# Patient Record
Sex: Female | Born: 1945 | Race: Black or African American | Hispanic: No | Marital: Married | Smoking: Former smoker
Health system: Southern US, Community
[De-identification: ages and names within clinical notes are randomized; demographics above are authoritative.]

## PROBLEM LIST (undated history)

## (undated) DIAGNOSIS — Z1509 Genetic susceptibility to other malignant neoplasm: Secondary | ICD-10-CM

## (undated) DIAGNOSIS — Z1501 Genetic susceptibility to malignant neoplasm of breast: Secondary | ICD-10-CM

## (undated) DIAGNOSIS — Z853 Personal history of malignant neoplasm of breast: Secondary | ICD-10-CM

## (undated) DIAGNOSIS — H409 Unspecified glaucoma: Secondary | ICD-10-CM

## (undated) DIAGNOSIS — Z87440 Personal history of urinary (tract) infections: Secondary | ICD-10-CM

## (undated) DIAGNOSIS — E785 Hyperlipidemia, unspecified: Secondary | ICD-10-CM

## (undated) DIAGNOSIS — M8580 Other specified disorders of bone density and structure, unspecified site: Secondary | ICD-10-CM

## (undated) DIAGNOSIS — I10 Essential (primary) hypertension: Secondary | ICD-10-CM

## (undated) HISTORY — PX: OTHER SURGICAL HISTORY: SHX169

## (undated) HISTORY — PX: ECTOPIC PREGNANCY SURGERY: SHX613

## (undated) HISTORY — DX: Other specified disorders of bone density and structure, unspecified site: M85.80

## (undated) HISTORY — DX: Essential (primary) hypertension: I10

## (undated) HISTORY — PX: CATARACT EXTRACTION: SUR2

## (undated) HISTORY — DX: Genetic susceptibility to other malignant neoplasm: Z15.09

## (undated) HISTORY — PX: HAND SURGERY: SHX662

## (undated) HISTORY — DX: Genetic susceptibility to malignant neoplasm of breast: Z15.01

---

## 1966-11-24 HISTORY — PX: DILATION AND CURETTAGE OF UTERUS: SHX78

## 1998-04-04 ENCOUNTER — Encounter: Admission: RE | Admit: 1998-04-04 | Discharge: 1998-07-03 | Payer: Self-pay | Admitting: Gynecology

## 1999-02-08 ENCOUNTER — Other Ambulatory Visit: Admission: RE | Admit: 1999-02-08 | Discharge: 1999-02-08 | Payer: Self-pay | Admitting: Obstetrics and Gynecology

## 1999-11-25 HISTORY — PX: EYE SURGERY: SHX253

## 2000-02-10 ENCOUNTER — Other Ambulatory Visit: Admission: RE | Admit: 2000-02-10 | Discharge: 2000-02-10 | Payer: Self-pay | Admitting: Obstetrics and Gynecology

## 2000-07-06 ENCOUNTER — Encounter (INDEPENDENT_AMBULATORY_CARE_PROVIDER_SITE_OTHER): Payer: Self-pay | Admitting: Specialist

## 2000-07-06 ENCOUNTER — Other Ambulatory Visit: Admission: RE | Admit: 2000-07-06 | Discharge: 2000-07-06 | Payer: Self-pay | Admitting: Obstetrics and Gynecology

## 2000-10-28 ENCOUNTER — Encounter (INDEPENDENT_AMBULATORY_CARE_PROVIDER_SITE_OTHER): Payer: Self-pay

## 2000-10-28 ENCOUNTER — Ambulatory Visit (HOSPITAL_COMMUNITY): Admission: RE | Admit: 2000-10-28 | Discharge: 2000-10-28 | Payer: Self-pay | Admitting: Obstetrics and Gynecology

## 2001-02-10 ENCOUNTER — Other Ambulatory Visit: Admission: RE | Admit: 2001-02-10 | Discharge: 2001-02-10 | Payer: Self-pay | Admitting: Obstetrics and Gynecology

## 2002-05-12 ENCOUNTER — Other Ambulatory Visit: Admission: RE | Admit: 2002-05-12 | Discharge: 2002-05-12 | Payer: Self-pay | Admitting: Obstetrics and Gynecology

## 2002-08-16 ENCOUNTER — Ambulatory Visit (HOSPITAL_COMMUNITY): Admission: RE | Admit: 2002-08-16 | Discharge: 2002-08-16 | Payer: Self-pay | Admitting: Gastroenterology

## 2003-05-17 ENCOUNTER — Other Ambulatory Visit: Admission: RE | Admit: 2003-05-17 | Discharge: 2003-05-17 | Payer: Self-pay | Admitting: Obstetrics and Gynecology

## 2004-05-22 ENCOUNTER — Other Ambulatory Visit: Admission: RE | Admit: 2004-05-22 | Discharge: 2004-05-22 | Payer: Self-pay | Admitting: Obstetrics and Gynecology

## 2005-05-29 ENCOUNTER — Other Ambulatory Visit: Admission: RE | Admit: 2005-05-29 | Discharge: 2005-05-29 | Payer: Self-pay | Admitting: Addiction Medicine

## 2005-08-28 ENCOUNTER — Encounter: Admission: RE | Admit: 2005-08-28 | Discharge: 2005-08-28 | Payer: Self-pay | Admitting: Surgery

## 2005-08-29 ENCOUNTER — Encounter (INDEPENDENT_AMBULATORY_CARE_PROVIDER_SITE_OTHER): Payer: Self-pay | Admitting: *Deleted

## 2005-08-29 ENCOUNTER — Ambulatory Visit (HOSPITAL_COMMUNITY): Admission: RE | Admit: 2005-08-29 | Discharge: 2005-08-29 | Payer: Self-pay | Admitting: Surgery

## 2005-08-29 ENCOUNTER — Ambulatory Visit (HOSPITAL_BASED_OUTPATIENT_CLINIC_OR_DEPARTMENT_OTHER): Admission: RE | Admit: 2005-08-29 | Discharge: 2005-08-30 | Payer: Self-pay | Admitting: Surgery

## 2005-08-29 HISTORY — PX: BREAST SURGERY: SHX581

## 2005-09-04 DIAGNOSIS — C50312 Malignant neoplasm of lower-inner quadrant of left female breast: Secondary | ICD-10-CM

## 2005-09-15 ENCOUNTER — Ambulatory Visit: Payer: Self-pay | Admitting: Oncology

## 2005-10-06 ENCOUNTER — Ambulatory Visit (HOSPITAL_COMMUNITY): Admission: RE | Admit: 2005-10-06 | Discharge: 2005-10-06 | Payer: Self-pay | Admitting: Oncology

## 2005-10-07 ENCOUNTER — Ambulatory Visit (HOSPITAL_BASED_OUTPATIENT_CLINIC_OR_DEPARTMENT_OTHER): Admission: RE | Admit: 2005-10-07 | Discharge: 2005-10-07 | Payer: Self-pay | Admitting: Surgery

## 2005-10-07 ENCOUNTER — Ambulatory Visit (HOSPITAL_COMMUNITY): Admission: RE | Admit: 2005-10-07 | Discharge: 2005-10-07 | Payer: Self-pay | Admitting: Surgery

## 2005-10-13 ENCOUNTER — Ambulatory Visit (HOSPITAL_COMMUNITY): Admission: RE | Admit: 2005-10-13 | Discharge: 2005-10-13 | Payer: Self-pay | Admitting: Oncology

## 2005-10-14 ENCOUNTER — Encounter: Payer: Self-pay | Admitting: Cardiology

## 2005-10-14 ENCOUNTER — Ambulatory Visit: Payer: Self-pay

## 2005-10-14 HISTORY — PX: TRANSTHORACIC ECHOCARDIOGRAM: SHX275

## 2005-11-07 ENCOUNTER — Ambulatory Visit: Payer: Self-pay | Admitting: Oncology

## 2005-11-24 HISTORY — PX: CARPAL TUNNEL RELEASE: SHX101

## 2005-12-23 ENCOUNTER — Ambulatory Visit: Payer: Self-pay | Admitting: Oncology

## 2006-02-10 ENCOUNTER — Ambulatory Visit: Payer: Self-pay | Admitting: Oncology

## 2006-03-31 ENCOUNTER — Ambulatory Visit: Payer: Self-pay | Admitting: Oncology

## 2006-04-02 LAB — CBC WITH DIFFERENTIAL/PLATELET
Basophils Absolute: 0 10*3/uL (ref 0.0–0.1)
Eosinophils Absolute: 0.1 10*3/uL (ref 0.0–0.5)
HGB: 11.9 g/dL (ref 11.6–15.9)
MCV: 90.7 fL (ref 81.0–101.0)
MONO#: 0.5 10*3/uL (ref 0.1–0.9)
NEUT#: 2.8 10*3/uL (ref 1.5–6.5)
RDW: 14.8 % — ABNORMAL HIGH (ref 11.3–14.5)
lymph#: 1 10*3/uL (ref 0.9–3.3)

## 2006-04-02 LAB — COMPREHENSIVE METABOLIC PANEL
Albumin: 4.2 g/dL (ref 3.5–5.2)
BUN: 14 mg/dL (ref 6–23)
CO2: 26 mEq/L (ref 19–32)
Calcium: 9.2 mg/dL (ref 8.4–10.5)
Chloride: 109 mEq/L (ref 96–112)
Glucose, Bld: 75 mg/dL (ref 70–99)
Potassium: 3.7 mEq/L (ref 3.5–5.3)

## 2006-04-24 ENCOUNTER — Ambulatory Visit (HOSPITAL_BASED_OUTPATIENT_CLINIC_OR_DEPARTMENT_OTHER): Admission: RE | Admit: 2006-04-24 | Discharge: 2006-04-24 | Payer: Self-pay | Admitting: Surgery

## 2006-06-03 ENCOUNTER — Other Ambulatory Visit: Admission: RE | Admit: 2006-06-03 | Discharge: 2006-06-03 | Payer: Self-pay | Admitting: Obstetrics and Gynecology

## 2006-06-30 ENCOUNTER — Ambulatory Visit: Payer: Self-pay | Admitting: Oncology

## 2006-07-02 LAB — COMPREHENSIVE METABOLIC PANEL
Albumin: 4.6 g/dL (ref 3.5–5.2)
Alkaline Phosphatase: 50 U/L (ref 39–117)
BUN: 15 mg/dL (ref 6–23)
Calcium: 9.5 mg/dL (ref 8.4–10.5)
Glucose, Bld: 88 mg/dL (ref 70–99)
Potassium: 3.9 mEq/L (ref 3.5–5.3)

## 2006-07-02 LAB — CBC WITH DIFFERENTIAL/PLATELET
EOS%: 2.2 % (ref 0.0–7.0)
MCH: 30.1 pg (ref 26.0–34.0)
MCV: 88.7 fL (ref 81.0–101.0)
MONO%: 10.7 % (ref 0.0–13.0)
RBC: 4.08 10*6/uL (ref 3.70–5.32)
RDW: 16.1 % — ABNORMAL HIGH (ref 11.3–14.5)

## 2006-07-02 LAB — CANCER ANTIGEN 27.29: CA 27.29: 23 U/mL (ref 0–39)

## 2006-07-09 ENCOUNTER — Encounter (HOSPITAL_COMMUNITY): Admission: RE | Admit: 2006-07-09 | Discharge: 2006-08-18 | Payer: Self-pay | Admitting: Oncology

## 2006-10-21 ENCOUNTER — Ambulatory Visit: Payer: Self-pay | Admitting: Oncology

## 2006-10-26 LAB — COMPREHENSIVE METABOLIC PANEL
ALT: 22 U/L (ref 0–35)
AST: 19 U/L (ref 0–37)
Albumin: 4.6 g/dL (ref 3.5–5.2)
BUN: 15 mg/dL (ref 6–23)
Calcium: 9.2 mg/dL (ref 8.4–10.5)
Chloride: 106 mEq/L (ref 96–112)
Potassium: 4.2 mEq/L (ref 3.5–5.3)
Total Protein: 7.3 g/dL (ref 6.0–8.3)

## 2006-10-26 LAB — CBC WITH DIFFERENTIAL/PLATELET
BASO%: 0.8 % (ref 0.0–2.0)
HCT: 38.2 % (ref 34.8–46.6)
MCHC: 34.6 g/dL (ref 32.0–36.0)
MONO#: 0.5 10*3/uL (ref 0.1–0.9)
NEUT%: 56.7 % (ref 39.6–76.8)
WBC: 7.1 10*3/uL (ref 3.9–10.0)
lymph#: 2.5 10*3/uL (ref 0.9–3.3)

## 2006-10-26 LAB — CANCER ANTIGEN 27.29: CA 27.29: 28 U/mL (ref 0–39)

## 2006-11-25 ENCOUNTER — Encounter (INDEPENDENT_AMBULATORY_CARE_PROVIDER_SITE_OTHER): Payer: Self-pay | Admitting: Specialist

## 2006-11-25 ENCOUNTER — Encounter: Admission: RE | Admit: 2006-11-25 | Discharge: 2006-11-25 | Payer: Self-pay | Admitting: Oncology

## 2006-11-25 ENCOUNTER — Other Ambulatory Visit: Admission: RE | Admit: 2006-11-25 | Discharge: 2006-11-25 | Payer: Self-pay | Admitting: Interventional Radiology

## 2007-03-16 ENCOUNTER — Ambulatory Visit: Payer: Self-pay | Admitting: Oncology

## 2007-03-19 LAB — COMPREHENSIVE METABOLIC PANEL
AST: 16 U/L (ref 0–37)
Albumin: 4.6 g/dL (ref 3.5–5.2)
BUN: 17 mg/dL (ref 6–23)
Calcium: 9.2 mg/dL (ref 8.4–10.5)
Chloride: 108 mEq/L (ref 96–112)
Potassium: 3.6 mEq/L (ref 3.5–5.3)
Sodium: 142 mEq/L (ref 135–145)
Total Protein: 7.2 g/dL (ref 6.0–8.3)

## 2007-03-19 LAB — CBC WITH DIFFERENTIAL/PLATELET
Basophils Absolute: 0 10*3/uL (ref 0.0–0.1)
EOS%: 1.4 % (ref 0.0–7.0)
Eosinophils Absolute: 0.1 10*3/uL (ref 0.0–0.5)
HGB: 12.5 g/dL (ref 11.6–15.9)
MCH: 30.9 pg (ref 26.0–34.0)
NEUT#: 4.6 10*3/uL (ref 1.5–6.5)
RBC: 4.03 10*6/uL (ref 3.70–5.32)
RDW: 14.4 % (ref 11.3–14.5)
lymph#: 1.8 10*3/uL (ref 0.9–3.3)

## 2007-06-07 ENCOUNTER — Other Ambulatory Visit: Admission: RE | Admit: 2007-06-07 | Discharge: 2007-06-07 | Payer: Self-pay | Admitting: Obstetrics and Gynecology

## 2007-08-05 ENCOUNTER — Encounter: Admission: RE | Admit: 2007-08-05 | Discharge: 2007-08-05 | Payer: Self-pay | Admitting: Oncology

## 2007-09-21 ENCOUNTER — Ambulatory Visit: Payer: Self-pay | Admitting: Oncology

## 2007-09-23 LAB — CBC WITH DIFFERENTIAL/PLATELET
EOS%: 2 % (ref 0.0–7.0)
Eosinophils Absolute: 0.1 10*3/uL (ref 0.0–0.5)
LYMPH%: 29.1 % (ref 14.0–48.0)
MCH: 30.7 pg (ref 26.0–34.0)
MCHC: 34.5 g/dL (ref 32.0–36.0)
MCV: 88.9 fL (ref 81.0–101.0)
MONO%: 9.2 % (ref 0.0–13.0)
NEUT#: 3.8 10*3/uL (ref 1.5–6.5)
Platelets: 187 10*3/uL (ref 145–400)
RBC: 4.21 10*6/uL (ref 3.70–5.32)

## 2007-09-23 LAB — COMPREHENSIVE METABOLIC PANEL
Alkaline Phosphatase: 57 U/L (ref 39–117)
BUN: 12 mg/dL (ref 6–23)
Glucose, Bld: 97 mg/dL (ref 70–99)
Sodium: 143 mEq/L (ref 135–145)
Total Bilirubin: 0.6 mg/dL (ref 0.3–1.2)
Total Protein: 7 g/dL (ref 6.0–8.3)

## 2007-09-23 LAB — CANCER ANTIGEN 27.29: CA 27.29: 24 U/mL (ref 0–39)

## 2008-03-20 ENCOUNTER — Ambulatory Visit: Payer: Self-pay | Admitting: Oncology

## 2008-03-22 LAB — CBC WITH DIFFERENTIAL/PLATELET
Eosinophils Absolute: 0.1 10*3/uL (ref 0.0–0.5)
HCT: 37 % (ref 34.8–46.6)
HGB: 12.7 g/dL (ref 11.6–15.9)
LYMPH%: 31.2 % (ref 14.0–48.0)
MONO#: 0.6 10*3/uL (ref 0.1–0.9)
NEUT#: 4.1 10*3/uL (ref 1.5–6.5)
Platelets: 203 10*3/uL (ref 145–400)
RBC: 4.19 10*6/uL (ref 3.70–5.32)
WBC: 7 10*3/uL (ref 3.9–10.0)

## 2008-03-23 LAB — COMPREHENSIVE METABOLIC PANEL
Albumin: 4.5 g/dL (ref 3.5–5.2)
CO2: 24 mEq/L (ref 19–32)
Glucose, Bld: 85 mg/dL (ref 70–99)
Sodium: 141 mEq/L (ref 135–145)
Total Bilirubin: 0.6 mg/dL (ref 0.3–1.2)
Total Protein: 7.2 g/dL (ref 6.0–8.3)

## 2008-03-23 LAB — CANCER ANTIGEN 27.29: CA 27.29: 34 U/mL (ref 0–39)

## 2008-03-23 LAB — VITAMIN D 25 HYDROXY (VIT D DEFICIENCY, FRACTURES): Vit D, 25-Hydroxy: 31 ng/mL (ref 30–89)

## 2008-06-07 ENCOUNTER — Other Ambulatory Visit: Admission: RE | Admit: 2008-06-07 | Discharge: 2008-06-07 | Payer: Self-pay | Admitting: Obstetrics and Gynecology

## 2008-07-17 ENCOUNTER — Other Ambulatory Visit: Admission: RE | Admit: 2008-07-17 | Discharge: 2008-07-17 | Payer: Self-pay | Admitting: Obstetrics and Gynecology

## 2008-10-16 ENCOUNTER — Ambulatory Visit: Payer: Self-pay | Admitting: Oncology

## 2008-10-16 LAB — CBC WITH DIFFERENTIAL/PLATELET
Basophils Absolute: 0 10*3/uL (ref 0.0–0.1)
Eosinophils Absolute: 0.1 10*3/uL (ref 0.0–0.5)
HCT: 38.7 % (ref 34.8–46.6)
HGB: 13.3 g/dL (ref 11.6–15.9)
MCV: 89.9 fL (ref 81.0–101.0)
NEUT#: 5 10*3/uL (ref 1.5–6.5)
NEUT%: 65.2 % (ref 39.6–76.8)
RDW: 14.3 % (ref 11.3–14.5)
lymph#: 1.9 10*3/uL (ref 0.9–3.3)

## 2008-10-17 LAB — COMPREHENSIVE METABOLIC PANEL
ALT: 17 U/L (ref 0–35)
AST: 18 U/L (ref 0–37)
Albumin: 4.9 g/dL (ref 3.5–5.2)
Alkaline Phosphatase: 50 U/L (ref 39–117)
BUN: 17 mg/dL (ref 6–23)
CO2: 25 mEq/L (ref 19–32)
Calcium: 9.6 mg/dL (ref 8.4–10.5)
Chloride: 107 mEq/L (ref 96–112)
Creatinine, Ser: 0.85 mg/dL (ref 0.40–1.20)
Glucose, Bld: 93 mg/dL (ref 70–99)
Potassium: 3.6 mEq/L (ref 3.5–5.3)
Sodium: 144 mEq/L (ref 135–145)
Total Bilirubin: 0.6 mg/dL (ref 0.3–1.2)
Total Protein: 7.9 g/dL (ref 6.0–8.3)

## 2008-10-17 LAB — CANCER ANTIGEN 27.29: CA 27.29: 36 U/mL (ref 0–39)

## 2008-10-17 LAB — VITAMIN D 25 HYDROXY (VIT D DEFICIENCY, FRACTURES): Vit D, 25-Hydroxy: 48 ng/mL (ref 30–89)

## 2008-10-17 LAB — LACTATE DEHYDROGENASE: LDH: 207 U/L (ref 94–250)

## 2009-04-25 ENCOUNTER — Ambulatory Visit: Payer: Self-pay | Admitting: Oncology

## 2009-04-27 LAB — CBC WITH DIFFERENTIAL/PLATELET
Basophils Absolute: 0 10*3/uL (ref 0.0–0.1)
EOS%: 1.2 % (ref 0.0–7.0)
HCT: 35.4 % (ref 34.8–46.6)
HGB: 12.1 g/dL (ref 11.6–15.9)
MCH: 29.6 pg (ref 25.1–34.0)
MCV: 86.6 fL (ref 79.5–101.0)
MONO%: 8.4 % (ref 0.0–14.0)
NEUT%: 54.1 % (ref 38.4–76.8)

## 2009-04-30 LAB — CANCER ANTIGEN 27.29: CA 27.29: 37 U/mL (ref 0–39)

## 2009-04-30 LAB — COMPREHENSIVE METABOLIC PANEL
Albumin: 4.5 g/dL (ref 3.5–5.2)
Alkaline Phosphatase: 44 U/L (ref 39–117)
BUN: 15 mg/dL (ref 6–23)
CO2: 26 mEq/L (ref 19–32)
Glucose, Bld: 77 mg/dL (ref 70–99)
Potassium: 3.5 mEq/L (ref 3.5–5.3)

## 2009-04-30 LAB — LACTATE DEHYDROGENASE: LDH: 214 U/L (ref 94–250)

## 2009-04-30 LAB — VITAMIN D 25 HYDROXY (VIT D DEFICIENCY, FRACTURES): Vit D, 25-Hydroxy: 46 ng/mL (ref 30–89)

## 2009-06-11 ENCOUNTER — Other Ambulatory Visit: Admission: RE | Admit: 2009-06-11 | Discharge: 2009-06-11 | Payer: Self-pay | Admitting: Obstetrics and Gynecology

## 2009-06-11 ENCOUNTER — Encounter: Payer: Self-pay | Admitting: Obstetrics and Gynecology

## 2009-06-11 ENCOUNTER — Ambulatory Visit: Payer: Self-pay | Admitting: Obstetrics and Gynecology

## 2009-08-28 ENCOUNTER — Ambulatory Visit: Payer: Self-pay | Admitting: Obstetrics and Gynecology

## 2009-10-11 ENCOUNTER — Ambulatory Visit: Payer: Self-pay | Admitting: Obstetrics and Gynecology

## 2009-10-16 ENCOUNTER — Ambulatory Visit: Payer: Self-pay | Admitting: Gynecology

## 2009-11-15 ENCOUNTER — Ambulatory Visit: Payer: Self-pay | Admitting: Oncology

## 2009-11-20 LAB — CBC WITH DIFFERENTIAL/PLATELET
BASO%: 0.3 % (ref 0.0–2.0)
EOS%: 1.5 % (ref 0.0–7.0)
MCH: 29.6 pg (ref 25.1–34.0)
MCHC: 33.3 g/dL (ref 31.5–36.0)
NEUT%: 49.6 % (ref 38.4–76.8)
RBC: 4.29 10*6/uL (ref 3.70–5.45)
RDW: 14.9 % — ABNORMAL HIGH (ref 11.2–14.5)
lymph#: 2.8 10*3/uL (ref 0.9–3.3)
nRBC: 0 % (ref 0–0)

## 2009-11-21 LAB — VITAMIN D 25 HYDROXY (VIT D DEFICIENCY, FRACTURES): Vit D, 25-Hydroxy: 53 ng/mL (ref 30–89)

## 2009-11-21 LAB — COMPREHENSIVE METABOLIC PANEL
ALT: 22 U/L (ref 0–35)
AST: 19 U/L (ref 0–37)
Albumin: 4.4 g/dL (ref 3.5–5.2)
Alkaline Phosphatase: 48 U/L (ref 39–117)
BUN: 14 mg/dL (ref 6–23)
Potassium: 3.7 mEq/L (ref 3.5–5.3)
Sodium: 142 mEq/L (ref 135–145)

## 2009-12-07 ENCOUNTER — Ambulatory Visit: Payer: Self-pay | Admitting: Obstetrics and Gynecology

## 2010-05-24 ENCOUNTER — Ambulatory Visit: Payer: Self-pay | Admitting: Oncology

## 2010-05-29 LAB — COMPREHENSIVE METABOLIC PANEL
ALT: 18 U/L (ref 0–35)
AST: 18 U/L (ref 0–37)
Albumin: 4.5 g/dL (ref 3.5–5.2)
Alkaline Phosphatase: 49 U/L (ref 39–117)
BUN: 16 mg/dL (ref 6–23)
CO2: 24 mEq/L (ref 19–32)
Calcium: 9.7 mg/dL (ref 8.4–10.5)
Chloride: 105 mEq/L (ref 96–112)
Creatinine, Ser: 0.74 mg/dL (ref 0.40–1.20)
Glucose, Bld: 105 mg/dL — ABNORMAL HIGH (ref 70–99)
Potassium: 3.8 mEq/L (ref 3.5–5.3)
Sodium: 138 mEq/L (ref 135–145)
Total Bilirubin: 0.5 mg/dL (ref 0.3–1.2)
Total Protein: 7.3 g/dL (ref 6.0–8.3)

## 2010-05-29 LAB — CBC WITH DIFFERENTIAL/PLATELET
BASO%: 0.3 % (ref 0.0–2.0)
Basophils Absolute: 0 10*3/uL (ref 0.0–0.1)
EOS%: 1.7 % (ref 0.0–7.0)
Eosinophils Absolute: 0.1 10*3/uL (ref 0.0–0.5)
HCT: 37.4 % (ref 34.8–46.6)
HGB: 12.8 g/dL (ref 11.6–15.9)
LYMPH%: 38.9 % (ref 14.0–49.7)
MCH: 30.1 pg (ref 25.1–34.0)
MCHC: 34.2 g/dL (ref 31.5–36.0)
MCV: 88 fL (ref 79.5–101.0)
MONO#: 0.7 10*3/uL (ref 0.1–0.9)
MONO%: 9.3 % (ref 0.0–14.0)
NEUT#: 3.6 10*3/uL (ref 1.5–6.5)
NEUT%: 49.8 % (ref 38.4–76.8)
Platelets: 205 10*3/uL (ref 145–400)
RBC: 4.25 10*6/uL (ref 3.70–5.45)
RDW: 14.8 % — ABNORMAL HIGH (ref 11.2–14.5)
WBC: 7.2 10*3/uL (ref 3.9–10.3)
lymph#: 2.8 10*3/uL (ref 0.9–3.3)

## 2010-05-29 LAB — VITAMIN D 25 HYDROXY (VIT D DEFICIENCY, FRACTURES): Vit D, 25-Hydroxy: 50 ng/mL (ref 30–89)

## 2010-05-29 LAB — CANCER ANTIGEN 27.29: CA 27.29: 30 U/mL (ref 0–39)

## 2010-05-29 LAB — LACTATE DEHYDROGENASE: LDH: 177 U/L (ref 94–250)

## 2010-06-12 ENCOUNTER — Ambulatory Visit: Payer: Self-pay | Admitting: Obstetrics and Gynecology

## 2010-06-12 ENCOUNTER — Other Ambulatory Visit: Admission: RE | Admit: 2010-06-12 | Discharge: 2010-06-12 | Payer: Self-pay | Admitting: Obstetrics and Gynecology

## 2010-11-21 ENCOUNTER — Ambulatory Visit: Payer: Self-pay | Admitting: Oncology

## 2010-11-27 LAB — CBC WITH DIFFERENTIAL/PLATELET
BASO%: 0.5 % (ref 0.0–2.0)
Basophils Absolute: 0 10*3/uL (ref 0.0–0.1)
EOS%: 2.3 % (ref 0.0–7.0)
Eosinophils Absolute: 0.2 10*3/uL (ref 0.0–0.5)
HCT: 36.2 % (ref 34.8–46.6)
HGB: 12.4 g/dL (ref 11.6–15.9)
LYMPH%: 31.1 % (ref 14.0–49.7)
MCH: 31 pg (ref 25.1–34.0)
MCHC: 34.3 g/dL (ref 31.5–36.0)
MCV: 90.1 fL (ref 79.5–101.0)
MONO#: 0.6 10*3/uL (ref 0.1–0.9)
MONO%: 9.4 % (ref 0.0–14.0)
NEUT#: 3.7 10*3/uL (ref 1.5–6.5)
NEUT%: 56.7 % (ref 38.4–76.8)
Platelets: 191 10*3/uL (ref 145–400)
RBC: 4.02 10*6/uL (ref 3.70–5.45)
RDW: 15.5 % — ABNORMAL HIGH (ref 11.2–14.5)
WBC: 6.6 10*3/uL (ref 3.9–10.3)
lymph#: 2 10*3/uL (ref 0.9–3.3)

## 2010-11-28 LAB — COMPREHENSIVE METABOLIC PANEL
ALT: 28 U/L (ref 0–35)
AST: 23 U/L (ref 0–37)
Albumin: 4.5 g/dL (ref 3.5–5.2)
Alkaline Phosphatase: 53 U/L (ref 39–117)
BUN: 12 mg/dL (ref 6–23)
CO2: 26 mEq/L (ref 19–32)
Calcium: 9.2 mg/dL (ref 8.4–10.5)
Chloride: 105 mEq/L (ref 96–112)
Creatinine, Ser: 0.77 mg/dL (ref 0.40–1.20)
Glucose, Bld: 83 mg/dL (ref 70–99)
Potassium: 4.1 mEq/L (ref 3.5–5.3)
Sodium: 140 mEq/L (ref 135–145)
Total Bilirubin: 0.5 mg/dL (ref 0.3–1.2)
Total Protein: 7.5 g/dL (ref 6.0–8.3)

## 2010-11-28 LAB — VITAMIN D 25 HYDROXY (VIT D DEFICIENCY, FRACTURES): Vit D, 25-Hydroxy: 49 ng/mL (ref 30–89)

## 2010-11-28 LAB — CANCER ANTIGEN 27.29: CA 27.29: 35 U/mL (ref 0–39)

## 2010-11-28 LAB — LACTATE DEHYDROGENASE: LDH: 183 U/L (ref 94–250)

## 2010-12-05 ENCOUNTER — Ambulatory Visit
Admission: RE | Admit: 2010-12-05 | Discharge: 2010-12-05 | Payer: Self-pay | Source: Home / Self Care | Attending: Pulmonary Disease | Admitting: Pulmonary Disease

## 2010-12-05 DIAGNOSIS — R0609 Other forms of dyspnea: Secondary | ICD-10-CM | POA: Insufficient documentation

## 2010-12-05 DIAGNOSIS — J309 Allergic rhinitis, unspecified: Secondary | ICD-10-CM | POA: Insufficient documentation

## 2010-12-05 DIAGNOSIS — I10 Essential (primary) hypertension: Secondary | ICD-10-CM | POA: Insufficient documentation

## 2010-12-05 DIAGNOSIS — R0989 Other specified symptoms and signs involving the circulatory and respiratory systems: Secondary | ICD-10-CM

## 2010-12-05 DIAGNOSIS — N3090 Cystitis, unspecified without hematuria: Secondary | ICD-10-CM | POA: Insufficient documentation

## 2010-12-05 DIAGNOSIS — E785 Hyperlipidemia, unspecified: Secondary | ICD-10-CM | POA: Insufficient documentation

## 2010-12-25 ENCOUNTER — Ambulatory Visit (HOSPITAL_BASED_OUTPATIENT_CLINIC_OR_DEPARTMENT_OTHER): Payer: Medicare Other | Attending: Pulmonary Disease

## 2010-12-25 ENCOUNTER — Encounter: Payer: Self-pay | Admitting: Pulmonary Disease

## 2010-12-25 DIAGNOSIS — G4710 Hypersomnia, unspecified: Secondary | ICD-10-CM | POA: Insufficient documentation

## 2010-12-25 DIAGNOSIS — R259 Unspecified abnormal involuntary movements: Secondary | ICD-10-CM | POA: Insufficient documentation

## 2010-12-26 NOTE — Assessment & Plan Note (Signed)
Summary: consult for possible osa   Copy to:  Pierce Crane Primary Provider/Referring Provider:  Darral Dash  CC:  Sleep Consult.  History of Present Illness: The pt is a 65y/o female who I have been asked to see for possible osa.  She has been noted to have loud snoring, but unsure if she has any type of breathing issues during sleep.  She denies any choking arousals during the night.  She goes to bed around 11pm, and arises at 6am to start her day.  She feels rested in the am's upon arising.  She does note fragmented sleep though with frequent awakenings.  She has noted abnormal sleepiness during the day with periods of inactivity.  She can easily doze with reading or watching tv.  She denies any sleepiness driving.  She states that her weight is neutral over the last few years, and her epworth score today is 8.  Preventive Screening-Counseling & Management  Alcohol-Tobacco     Smoking Status: quit  Current Medications (verified): 1)  Hyzaar 50-12.5 Mg Tabs (Losartan Potassium-Hctz) .... Take 1 Tablet By Mouth Once A Day 2)  Lipitor 10 Mg Tabs (Atorvastatin Calcium) .... Take 1 Tablet By Mouth Once A Day 3)  Vitamin D 1000 Unit Tabs (Cholecalciferol) .... Take 1 Tablet By Mouth Once A Day 4)  Caltrate 600+d 600-400 Mg-Unit Tabs (Calcium Carbonate-Vitamin D) .... Take 1 Tablet By Mouth Once A Day 5)  Xalatan 0.005 % Soln (Latanoprost) .Marland Kitchen.. 1 Drop in Each Eye At Bedtime 6)  Alphagan P 0.1 % Soln (Brimonidine Tartrate) .Marland Kitchen.. 1 Drop in Each Eye Three Times A Day 7)  Cosopt 22.3-6.8 Mg/ml Soln (Dorzolamide Hcl-Timolol Mal) .Marland Kitchen.. 1 Drop in Each Eye Two Times A Day 8)  Femara 2.5 Mg Tabs (Letrozole) .... 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 allergies: sisters asthma: pgm heart disease: mother (m.i.) cancer: 3 sisters (breast) father (colon)  mother: brain aneurysm  Social History: Reviewed history and no changes required. Patient states former smoker. started as a teenager.  less than 1 ppd.  quit in her late 78s. pt is married and lives with husband. pt has 3 adopted children. pt is a housewife.  Smoking Status:  quit  Review of Systems       The patient complains of headaches.  The patient denies shortness of breath with activity, shortness of breath at rest, productive cough, non-productive cough, coughing up blood, chest pain, irregular heartbeats, acid heartburn, indigestion, loss of appetite, weight change, abdominal pain, difficulty swallowing, sore throat, tooth/dental problems, nasal congestion/difficulty breathing through nose, sneezing, itching, ear ache, anxiety, depression, hand/feet swelling, joint stiffness or pain, rash, change in color of mucus, and fever.    Vital Signs:  Patient profile:   65 year old female Height:      65 inches Weight:      147.38 pounds BMI:     24.61 O2 Sat:      95 % on Room air Temp:     98.4 degrees F oral Pulse rate:   69 / minute BP sitting:  122 / 74  (left arm) Cuff size:   large  Vitals Entered By: Arman Filter LPN (December 05, 2010 11:20 AM)  O2 Flow:  Room air CC: Sleep Consult Comments Medications reviewed with patient Arman Filter LPN  December 05, 2010 11:36 AM    Physical Exam  General:  wd female in nad Eyes:  PERRLA and EOMI.   Nose:  deviated septum to left with mild narrowing Mouth:  elongation of soft palate and normal uvula Neck:  no jvd, tmg, LN Lungs:  totally clear to auscultation Heart:  rrr, no mrg Abdomen:  soft and nontender,  bs+ Extremities:  no significant edema, pulses intact distally no cyanosis  Neurologic:  alert and oriented, moves all 4.   Impression & Recommendations:  Problem # 1:  SNORING (ICD-786.09) the pt has loud snoring that is disruptive to others, but is also having fragmented sleep and definite inappropriate daytime sleepiness with periods of inactivity.  It is unclear if this is symptomatic snoring, UARS, or frank osa.  I have had a long discussion with the pt about sleep apnea, including its impact on QOL and CV health.  In order to put this issue to rest, she will need a sleep study for diagnosis.  The pt agrees to proceed.  Medications Added to Medication List This Visit: 1)  Hyzaar 50-12.5 Mg Tabs (Losartan potassium-hctz) .... Take 1 tablet by mouth once a day 2)  Lipitor 10 Mg Tabs (Atorvastatin calcium) .... Take 1 tablet by mouth once a day 3)  Vitamin D 1000 Unit Tabs (Cholecalciferol) .... Take 1 tablet by mouth once a day 4)  Caltrate 600+d 600-400 Mg-unit Tabs (Calcium carbonate-vitamin d) .... Take 1 tablet by mouth once a day 5)  Xalatan 0.005 % Soln (Latanoprost) .Marland Kitchen.. 1 drop in each eye at bedtime 6)  Alphagan P 0.1 % Soln (Brimonidine tartrate) .Marland Kitchen.. 1 drop in each eye three times a day 7)  Cosopt 22.3-6.8 Mg/ml Soln (Dorzolamide hcl-timolol mal) .Marland Kitchen.. 1 drop in each eye two times a day 8)  Femara 2.5 Mg Tabs (Letrozole) .... Take 1 tablet by mouth once a day  Other Orders: Consultation Level IV (16109) Sleep Disorder Referral (Sleep Disorder)  Patient Instructions: 1)  will set you up for a sleep study, and will arrange followup once results are available

## 2011-01-02 DIAGNOSIS — G4710 Hypersomnia, unspecified: Secondary | ICD-10-CM

## 2011-01-02 DIAGNOSIS — R259 Unspecified abnormal involuntary movements: Secondary | ICD-10-CM

## 2011-01-02 DIAGNOSIS — G4730 Sleep apnea, unspecified: Secondary | ICD-10-CM

## 2011-01-09 ENCOUNTER — Encounter: Payer: Self-pay | Admitting: Pulmonary Disease

## 2011-01-09 ENCOUNTER — Ambulatory Visit (INDEPENDENT_AMBULATORY_CARE_PROVIDER_SITE_OTHER): Payer: Medicare Other | Admitting: Pulmonary Disease

## 2011-01-09 DIAGNOSIS — R0989 Other specified symptoms and signs involving the circulatory and respiratory systems: Secondary | ICD-10-CM

## 2011-01-15 NOTE — Assessment & Plan Note (Signed)
Summary: ov to discuss sleep study results/mg   Copy to:  Pierce Crane Primary Provider/Referring Provider:  Darral Dash  CC:  Ov to discuss sleep study results. .  History of Present Illness: the pt comes in today for f/u of her recent sleep study.  She was found to have small numbers of apneas and hypopneas that did not meet the criteria for OSA syndrome.  She did have moderate numbers of leg jerks, but not a lot of sleep disruption.  She denies any symptoms of RLS or PLMD.  I have reviewed her study with her in detail, and answered all of her questions.  Medications Prior to Update: 1)  Hyzaar 50-12.5 Mg Tabs (Losartan Potassium-Hctz) .... Take 1 Tablet By Mouth Once A Day 2)  Lipitor 10 Mg Tabs (Atorvastatin Calcium) .... Take 1 Tablet By Mouth Once A Day 3)  Vitamin D 1000 Unit Tabs (Cholecalciferol) .... Take 1 Tablet By Mouth Once A Day 4)  Caltrate 600+d 600-400 Mg-Unit Tabs (Calcium Carbonate-Vitamin D) .... Take 1 Tablet By Mouth Once A Day 5)  Xalatan 0.005 % Soln (Latanoprost) .Marland Kitchen.. 1 Drop in Each Eye At Bedtime 6)  Alphagan P 0.1 % Soln (Brimonidine Tartrate) .Marland Kitchen.. 1 Drop in Each Eye Three Times A Day 7)  Cosopt 22.3-6.8 Mg/ml Soln (Dorzolamide Hcl-Timolol Mal) .Marland Kitchen.. 1 Drop in Each Eye Two Times A Day 8)  Femara 2.5 Mg Tabs (Letrozole) .... Take 1 Tablet By Mouth Once A Day  Allergies (verified): No Known Drug Allergies  Review of Systems  The patient denies shortness of breath with activity, shortness of breath at rest, productive cough, non-productive cough, coughing up blood, chest pain, irregular heartbeats, acid heartburn, indigestion, loss of appetite, weight change, abdominal pain, difficulty swallowing, sore throat, tooth/dental problems, headaches, nasal congestion/difficulty breathing through nose, sneezing, itching, ear ache, anxiety, depression, hand/feet swelling, joint stiffness or pain, rash, change in color of mucus, and fever.    Vital Signs:  Patient profile:   65  year old female Height:      65 inches Weight:      147.38 pounds BMI:     24.61 O2 Sat:      98 % on Room air Temp:     98.1 degrees F oral Pulse rate:   65 / minute BP sitting:   118 / 70  (left arm) Cuff size:   regular  Vitals Entered By: Arman Filter LPN (January 09, 2011 9:01 AM)  O2 Flow:  Room air CC: Ov to discuss sleep study results.  Comments Medications reviewed with patient Arman Filter LPN  January 09, 2011 9:01 AM    Physical Exam  General:  wd female in nad Extremities:  no edema or cyanosis Neurologic:  alert and oriented, moves all 4.  Appears mildly sleepy   Impression & Recommendations:  Problem # 1:  SNORING (ICD-786.09)  the pt did not have sleep apnea on her recent sleep study, but did have significant snoring.  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  If she continues to have issues and wishes to approach this more aggressively, I would consider a dental appliance.  The pt also had moderate numbers of leg jerks during the night, but she has no history suggestive of RLS or PLMD.    Other Orders: Est. Patient Level III (14782)  Patient Instructions: 1)  work on 20 pound weight loss, stay off your back while sleeping. 2)  if you feel your symptoms are worsening, can consider a dental appliance.  Let me know 3)  followup as needed.

## 2011-03-06 ENCOUNTER — Encounter: Payer: Self-pay | Admitting: Pulmonary Disease

## 2011-04-11 NOTE — Op Note (Signed)
   NAME:  [name], [name]                      ACCOUNT NO.:  [id number]   MEDICAL RECORD NO.:  [id number]                   PATIENT TYPE:  AMB   LOCATION:  ENDO                                 FACILITY:  [facility]   PHYSICIAN:  Florencia Reasons, M.D.             DATE OF BIRTH:  [birth year]   DATE OF PROCEDURE:  08/16/2002  DATE OF DISCHARGE:                                 OPERATIVE REPORT   PROCEDURE:  Colonoscopy.   INDICATIONS:  Screening for colon cancer in a 65 year old female with a  family history of colon cancer in her father.   FINDINGS:  Sigmoid fixation and angulation, otherwise normal exam to the  cecum.   DESCRIPTION OF PROCEDURE:  The nature, purpose, and risks of the procedure  were familiar to the patient from prior examination, and she provided  written consent.  The Olympus adjustable-tension pediatric video colonoscope  was advanced with considerable difficulty to a very fixated and somewhat  angulated sigmoid region, perhaps due to adhesions from the patient's  previous ectopic pregnancies.  Ultimately we were able to reach the cecum as  identified by very clear visualization of the ileocecal valve, allowing me  to nub the tip of the scope into the orifice of the terminal ileum.  Pullback was then performed.   The quality of the prep is excellent, and it is felt that all areas were  adequately seen.   This was a normal examination except for the presence of the sigmoid  fixation and angulation.  Specifically, no polyps, cancer, colitis, vascular  malformations, or diverticulosis were noted.  Retroflexion in the rectum as  well as reinspection of the rectum and lower sigmoid was unremarkable.   No biopsies were obtained.  The patient tolerated the procedure well, and  there were no apparent complications.   IMPRESSION:  Family history of colon cancer, without worrisome findings on  today's exam.   PLAN:  Follow-up colonoscopy in five years in view of the  family history.                                               Florencia Reasons, M.D.    RVB/MEDQ  D:  08/16/2002  T:  08/17/2002  Job:  [id number]   cc:   Gaspar Garbe, M.D.

## 2011-04-11 NOTE — Op Note (Signed)
[name], [name]            ACCOUNT NO.:  [id number]   MEDICAL RECORD NO.:  [id number]          PATIENT TYPE:  AMB   LOCATION:  DSC                          FACILITY:  [facility]   PHYSICIAN:  Currie Paris, M.D.DATE OF BIRTH:  09-04-1946   DATE OF PROCEDURE:  10/07/2005  DATE OF DISCHARGE:                                 OPERATIVE REPORT   PREOPERATIVE DIAGNOSES:  1.  Breast cancer.  2.  Inadequate intravenous access.   POSTOPERATIVE DIAGNOSES:  1.  Breast cancer.  2.  Inadequate intravenous access.   OPERATION/PROCEDURE:  Placement of Port-A-Cath.   SURGEON:  Currie Paris, M.D.   ANESTHESIA:  MAC.   CLINICAL HISTORY:  This is a 65 year old lady getting ready to have  chemotherapy for her breast cancer. She needed better IV access.   DESCRIPTION OF PROCEDURE:  The patient was seen in the holding area and we  reviewed the plans for Port-A-Cath placement including risks and  complications.  She and her husband had no further questions.  The patient  was taken to the operating room and after satisfactory IV sedation, was  placed in Trendelenburg and the upper chest and lower neck prepped and  draped as a sterile field.  The time out occurred.   I infiltrated 1% Xylocaine under the right infraclavicular fossa and on the  first attempt to enter the subclavian vein, with good flow back, the  guidewire was threaded easily and advanced into the right atrium with  fluoroscopic control.   The patient had  local infiltrated over the anterior chest wall.  A  transverse incision was made and the subcutaneous pocket fashioned with a  cautery for the reservoir.  The area from that point to the guidewire entry  site was anesthetized.  The Port-A-Cath tubing was brought through a  subcutaneous tunnel, entering in the reservoir site and exiting out the  guidewire site.   The guidewire tract was then dilated once with the dilator peel-away sheath.  This went easily.  The  dilator and guidewire were removed and the Port-A-  Cath tubing threaded to approximately 22 cm.  The peel-away sheath was then  removed.  The catheter aspirated and irrigated easily.  Using fluoroscope,  we could see that we were in the right atrium so this was backed up to 14 cm  where I appeared to be near the junction of the right atrium and superior  vena cava.  This aspirated and irrigated easily.   The reservoir was flushed, attached and locking mechanism engaged.  It  aspirated and irrigated easily.  It was secured to the fascia with some 3-0  Vicryls.   Final check with the fluoroscopy was made and we seemed to have no kinking,  good positioning.  The cavity was aspirated, flushed with dilute heparin,  followed by 5 mL of concentrated aqueous heparin.   The incision was closed with 3-0 Vicryl, 4-0 Monocryl subcuticular, and  Dermabond.   The patient tolerated the procedure well.  There were no operative  complications.  All counts were correct.      Currie Paris,  M.D.  Electronically Signed     CJS/MEDQ  D:  10/07/2005  T:  10/07/2005  Job:  [id number]

## 2011-04-11 NOTE — Op Note (Signed)
[facility]  PatientLONETTE, [name]                      MRN: [id number] Proc. Date: 10/28/00 Attending:  Rande Brunt. Eda Paschal, M.D.                           Operative Report  PREOPERATIVE DIAGNOSIS:       Persistent postmenopausal bleeding with                               intrauterine cavity defect.  POSTOPERATIVE DIAGNOSES:      1. Persistent postmenopausal bleeding.                               2. Endometrial polyp.  OPERATION:                    Hysteroscopy D&C.  SURGEON:                      Daniel L. Eda Paschal, M.D.  ASSISTANT:  ANESTHESIA:                   General anesthesia.  ESTIMATED BLOOD LOSS:         Less than 50 cc.  INDICATIONS:                  The patient is a 65 year old female who is a gravida 2, para 0, AB 2 who had presented to the office with persistent postmenopausal spotting.  Ultrasound revealed enlargement of the endometrial canal consistent with either a small submucous myoma or an endometrial polyp. She enters the hospital now for a hysteroscopy and excision of the above.  FINDINGS:                     External and vaginal is within normal limits. Cervix is clean.  Uterus is anteverted, normal size and shape.  Adnexa are not palpably enlarged.  At the time of hysteroscopy the patient had a 1 to 2 cm endometrial polyp coming off the posterior wall of the fundus.  After this had been removed, the top of the fundus, tubal ostia, anterior and posterior walls of the fundus, lower uterine segment, and endocervical canal were all free of disease.  DESCRIPTION OF PROCEDURE:     After adequate general anesthesia, the patient was placed in the dorsal lithotomy position, prepped and draped in usual sterile manner.  Single-tooth tenaculum was placed in the anterior lip of the cervix and the cervix was progressively dilated with Shawnie Pons dilators.  By the time we got to the #29 [facility] dilators.  By the time we got to  the #29 [facility] dilator, we were having trouble dilating the cervix because of cervical stenosis, her postmenopausal status and the fact that she had had no vaginal deliveries.  She was able to be dilated to a #31 Pratt dilator but was not able to be dilated further than that.  An attempt was made to hysteroscope her with the hysteroscopic resectoscope so that the polyp could be resected but this could not be comfortably inserted and it could not get past the cervix so this was stopped.  Instead she was hysteroscoped with an observatory hysteroscope and a polyp was definitely identified.  An attempt was made to remove it with polyp forceps.  A small piece of it could be removed but when she was rehysteroscoped, there was still a significant amount of the polyp left.  It was felt that another attempt had to be made to use the resectoscope, putting on two single-tooth tenaculums at 9 and 3 oclock to try to distribute the tension.  She then was able to be dilated to a #35 Pratt dilator without difficulty.  The hysteroscopic resectoscope was then introduced.  There was no perforation.  The polyp could be well visualized. We used 3% sorbitol to expand the intrauterine cavity as had been used with the observatory scope and a camera was used for magnification. Using a 90 degree wire loop, the rest of the polyp could be resected with ease.  Bovie settings were 70 coagulation, 110 cutting, blend 1 and it was removed with a blend 1 with the surgeon having his foot on the cutting current. The rest of the polyp was removed. There was no bleeding noted.  The cavity was now completely free. The fluid deficit was measured at 110 cc by the nurse and the procedure was terminated. The polyp was sent to pathology for tissue diagnosis. The patient tolerated the procedure well and left the operating room in satisfactory condition.  Estimated blood loss was less than 50 cc. DD:  10/28/00 TD:  10/28/00 Job:  [id number]

## 2011-04-11 NOTE — Op Note (Signed)
[name], [name]            ACCOUNT NO.:  [id number]   MEDICAL RECORD NO.:  [id number]          PATIENT TYPE:  AMB   LOCATION:  DSC                          FACILITY:  [facility]   PHYSICIAN:  Currie Paris, M.D.DATE OF BIRTH:  [birth year]   DATE OF PROCEDURE:  08/29/2005  DATE OF DISCHARGE:                                 OPERATIVE REPORT   OFFICE MEDICAL RECORD NUMBER [id number]   PREOPERATIVE DIAGNOSIS:  Multifocal left breast cancer.   POSTOPERATIVE DIAGNOSIS:  Multifocal left breast cancer.   OPERATION:  Left total mastectomy with blue dye injection and axillary  sentinel lymph node biopsy (4 nodes removed).   SURGEON:  Currie Paris, M.D.   ASSISTANT:  Lebron Conners, M.D.   ANESTHESIA:  General.   CLINICAL HISTORY:  This is a 65 year old lady recently found to have 2  breast cancers in separate quadrants.  After discussion of the options with  the patient, she elected to proceed to total mastectomy.  She did not wish  immediate reconstruction.  We planned for sentinel node evaluation at this  time.   DESCRIPTION OF PROCEDURE:  The patient was seen in the holding area and she  had no further questions.  She already had her left breast injected with  radioactive isotope when I saw her.  We confirmed left mastectomy with  sentinel node evaluation was the planned procedure and I initialed the left  breast as the operative site.   The patient was then taken to the operating room and after satisfactory  general anesthesia had been obtained, I prepped the nipple-areolar complex  and we did the time-out.  I injected 5 mL of methylene blue subareolarly.  This was massaged in.  The breast was then prepped with Betadine and draped  as a sterile field.   I outlined an elliptical incision.  I used the Neoprobe and marked a hot  area in the axilla.   I made the skin incision and then raised a superior skin flap with the  cautery, going medially to the sternum and  superiorly to the clavicle, and  then laterally into the axilla.  When I had elevated the axillary skin off  the area that I had marked as the site of the hot node, I did some trial  dissections and a blue lymphatic which traced almost immediately into a  small cluster of 2 blue lymph nodes, both of which had counts of about 700.  These were separated out and sent to Pathology.   Using the Neoprobe, I found another hot area and further dissection showed  another node which did not have any blue in it, but was clearly hot with  counts of around 600 and this was removed.  A fourth node was likewise  found.  Again, it was not blue, just hot, and was removed.   With these 4 out, I felt no other enlarged nodes, saw no other blue  lymphatics or blue lymph nodes, and all counts were down to a background of  about 0-10.  I placed a pack and we completed the mastectomy.   The  inferior incision was skin-flap-raised to beyond the inframammary fold  and rectus sheath and laterally out to the latissimus.  The breast was  removed from the underlying muscle, taking the fascia, starting medially  working laterally, with the final attachments to the serratus, the anterior  edge of the latissimus and pectoralis all divided with the cautery.   We made sure everything was dry, irrigating.  There were some thickened  flaps with some extra fat that I trimmed off and we sent with the specimen.   Once everything appeared to be dry place, I placed two 19 Blake drains and  secured them with 2-0 nylons.  We did a final check for hemostasis and  everything had remained dry while we had been placing drains and irrigating.   The incision was closed with staples.  At this point, Pathology reported  that the 4 sentinel lymph nodes were negative.   I placed some Marcaine under the flaps, using about 30 mL, and left it in  place for about 10 minutes, then hooked the drains to suction.  Sterile  dressings were  applied.   The patient tolerated the procedure well.  There were no operative  complications.  All counts were correct.      Currie Paris, M.D.  Electronically Signed     CJS/MEDQ  D:  08/29/2005  T:  08/29/2005  Job:  [id number]   cc:   Gaspar Garbe, M.D.  Fax: [phone]

## 2011-04-11 NOTE — Op Note (Signed)
NAMECHENE, KASINGER            ACCOUNT NO.:  [id number]   MEDICAL RECORD NO.:  [id number]          PATIENT TYPE:  AMB   LOCATION:  DSC                          FACILITY:  [facility]   PHYSICIAN:  Currie Paris, M.D.DATE OF BIRTH:  12-10-1945   DATE OF PROCEDURE:  04/24/2006  DATE OF DISCHARGE:                                 OPERATIVE REPORT   PREOPERATIVE DIAGNOSIS:  Unneeded Port-A-Cath status post chemotherapy for  breast cancer.   POSTOPERATIVE DIAGNOSIS:  Unneeded Port-A-Cath status post chemotherapy for  breast cancer.   OPERATION:  Removal Port-A-Cath.   SURGEON:  Dr. Jamey Ripa.   ANESTHESIA:  Local.   CLINICAL HISTORY:  Ms. Leckey has completed her chemotherapy and wished to  have her Port-A-Cath removed.   DESCRIPTION OF PROCEDURE:  The patient brought into the minor procedure  room, and we confirmed the location the port and the procedure.   The area of the port was then injected with 1% Xylocaine with epinephrine.  I allowed that for 10 minutes for good effect.   The area was then prepped with Betadine and draped as a sterile field.  The  old scar was opened.  Port-A-Cath was identified in its capsule, and the  capsule was opened.  Tubing was backed out, and there was no back bleeding.  The catheter and tubing came out intact.   Everything appeared to be dry.  Incision was closed in layers with 3-0  Vicryl followed by 4-0 Monocryl subcuticular and Dermabond.   The patient tolerated the procedure well.  There were no operative  complications.  All counts were correct.      Currie Paris, M.D.  Electronically Signed     CJS/MEDQ  D:  04/24/2006  T:  04/24/2006  Job:  [id number]

## 2011-04-30 ENCOUNTER — Ambulatory Visit (INDEPENDENT_AMBULATORY_CARE_PROVIDER_SITE_OTHER): Payer: Medicare Other | Admitting: Women's Health

## 2011-04-30 DIAGNOSIS — B373 Candidiasis of vulva and vagina: Secondary | ICD-10-CM

## 2011-04-30 DIAGNOSIS — N952 Postmenopausal atrophic vaginitis: Secondary | ICD-10-CM

## 2011-04-30 DIAGNOSIS — N898 Other specified noninflammatory disorders of vagina: Secondary | ICD-10-CM

## 2011-04-30 DIAGNOSIS — R823 Hemoglobinuria: Secondary | ICD-10-CM

## 2011-05-30 ENCOUNTER — Encounter: Payer: Self-pay | Admitting: *Deleted

## 2011-06-13 ENCOUNTER — Encounter (HOSPITAL_BASED_OUTPATIENT_CLINIC_OR_DEPARTMENT_OTHER): Payer: Medicare Other | Admitting: Oncology

## 2011-06-13 ENCOUNTER — Other Ambulatory Visit: Payer: Self-pay | Admitting: Oncology

## 2011-06-13 DIAGNOSIS — C50419 Malignant neoplasm of upper-outer quadrant of unspecified female breast: Secondary | ICD-10-CM

## 2011-06-13 LAB — CBC WITH DIFFERENTIAL/PLATELET
BASO%: 0.4 % (ref 0.0–2.0)
Basophils Absolute: 0 10*3/uL (ref 0.0–0.1)
EOS%: 1.6 % (ref 0.0–7.0)
Eosinophils Absolute: 0.1 10*3/uL (ref 0.0–0.5)
HCT: 36.9 % (ref 34.8–46.6)
HGB: 12.2 g/dL (ref 11.6–15.9)
LYMPH%: 36.4 % (ref 14.0–49.7)
MCH: 28.8 pg (ref 25.1–34.0)
MCHC: 33.1 g/dL (ref 31.5–36.0)
MCV: 87 fL (ref 79.5–101.0)
MONO#: 0.5 10*3/uL (ref 0.1–0.9)
MONO%: 8.2 % (ref 0.0–14.0)
NEUT#: 3 10*3/uL (ref 1.5–6.5)
NEUT%: 53.4 % (ref 38.4–76.8)
Platelets: 197 10*3/uL (ref 145–400)
RBC: 4.24 10*6/uL (ref 3.70–5.45)
RDW: 14.7 % — ABNORMAL HIGH (ref 11.2–14.5)
WBC: 5.5 10*3/uL (ref 3.9–10.3)
lymph#: 2 10*3/uL (ref 0.9–3.3)

## 2011-06-14 LAB — COMPREHENSIVE METABOLIC PANEL
ALT: 17 U/L (ref 0–35)
AST: 17 U/L (ref 0–37)
Alkaline Phosphatase: 45 U/L (ref 39–117)
CO2: 26 mEq/L (ref 19–32)
Sodium: 140 mEq/L (ref 135–145)
Total Bilirubin: 0.5 mg/dL (ref 0.3–1.2)
Total Protein: 6.8 g/dL (ref 6.0–8.3)

## 2011-06-14 LAB — VITAMIN D 25 HYDROXY (VIT D DEFICIENCY, FRACTURES): Vit D, 25-Hydroxy: 57 ng/mL (ref 30–89)

## 2011-06-14 LAB — LACTATE DEHYDROGENASE: LDH: 172 U/L (ref 94–250)

## 2011-06-18 ENCOUNTER — Ambulatory Visit (INDEPENDENT_AMBULATORY_CARE_PROVIDER_SITE_OTHER): Payer: Medicare Other | Admitting: Obstetrics and Gynecology

## 2011-06-18 ENCOUNTER — Encounter: Payer: Self-pay | Admitting: Obstetrics and Gynecology

## 2011-06-18 VITALS — BP 138/89 | Ht 64.5 in | Wt 141.0 lb

## 2011-06-18 DIAGNOSIS — N951 Menopausal and female climacteric states: Secondary | ICD-10-CM

## 2011-06-18 DIAGNOSIS — N949 Unspecified condition associated with female genital organs and menstrual cycle: Secondary | ICD-10-CM

## 2011-06-18 DIAGNOSIS — R350 Frequency of micturition: Secondary | ICD-10-CM

## 2011-06-18 DIAGNOSIS — R82998 Other abnormal findings in urine: Secondary | ICD-10-CM

## 2011-06-18 DIAGNOSIS — C50919 Malignant neoplasm of unspecified site of unspecified female breast: Secondary | ICD-10-CM

## 2011-06-18 DIAGNOSIS — N3090 Cystitis, unspecified without hematuria: Secondary | ICD-10-CM

## 2011-06-18 DIAGNOSIS — Z8481 Family history of carrier of genetic disease: Secondary | ICD-10-CM

## 2011-06-18 DIAGNOSIS — IMO0001 Reserved for inherently not codable concepts without codable children: Secondary | ICD-10-CM

## 2011-06-18 NOTE — Progress Notes (Signed)
The patient came back to see me today for further care. The first thing we discussed was her breast cancer. She is up-to-date on her mammograms and breast specific gamma imaging. She appears to be cancer free. She remains on generic femara. She says that when she takes the generic femara she has less trouble with vaginal dryness then with the brand.Dr. Caron Presume does not want her to take vaginal estrogen. She continues to have pain with intercourse as a result dryness. She also has diminished libido because of her hormonal state. She has tried Replens for the dryness without success. She has 2 sisters now with breast cancer. One of them is positive for BRCA one or 2. The other who lives in [place] has stage IV breast cancer and is having her ovaries removed. The patient has not been checked for genes yet.she is having no vaginal bleeding and no pelvic pain.  Past medical history, family history, social history, review of systems have all been reviewed and are listed in epic record.  Physical examination: HEENT within normal limits. Neck within normal limits. Right breast reveals no skin changes or dominant lesions and was examined both sitting and lying. Left breast is surgically absent. There are no lumps in the area on the left were her breast was surgically removed. Abdomen is soft without guarding rebound or masses. Pelvic: External within normal limits BUS is within normal limits.vaginal exam shows atrophic changes without masses. Bladder is well supported without cystocele. Cervix is clean. Uterus is normal size and shape. Adnexa failed to reveal masses. Rectovaginal is confirmatory. Extremities within normal limits.  Assessment: 1 invasive ductal carcinoma left breast. 2. Atrophic vaginitis. 3. Menopausal symptoms.4. Sibling with BRCA one or 2 positive.  Plan: Patient to try hyalo GYN gel.patient to go to the cancer Center and be checked for BRCA1 and 2. Results to be sent to me. Patient to get me  her sister's results.

## 2011-06-18 NOTE — Progress Notes (Signed)
Addended byCammie Mcgee T on: 06/18/2011 01:02 PM   Modules accepted: Orders

## 2011-06-24 ENCOUNTER — Encounter (HOSPITAL_BASED_OUTPATIENT_CLINIC_OR_DEPARTMENT_OTHER): Payer: Medicare Other | Admitting: Oncology

## 2011-06-24 DIAGNOSIS — C50419 Malignant neoplasm of upper-outer quadrant of unspecified female breast: Secondary | ICD-10-CM

## 2011-06-27 ENCOUNTER — Encounter: Payer: Medicare Other | Admitting: Genetic Counselor

## 2011-07-01 DIAGNOSIS — Z1501 Genetic susceptibility to malignant neoplasm of breast: Secondary | ICD-10-CM

## 2011-07-01 HISTORY — DX: Genetic susceptibility to malignant neoplasm of breast: Z15.01

## 2011-07-04 ENCOUNTER — Telehealth: Payer: Self-pay

## 2011-07-04 NOTE — Telephone Encounter (Signed)
AT RECENT 06-18-11 O.V. YOU REQUESTED BRACA RESULTS(SHE STATES HER BRACA 2 IS POSITIVE. SISTERS BRACA RESULTS WERE SIGNED OFF IN PTS. SEE BOTH REPORTS ON THE FRONT OF PTS PHYSICAL CHART.

## 2011-07-07 ENCOUNTER — Other Ambulatory Visit: Payer: Self-pay | Admitting: Obstetrics and Gynecology

## 2011-07-07 ENCOUNTER — Telehealth: Payer: Self-pay

## 2011-07-07 DIAGNOSIS — Z01818 Encounter for other preprocedural examination: Secondary | ICD-10-CM

## 2011-07-07 NOTE — Telephone Encounter (Signed)
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 I think she should schedule a laparoscopic BSO. Please call the patient and explained. If she doesn't understand get me to the phone. We can do it at [facility] surgical center as outpatient. I am sending you her chart.

## 2011-07-07 NOTE — Telephone Encounter (Signed)
I called patient and informed her that Dr. Eda Paschal has reviewed her [facility] testing and since the [facility] 2 test was positive he has recommended she have Lap BSO.  Patient was very aware that this was what would be needed and was eager to schedule. I scheduled her for Thursday, Sept 6 at 7:30am at [facility] and she will come and see Dr. Reece Agar for consult/preop labwork on Weds., Aug 29 at 11:00am.  I have mailed pamphlet from [facility] to her with all her dates and times on it. She has my direct phone number and will call me if she needs to.  KA

## 2011-07-23 ENCOUNTER — Ambulatory Visit (INDEPENDENT_AMBULATORY_CARE_PROVIDER_SITE_OTHER): Payer: Medicare Other | Admitting: Obstetrics and Gynecology

## 2011-07-23 DIAGNOSIS — R82998 Other abnormal findings in urine: Secondary | ICD-10-CM

## 2011-07-23 DIAGNOSIS — Z1501 Genetic susceptibility to malignant neoplasm of breast: Secondary | ICD-10-CM

## 2011-07-23 DIAGNOSIS — Z01818 Encounter for other preprocedural examination: Secondary | ICD-10-CM

## 2011-07-23 NOTE — Progress Notes (Signed)
Addended byCammie Mcgee T on: 07/23/2011 12:08 PM   Modules accepted: Orders

## 2011-07-23 NOTE — Progress Notes (Signed)
The patient was sent for genetic testing because of a high-risk of having any abnormal mutation. 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 The cancer center quoted her a 27% risk of ovarian cancer based on this test result. We have decided to remove her ovaries and tubes. She understands the rationale. We along discussion about normal recuperation convalescence. She will stop NSAID's before surgery. We discussed risks of surgery including infection, hemorrhage, DVT, injury to bowel or urinary system. She knows her some risk of requiring a laparotomy. All her questions were answered. Preoperative lab was drawn. Total time of all the above was over 30 minutes.

## 2011-07-31 ENCOUNTER — Ambulatory Visit (HOSPITAL_BASED_OUTPATIENT_CLINIC_OR_DEPARTMENT_OTHER)
Admission: RE | Admit: 2011-07-31 | Discharge: 2011-08-01 | Disposition: A | Discharge status: Home / Self Care | Payer: Medicare Other | Source: Ambulatory Visit | Attending: Obstetrics and Gynecology | Admitting: Obstetrics and Gynecology

## 2011-07-31 ENCOUNTER — Other Ambulatory Visit: Payer: Self-pay | Admitting: Obstetrics and Gynecology

## 2011-07-31 DIAGNOSIS — G8918 Other acute postprocedural pain: Secondary | ICD-10-CM | POA: Diagnosis not present

## 2011-07-31 DIAGNOSIS — Z79899 Other long term (current) drug therapy: Secondary | ICD-10-CM

## 2011-07-31 DIAGNOSIS — H409 Unspecified glaucoma: Secondary | ICD-10-CM | POA: Diagnosis present

## 2011-07-31 DIAGNOSIS — Z4002 Encounter for prophylactic removal of ovary: Principal | ICD-10-CM

## 2011-07-31 DIAGNOSIS — K660 Peritoneal adhesions (postprocedural) (postinfection): Secondary | ICD-10-CM | POA: Diagnosis present

## 2011-07-31 DIAGNOSIS — Z800 Family history of malignant neoplasm of digestive organs: Secondary | ICD-10-CM

## 2011-07-31 DIAGNOSIS — N3010 Interstitial cystitis (chronic) without hematuria: Secondary | ICD-10-CM | POA: Diagnosis present

## 2011-07-31 DIAGNOSIS — Z1501 Genetic susceptibility to malignant neoplasm of breast: Secondary | ICD-10-CM

## 2011-07-31 DIAGNOSIS — Z1581 Genetic susceptibility to multiple endocrine neoplasia [MEN]: Secondary | ICD-10-CM

## 2011-07-31 DIAGNOSIS — N736 Female pelvic peritoneal adhesions (postinfective): Secondary | ICD-10-CM

## 2011-07-31 DIAGNOSIS — C50919 Malignant neoplasm of unspecified site of unspecified female breast: Secondary | ICD-10-CM | POA: Diagnosis present

## 2011-07-31 DIAGNOSIS — I10 Essential (primary) hypertension: Secondary | ICD-10-CM | POA: Diagnosis present

## 2011-07-31 DIAGNOSIS — Z803 Family history of malignant neoplasm of breast: Secondary | ICD-10-CM

## 2011-07-31 DIAGNOSIS — Z5331 Laparoscopic surgical procedure converted to open procedure: Secondary | ICD-10-CM

## 2011-07-31 HISTORY — PX: OTHER SURGICAL HISTORY: SHX169

## 2011-07-31 HISTORY — PX: BILATERAL SALPINGOOPHORECTOMY: SHX1223

## 2011-07-31 LAB — POCT I-STAT 4, (NA,K, GLUC, HGB,HCT)
HCT: 38 % (ref 36.0–46.0)
Hemoglobin: 12.9 g/dL (ref 12.0–15.0)
Sodium: 142 mEq/L (ref 135–145)

## 2011-08-01 ENCOUNTER — Inpatient Hospital Stay (HOSPITAL_COMMUNITY)
Admission: AD | Admit: 2011-08-01 | Discharge: 2011-08-02 | DRG: 743 | Disposition: A | Discharge status: Home / Self Care | Payer: Medicare Other | Source: Ambulatory Visit | Attending: Obstetrics and Gynecology | Admitting: Obstetrics and Gynecology

## 2011-08-01 NOTE — H&P (Signed)
[name], [name]            ACCOUNT NO.:  [id number]  MEDICAL RECORD NO.:  [id number]  LOCATION:                                 FACILITY:  PHYSICIAN:  Shelbe Haglund L. Liberta Gimpel, M.D.DATE OF BIRTH:  1946/06/08  DATE OF ADMISSION: DATE OF DISCHARGE:                             HISTORY & PHYSICAL   She is for the operating room at [facility] on Thursday, September 6, 07:30.  CHIEF COMPLAINT:  BRCA-2 positive.  HISTORY OF PRESENT ILLNESS:  The patient is a 65 year old gravida 2, para 0-0-2-0 with 3 adopted children who has been diagnosed with breast cancer.  She has been treated with left mastectomy, postoperative chemotherapy, and is currently on Femara.  She had an intraductal invasive malignancy.  Of significance is that she has 3 sisters all of whom have had breast cancer as well.  As a result of this significant history, BRCA-1 and BRCA-2 testing was done and she is positive for the deleterious mutation of BRCA-2.  As a result of this, she has an extremely high risk of ovarian cancer and it has been recommended that she have her ovaries and fallopian tubes removed.  She now enters the hospital for this.  PAST MEDICAL HISTORY: 1. Breast carcinoma, see above. 2. Glaucoma. 3. Interstitial cystitis. 4. Hypertension. 5. Previous abnormal bleeding.  PRESENT MEDICATIONS:  Premarin vaginal cream, Hyzaar, Lipitor, vitamin D, Femara, Alphagan, Xalatan, Cosopt.  ALLERGIES:  No known drug allergies.  SURGERIES:  Hysteroscopic D&C in 2001, 2 ectopic pregnancies in 1975 and 1976 with portions of both tubes removed, eye surgery for her glaucoma, left mastectomy in 2006, Port-A-Cath in 2006, hand surgery in 2007.  FAMILY HISTORY:  Three sisters with breast cancer.  Father with colon cancer.  Mother with hypertension.  Sister is diabetic.  SOCIAL HISTORY:  She is a nonsmoker, nondrinker.  REVIEW OF SYSTEMS:  HEENT:  Reveals a history of glaucoma.   CARDIAC: Reveals a history of hypertension.  GI:  Negative.  GU:  Reveals interstitial cystitis.  NEUROMUSCULAR:  Negative.  ENDOCRINE:  Negative.  PHYSICAL EXAMINATION:  GENERAL:  The patient is a well-developed and well-nourished female, in no acute distress. VITAL SIGNS:  Blood pressure is 122/76, pulse is 80 and regular, respirations 15 and unlabored.  She is afebrile. HEENT:  Within normal limits. NECK:  Supple.  Trachea in the midline.  Thyroid is not enlarged. LUNGS:  Clear to P and A. HEART:  No thrills, heaves, or murmurs. BREASTS:  Right breast is entirely normal.  Left breast is status post mastectomy with no masses felt. ABDOMEN:  Within normal limits. SKIN:  Within normal limits. NEUROLOGICAL:  Within normal limits. PSYCHIATRIC:  Within normal limits. PELVIC:  External genitalia is within normal limits.  BUS within normal limits.  Bladder within normal limits.  Vagina within normal limits. Cervix is clean.  Pap smear shows no atypia.  Uterus is normal size and shape.  Adnexa fails to reveal masses.  Rectovaginal is negative. EXTREMITIES:  Within normal limits.  ADMISSION IMPRESSION:  Breast cancer gene 2 positive.  PLAN:  Diagnostic laparoscopy with BSO.     Yishai Rehfeld L. Eda Paschal, M.D.     DLG/MEDQ  D:  07/30/2011  T:  07/30/2011  Job:  [id number]  cc:   [facility]  Electronically Signed by Edyth Gunnels M.D. on 08/01/2011 07:32:46 AM

## 2011-08-04 ENCOUNTER — Encounter: Payer: Self-pay | Admitting: Gynecology

## 2011-08-04 ENCOUNTER — Ambulatory Visit (INDEPENDENT_AMBULATORY_CARE_PROVIDER_SITE_OTHER): Payer: Medicare Other | Admitting: Obstetrics and Gynecology

## 2011-08-04 DIAGNOSIS — N736 Female pelvic peritoneal adhesions (postinfective): Secondary | ICD-10-CM

## 2011-08-04 NOTE — Op Note (Signed)
NAMETEAGEN, BUCIO            ACCOUNT NO.:  [id number]  MEDICAL RECORD NO.:  [id number]  LOCATION:                                 FACILITY:  PHYSICIAN:  Tamlyn Sides L. Ronald Londo, M.D.DATE OF BIRTH:  07-10-1946  DATE OF PROCEDURE:  07/31/2011 DATE OF DISCHARGE:                              OPERATIVE REPORT   PREOPERATIVE DIAGNOSIS:  Deleterious mutation on BRCA2.  POSTOPERATIVE DIAGNOSIS:  Deleterious mutation on BRCA2.  NAME OF OPERATION:  Diagnostic laparoscopy followed by exploratory laparotomy with lysis of pelvic adhesions, left salpingo-oophorectomy, possible right salpingo-oophorectomy.  SURGEON:  Adalbert Alberto L. Eda Paschal, M.D.  FIRST ASSISTANT:  Timothy P. Fontaine, M.D.  FINDINGS:  At the time of laparoscopy, there were dense adhesions involving small and large bowel into the pelvis.  These really obscured our view of the pelvic structures in such a fashion that it was not felt safe to proceed with laparoscopic surgery.  In addition, it was also not possible to place additional trocars because of concern that there was no peritoneal space free of bowel anteriorly and that we would injure bowel in placing other trocars in order to dissect off the above. Therefore, a laparotomy was performed.  At the time of laparotomy, there were still very dense adhesions seen.  It became clear that a good decision had been made not to proceed laparoscopically as it would not have been successful.  Once all adhesions were freed, patient's left ovary and a portion of her left fallopian tube could be seen.  She was status post partial salpingectomy on the left many years ago for an ectopic pregnancy.  The ovary and tube appeared to be normal.  On the right side, we never could identify either a right fallopian tube nor an obvious right ovary.  By history, she had had a right salpingectomy for an ectopic pregnancy as well, but in reviewing her operative notes from both ectopic procedures,  there was no mention that either ovary had been removed and the pathology report did not describe ovarian tissue either. There was one area on the right that was encased in adhesions and fat, which both Dr. Audie Box and I felt could be a small segment of ovary, but it was never clearly identified as such.  Once all adhesions have been freed, the patient had a completely free cul-de-sac, a completely free right adnexal area, and there was no obvious place where an ovary could have been hidden that we would have not seen it other than possibly an extremely small streak ovary.  Certainly, there could have been some necrosis of ovarian blood supply with shrinkage of the ovary as a result of the above findings.  PROCEDURE:  After adequate general anesthesia, the patient was placed in the dorsal lithotomy position, prepped and draped in usual sterile manner.  A Foley catheter was inserted in the patient's bladder.  A Hulka catheter was placed in the uterus.  A subumbilical incision was made.  A 10 mm laparoscope was introduced under direct vision through the Opti-Vu and the peritoneal cavity was entered atraumatically.  At this point, we encountered massive adhesive disease as described above. It was not felt that there be a good way  to dissect this free to allow [place] to do the intended surgery.  In addition, we did not feel safe putting any additional trocars because of the adhesive disease in the  lower quadrants making the anterior peritoneal surface not free from disease.  As a result of this, we removed the trocar and the laparoscope and proceeded with an exploratory laparotomy.  A small Pfannenstiel incision was made.  The fascia was opened transversely. The peritoneum was entered vertically.  Subcutaneous bleeders were bovied and encountered.  Part of the rectus muscles bilaterally were separated in order to give [place] better exposure.  They were not completely separated.  At this point, the  pelvis was entered and then using sharp dissection all pelvic adhesive disease was freed up.  At this point, we could see the left adnexa well.  The retroperitoneal space was entered.  The ureter was identified.  The IP ligament was clamped and cut and sutured with 0 Vicryl.  The additional attachments of the ovary and tube to the broad ligament and the uterus were likewise handled and sutured with 0 Vicryl.  The patient only had a remnant of her left fallopian tube because of her previous ectopic pregnancy, but the entire ovary could be removed.  Attention was next turned to the right side.  Careful dissection had been done and now we had a good view of both the cul-de-sac in the right side.  There really was no structure that clearly looked like a either fallopian tube or ovary.  Once again as noted above, review of the operative note and pathology report had not shown that the right ovary had been removed, however, we could really not find a normal right ovary.  The only  structures that we did find were removed.  Once the ureter had been isolated, so that we would not injure it and these were sent to pathology for tissue diagnosis as was the left ovary and tube.  It should be noted that prior to any dissection, peritoneal washings were also obtained. Please also note that the retroperitoneal area on the right was completely opened and explored. Two sponge, needle, instrument counts were correct.  There was no bleeding noted.  The peritoneal cavity was unpacked.  The ileocecal junction was identified and the patient had a normal appendix.  The fascia was closed with 2 running 0 Vicryl and the skin incision was closed with staples.  The patient tolerated the procedure well and left the operating room in satisfactory condition, draining clear urine from her Foley catheter.     Chalmer Zheng L. Eda Paschal, M.D.     Tonette Bihari  D:  07/31/2011  T:  08/01/2011  Job:  [id number]  Electronically  Signed by Edyth Gunnels M.D. on 08/04/2011 08:39:05 AM

## 2011-08-04 NOTE — Discharge Summary (Signed)
  [name], HAALAND NO.:  [id number]  MEDICAL RECORD NO.:  [id number]  LOCATION:  1303                         FACILITY:  [facility]  PHYSICIAN:  Rande Brunt. Charon Akamine, M.D.DATE OF BIRTH:  1946/07/14  DATE OF ADMISSION:  07/31/2011 DATE OF DISCHARGE:  08/02/2011                              DISCHARGE SUMMARY   The patient is a 65 year old female who was admitted to the surgical center at [facility] for laparoscopic BSO because of a positive BRCA-2 deleterious gene.  On the day, she came a laparoscopy was performed showing dense pelvic adhesive disease.  As a result of this, the procedure was converted to a laparotomy.  The patient had dense adhesions of small and large bowel which were lysed, freeing up the pelvis.  A left salpingo-oophorectomy was done.  We never saw a right ovary.  She had had 2 previous surgeries for ectopic pregnancy and so it is conceivable that the right ovary was removed, although it is not noted on the old operative notes.  Postoperatively, she had enough pain from her Pfannenstiel incision that she could not be discharged in 23 hours as we normally would do with a small laparotomy, so she was transferred to [facility] for an additional night stay.  By the second postoperative day, she was voiding well, tolerating a p.o. diet and was being managed on oral pain medicine.  She was discharged on Tylox for pain relief, soft diet to advance as needed and will return to our office in 2 days for staple removal.  Preoperative lab work is in chart and is all normal.  There was no postoperative lab work done.  Final pathology report is not available at the time of this dictation. Condition on discharge is improved.  DISCHARGE DIAGNOSIS:  Breast cancer with BRCA-2 deleterious gene.  OPERATIONS:  Diagnostic laparoscopy followed by exploratory laparotomy with left salpingo-oophorectomy.  An attempt at removing any tissue from the right that would be  a remnant of the right ovary.  ADDENDUM:  Please note that I have already had 2 long discussions with the patient and her husband that it is possible that she has a very small streak of right ovary that we were not able to see and therefore not removed and she will consider that if she ever has any pelvic symptoms.     Yandiel Bergum L. Eda Paschal, M.D.     Tonette Bihari  D:  08/02/2011  T:  08/02/2011  Job:  [id number]  Electronically Signed by Edyth Gunnels M.D. on 08/04/2011 08:40:15 AM

## 2011-08-04 NOTE — Progress Notes (Signed)
Patient came back today and had her staples removed. Her incision is clean closed. Her pathology report came back benign. They did not find her right ovary. We once again discussed the fact that I could not remove her right ovary. I have asked her to return in 4 weeks for further followup.

## 2011-08-05 ENCOUNTER — Other Ambulatory Visit: Payer: Self-pay | Admitting: *Deleted

## 2011-08-05 DIAGNOSIS — C50919 Malignant neoplasm of unspecified site of unspecified female breast: Secondary | ICD-10-CM

## 2011-08-06 ENCOUNTER — Telehealth: Payer: Self-pay

## 2011-08-06 MED ORDER — PROMETHAZINE HCL 50 MG RE SUPP: 50.0000 mg | RECTAL | Status: AC | PRN

## 2011-08-06 NOTE — Telephone Encounter (Signed)
TOOK OXCODONE 5-500 AT 7 P.M. 08-05-11. STARTED VOMITING AT 3 AM  FOR AT LEAST 10 EPISODES OF VOMITING TODAY AND NOW HAS DIARRHEA TOO. CAN NOT KEEP ANYTHING DOWN. JUST TOOK HER ORAL TEMP. AND IT WAS 98.33F.

## 2011-08-06 NOTE — Telephone Encounter (Signed)
Give patient Phenergan rectal suppositories 50 mg. Give her 6 of them. Have her stop the OxyCodone. Let us know if vomiting persists. Have her just use it advil for pain relief.

## 2011-08-06 NOTE — Telephone Encounter (Signed)
PT. NOTIFIED OF DR. G'S NOTE BELOW & SENT RX TO THE PHARMACY.

## 2011-08-07 ENCOUNTER — Telehealth: Payer: Self-pay

## 2011-08-07 NOTE — Telephone Encounter (Signed)
PT. STATES SHE IS IMPROVED 100% FROM 08/06/11. STATES SHE THINKS THE COATING ON THE PAIN RX HELPED MAKE VOMITING EVEN WORSE WHEN TRYING TO SWALLOW THE PILLS

## 2011-08-08 ENCOUNTER — Telehealth: Payer: Self-pay | Admitting: *Deleted

## 2011-08-08 MED ORDER — TRAMADOL HCL 50 MG PO TABS: 50.0000 mg | ORAL_TABLET | Freq: Four times a day (QID) | ORAL | Status: DC | PRN

## 2011-08-08 NOTE — Telephone Encounter (Signed)
Pt had a lap bso 9/7. Everything going ok. Couldn't take pain rx with codeine made her sick with vomiting and diarrhea and advil just doesn't cut it and take away her discomfort. She is requesting a pain rx without codeine. Next post op 09/02/11. She has no allergies. Please advise. thx

## 2011-08-08 NOTE — Telephone Encounter (Signed)
Per Jf he ok'ed Ultram. Pt informed

## 2011-08-11 ENCOUNTER — Telehealth: Payer: Self-pay | Admitting: Gynecology

## 2011-08-11 NOTE — Telephone Encounter (Signed)
Pt calls c/o nausea and pain.  Had issues with pain med before and was called in Tramadol which is not helping with pain.  Also has nausea after eating.  Has tried phenergan supp 50 mg without relief.  Drinking fluids ok, voiding frequently, without fevers.  Incision ok.  Had extensive adhesiolysis with surgery 10 days ago.  Signs and symptoms of bowel obstruction / perforation reviewed and patient denies significant symptoms.  Will rx with lortab 5.0 tabs # 20 1-2 Q 6 hours prn and zofran ODT 8 mg # 10.  ASAP call precautions reviewed.  Called into [facility] at her request.

## 2011-08-13 ENCOUNTER — Telehealth: Payer: Self-pay

## 2011-08-13 DIAGNOSIS — R197 Diarrhea, unspecified: Secondary | ICD-10-CM

## 2011-08-13 NOTE — Telephone Encounter (Signed)
SEE YOUR ON CALL NOTE OF 08-11-11. PAIN HAS SUBSIDED AS OF TODAY. THE LAST DOSE OF LORATAB WAS LAST NIGHT. PT STILL ON LIQUIDS ONLY PER YOUR SUGGESTION. NOW C-O PERSISTENT DIARRHEA AND STOMACH GURGLING. HAS BEEN TAKING IMMODIUM SINCE THE WEEKEND. NO OTHER SYMPTOMS NOTED. POST OP OV WITH DR. Reece Agar NOT UNTIL 09-02-11.

## 2011-08-13 NOTE — Telephone Encounter (Signed)
I would recommend stool sample for C. Difficile. Otherwise I would recommend staying on a light diet and I would move upper post op appointment to see Dr. Eda Paschal soon as he returns.

## 2011-08-14 ENCOUNTER — Inpatient Hospital Stay (HOSPITAL_COMMUNITY)
Admission: EM | Admit: 2011-08-14 | Discharge: 2011-08-17 | DRG: 392 | Disposition: A | Discharge status: Home / Self Care | Payer: Medicare Other | Attending: Internal Medicine | Admitting: Internal Medicine

## 2011-08-14 ENCOUNTER — Emergency Department (HOSPITAL_COMMUNITY): Payer: Medicare Other

## 2011-08-14 DIAGNOSIS — E876 Hypokalemia: Secondary | ICD-10-CM | POA: Diagnosis present

## 2011-08-14 DIAGNOSIS — H409 Unspecified glaucoma: Secondary | ICD-10-CM | POA: Diagnosis present

## 2011-08-14 DIAGNOSIS — E785 Hyperlipidemia, unspecified: Secondary | ICD-10-CM | POA: Diagnosis present

## 2011-08-14 DIAGNOSIS — Z853 Personal history of malignant neoplasm of breast: Secondary | ICD-10-CM

## 2011-08-14 DIAGNOSIS — G4700 Insomnia, unspecified: Secondary | ICD-10-CM | POA: Diagnosis present

## 2011-08-14 DIAGNOSIS — E860 Dehydration: Secondary | ICD-10-CM | POA: Diagnosis present

## 2011-08-14 DIAGNOSIS — K5289 Other specified noninfective gastroenteritis and colitis: Principal | ICD-10-CM | POA: Diagnosis present

## 2011-08-14 DIAGNOSIS — I10 Essential (primary) hypertension: Secondary | ICD-10-CM | POA: Diagnosis present

## 2011-08-14 HISTORY — DX: Personal history of malignant neoplasm of breast: Z85.3

## 2011-08-14 LAB — CBC
HCT: 38.4 % (ref 36.0–46.0)
Hemoglobin: 13.2 g/dL (ref 12.0–15.0)
WBC: 9.5 10*3/uL (ref 4.0–10.5)

## 2011-08-14 LAB — DIFFERENTIAL
Basophils Absolute: 0 10*3/uL (ref 0.0–0.1)
Lymphocytes Relative: 29 % (ref 12–46)
Monocytes Absolute: 1.3 10*3/uL — ABNORMAL HIGH (ref 0.1–1.0)
Neutro Abs: 5.2 10*3/uL (ref 1.7–7.7)

## 2011-08-14 LAB — COMPREHENSIVE METABOLIC PANEL
Alkaline Phosphatase: 86 U/L (ref 39–117)
BUN: 18 mg/dL (ref 6–23)
Chloride: 101 mEq/L (ref 96–112)
GFR calc Af Amer: >60 mL/min (ref >60)
Glucose, Bld: 90 mg/dL (ref 70–99)
Potassium: 3.1 mEq/L — ABNORMAL LOW (ref 3.5–5.1)
Total Bilirubin: 0.4 mg/dL (ref 0.3–1.2)

## 2011-08-14 NOTE — Telephone Encounter (Signed)
PT. NOTIFIED OF DR. FONTAINES NOTE BELOW AND SHE WILL SEND DAUGHTER TO PICK UP KIT. RECEIVED STOOL KIT FROM OUR LAB AND PUT ORDER IN P.C.

## 2011-08-15 ENCOUNTER — Emergency Department (HOSPITAL_COMMUNITY): Payer: Medicare Other

## 2011-08-15 ENCOUNTER — Encounter (HOSPITAL_COMMUNITY): Payer: Self-pay

## 2011-08-15 ENCOUNTER — Encounter (INDEPENDENT_AMBULATORY_CARE_PROVIDER_SITE_OTHER): Payer: Self-pay | Admitting: Surgery

## 2011-08-15 DIAGNOSIS — R197 Diarrhea, unspecified: Secondary | ICD-10-CM

## 2011-08-15 DIAGNOSIS — R109 Unspecified abdominal pain: Secondary | ICD-10-CM

## 2011-08-15 LAB — COMPREHENSIVE METABOLIC PANEL
ALT: 62 U/L — ABNORMAL HIGH (ref 0–35)
AST: 19 U/L (ref 0–37)
Albumin: 4 g/dL (ref 3.5–5.2)
Alkaline Phosphatase: 76 U/L (ref 39–117)
BUN: 15 mg/dL (ref 6–23)
Chloride: 102 mEq/L (ref 96–112)
Potassium: 3.7 mEq/L (ref 3.5–5.1)
Sodium: 135 mEq/L (ref 135–145)
Total Bilirubin: 0.5 mg/dL (ref 0.3–1.2)

## 2011-08-15 LAB — DIFFERENTIAL
Basophils Relative: 1 % (ref 0–1)
Eosinophils Absolute: 0.2 10*3/uL (ref 0.0–0.7)
Lymphocytes Relative: 28 % (ref 12–46)
Neutrophils Relative %: 54 % (ref 43–77)

## 2011-08-15 LAB — URINALYSIS, ROUTINE W REFLEX MICROSCOPIC
Ketones, ur: 40 mg/dL — AB
Nitrite: NEGATIVE
Protein, ur: 30 mg/dL — AB

## 2011-08-15 LAB — CBC
HCT: 36 % (ref 36.0–46.0)
Platelets: 313 10*3/uL (ref 150–400)
RDW: 14.7 % (ref 11.5–15.5)
WBC: 10.4 10*3/uL (ref 4.0–10.5)

## 2011-08-15 LAB — URINE MICROSCOPIC-ADD ON

## 2011-08-15 MED ORDER — IOHEXOL 300 MG/ML  SOLN
100.0000 mL | Freq: Once | INTRAMUSCULAR | Status: AC | PRN
  Administered 2011-08-15: 100 mL via INTRAVENOUS

## 2011-08-16 LAB — DIFFERENTIAL
Basophils Relative: 1 % (ref 0–1)
Eosinophils Relative: 3 % (ref 0–5)
Lymphocytes Relative: 31 % (ref 12–46)
Monocytes Relative: 13 % — ABNORMAL HIGH (ref 3–12)
Neutro Abs: 4.2 10*3/uL (ref 1.7–7.7)
Neutrophils Relative %: 52 % (ref 43–77)

## 2011-08-16 LAB — COMPREHENSIVE METABOLIC PANEL
ALT: 45 U/L — ABNORMAL HIGH (ref 0–35)
Albumin: 3.4 g/dL — ABNORMAL LOW (ref 3.5–5.2)
Alkaline Phosphatase: 64 U/L (ref 39–117)
BUN: 8 mg/dL (ref 6–23)
Chloride: 113 mEq/L — ABNORMAL HIGH (ref 96–112)
GFR calc Af Amer: >60 mL/min (ref >60)
Glucose, Bld: 73 mg/dL (ref 70–99)
Potassium: 3 mEq/L — ABNORMAL LOW (ref 3.5–5.1)
Sodium: 144 mEq/L (ref 135–145)
Total Bilirubin: 0.4 mg/dL (ref 0.3–1.2)
Total Protein: 6.7 g/dL (ref 6.0–8.3)

## 2011-08-16 LAB — FECAL LACTOFERRIN, QUANT

## 2011-08-16 LAB — CBC
HCT: 32.8 % — ABNORMAL LOW (ref 36.0–46.0)
Hemoglobin: 11 g/dL — ABNORMAL LOW (ref 12.0–15.0)
WBC: 8 10*3/uL (ref 4.0–10.5)

## 2011-08-17 LAB — CBC
HCT: 30.7 % — ABNORMAL LOW (ref 36.0–46.0)
Hemoglobin: 10.6 g/dL — ABNORMAL LOW (ref 12.0–15.0)
MCH: 30 pg (ref 26.0–34.0)
MCV: 87 fL (ref 78.0–100.0)
RBC: 3.53 MIL/uL — ABNORMAL LOW (ref 3.87–5.11)
WBC: 7.5 10*3/uL (ref 4.0–10.5)

## 2011-08-17 LAB — COMPREHENSIVE METABOLIC PANEL
AST: 17 U/L (ref 0–37)
BUN: 7 mg/dL (ref 6–23)
CO2: 17 mEq/L — ABNORMAL LOW (ref 19–32)
Calcium: 8.5 mg/dL (ref 8.4–10.5)
Chloride: 112 mEq/L (ref 96–112)
Creatinine, Ser: 0.54 mg/dL (ref 0.50–1.10)
GFR calc Af Amer: >60 mL/min (ref >60)
GFR calc non Af Amer: >60 mL/min (ref >60)
Glucose, Bld: 97 mg/dL (ref 70–99)
Total Bilirubin: 0.2 mg/dL — ABNORMAL LOW (ref 0.3–1.2)

## 2011-08-17 LAB — DIFFERENTIAL
Eosinophils Absolute: 0.2 10*3/uL (ref 0.0–0.7)
Lymphocytes Relative: 29 % (ref 12–46)
Lymphs Abs: 2.2 10*3/uL (ref 0.7–4.0)
Monocytes Relative: 12 % (ref 3–12)
Neutrophils Relative %: 55 % (ref 43–77)

## 2011-08-18 ENCOUNTER — Encounter: Payer: Self-pay | Admitting: Obstetrics and Gynecology

## 2011-08-18 ENCOUNTER — Ambulatory Visit (INDEPENDENT_AMBULATORY_CARE_PROVIDER_SITE_OTHER): Payer: Medicare Other | Admitting: Obstetrics and Gynecology

## 2011-08-18 DIAGNOSIS — A0472 Enterocolitis due to Clostridium difficile, not specified as recurrent: Secondary | ICD-10-CM

## 2011-08-18 NOTE — Progress Notes (Signed)
The patient came back to see me today for a postoperative visit. She had to be readmitted because of what was thought to be C. Difficile toxin. She was discharged yesterday and is feeling much better. She was taken care of by her PCP. She continues to recuperate well from her surgery with me.  Abdomen is soft without guarding rebound or masses. Incision is well-healed. Pelvic exam: External within normal limits, vaginal within normal limits, cervix is clean, uterus is normal size and shape, adnexa fails to reveal masses.  Assessment: Normal postoperative recovery from the GYN point of view.  Plan: Once again discussed the findings of surgery. She knows she still has her uterus. She knows that we never found the right ovary but she may have a microscopic remnant. She knows to tell everyone that that's a possibility if she gets sick.

## 2011-08-19 LAB — STOOL CULTURE

## 2011-08-19 LAB — OVA AND PARASITE EXAMINATION

## 2011-08-26 ENCOUNTER — Ambulatory Visit (INDEPENDENT_AMBULATORY_CARE_PROVIDER_SITE_OTHER): Payer: Medicare Other | Admitting: Obstetrics and Gynecology

## 2011-08-26 DIAGNOSIS — B3731 Acute candidiasis of vulva and vagina: Secondary | ICD-10-CM

## 2011-08-26 DIAGNOSIS — B373 Candidiasis of vulva and vagina: Secondary | ICD-10-CM

## 2011-08-26 DIAGNOSIS — N952 Postmenopausal atrophic vaginitis: Secondary | ICD-10-CM

## 2011-08-26 DIAGNOSIS — N898 Other specified noninflammatory disorders of vagina: Secondary | ICD-10-CM

## 2011-08-26 MED ORDER — TERCONAZOLE 0.8 % VA CREA: 1.0000 | TOPICAL_CREAM | Freq: Every day | VAGINAL | Status: AC

## 2011-08-26 NOTE — Progress Notes (Signed)
Patient came back today with a history of vulvar and vaginal itching and discharge. As a result of her colitis she has been on antibiotics. Otherwise she is doing well and feels completely healed from her surgery.  External: Significant vulvitis. Vaginal exam decreased estrogen effect. Positive wet prep for yeast.  Assessment: Yeast vaginitis  Plan: Terconazole 3 cream. Use externally and internally.

## 2011-09-02 ENCOUNTER — Other Ambulatory Visit: Payer: Self-pay | Admitting: Oncology

## 2011-09-02 ENCOUNTER — Ambulatory Visit: Payer: Medicare Other | Admitting: Obstetrics and Gynecology

## 2011-09-02 DIAGNOSIS — Z9012 Acquired absence of left breast and nipple: Secondary | ICD-10-CM

## 2011-09-02 DIAGNOSIS — Z853 Personal history of malignant neoplasm of breast: Secondary | ICD-10-CM

## 2011-09-11 ENCOUNTER — Ambulatory Visit
Admission: RE | Admit: 2011-09-11 | Discharge: 2011-09-11 | Disposition: A | Discharge status: Home / Self Care | Payer: Medicare Other | Source: Ambulatory Visit | Attending: Oncology | Admitting: Oncology

## 2011-09-11 DIAGNOSIS — Z853 Personal history of malignant neoplasm of breast: Secondary | ICD-10-CM

## 2011-09-11 DIAGNOSIS — Z9012 Acquired absence of left breast and nipple: Secondary | ICD-10-CM

## 2011-09-11 MED ORDER — GADOBENATE DIMEGLUMINE 529 MG/ML IV SOLN
12.0000 mL | Freq: Once | INTRAVENOUS | Status: AC | PRN
  Administered 2011-09-11: 12 mL via INTRAVENOUS

## 2011-09-24 ENCOUNTER — Encounter (INDEPENDENT_AMBULATORY_CARE_PROVIDER_SITE_OTHER): Payer: Self-pay | Admitting: Surgery

## 2011-11-28 ENCOUNTER — Other Ambulatory Visit: Payer: Self-pay | Admitting: Obstetrics and Gynecology

## 2011-11-28 ENCOUNTER — Ambulatory Visit (INDEPENDENT_AMBULATORY_CARE_PROVIDER_SITE_OTHER): Payer: Medicare Other | Admitting: Obstetrics and Gynecology

## 2011-11-28 DIAGNOSIS — N760 Acute vaginitis: Secondary | ICD-10-CM | POA: Diagnosis not present

## 2011-11-28 DIAGNOSIS — M549 Dorsalgia, unspecified: Secondary | ICD-10-CM | POA: Diagnosis not present

## 2011-11-28 DIAGNOSIS — A499 Bacterial infection, unspecified: Secondary | ICD-10-CM

## 2011-11-28 DIAGNOSIS — B9689 Other specified bacterial agents as the cause of diseases classified elsewhere: Secondary | ICD-10-CM

## 2011-11-28 DIAGNOSIS — N899 Noninflammatory disorder of vagina, unspecified: Secondary | ICD-10-CM

## 2011-11-28 DIAGNOSIS — N898 Other specified noninflammatory disorders of vagina: Secondary | ICD-10-CM

## 2011-11-28 LAB — URINALYSIS, ROUTINE W REFLEX MICROSCOPIC
Bilirubin Urine: NEGATIVE
Glucose, UA: NEGATIVE mg/dL
Ketones, ur: NEGATIVE mg/dL
Specific Gravity, Urine: 1.025 (ref 1.005–1.030)
pH: 6 (ref 5.0–8.0)

## 2011-11-28 LAB — WET PREP FOR TRICH, YEAST, CLUE
Trich, Wet Prep: NONE SEEN
Yeast Wet Prep HPF POC: NONE SEEN

## 2011-11-28 LAB — URINALYSIS, MICROSCOPIC ONLY: Crystals: NONE SEEN

## 2011-11-28 MED ORDER — METRONIDAZOLE 0.75 % VA GEL: VAGINAL | Status: DC

## 2011-11-28 NOTE — Progress Notes (Signed)
Patient came to see me today complaining mostly of lower back pain. This is usually a symptom for her when she has had bacterial vaginosis. She's not really aware of vaginal discharge. She is having no vaginal itching. She is having no dysuria, urinary frequency, or urgency. She first went to her urologist and had a treatment for her interstitial cystitis but that did not help.  Pelvic exam: Kennon Portela present. External within normal limits. Vaginal exam shows a frothy discharge. Wet prep shows many clue cells. Patient has a positive whiff  Test. Cervix is clean. Uterus is normal size and shape. Adnexa failed to reveal masses.  Assessment: Bacterial vaginosis  Plan: MetroGel vaginal cream one applicatorful at bedtime in her vagina for 5 days.

## 2011-12-08 DIAGNOSIS — H4011X0 Primary open-angle glaucoma, stage unspecified: Secondary | ICD-10-CM | POA: Diagnosis not present

## 2011-12-08 DIAGNOSIS — H409 Unspecified glaucoma: Secondary | ICD-10-CM | POA: Diagnosis not present

## 2011-12-08 DIAGNOSIS — H21549 Posterior synechiae (iris), unspecified eye: Secondary | ICD-10-CM | POA: Diagnosis not present

## 2011-12-08 DIAGNOSIS — H538 Other visual disturbances: Secondary | ICD-10-CM | POA: Diagnosis not present

## 2011-12-10 ENCOUNTER — Telehealth: Payer: Self-pay | Admitting: *Deleted

## 2011-12-10 MED ORDER — METRONIDAZOLE 500 MG PO TABS: 500.0000 mg | ORAL_TABLET | Freq: Two times a day (BID) | ORAL | Status: AC

## 2011-12-10 NOTE — Telephone Encounter (Signed)
Flagyl 500 mg twice a day for 7 days.

## 2011-12-10 NOTE — Telephone Encounter (Signed)
Pt was seen on 11/28/11 for lower back pain was giving Rx for Metrogel vaginal cream 0.075 for BV. Pt has taken all Rx, she now has same lower back pain along with brownish discharge. Pt would like Rx. Please advise

## 2011-12-10 NOTE — Telephone Encounter (Signed)
Pt informed with the below note, rx sent to pharmacy. 

## 2011-12-16 ENCOUNTER — Other Ambulatory Visit: Payer: Self-pay | Admitting: Oncology

## 2011-12-16 DIAGNOSIS — C50419 Malignant neoplasm of upper-outer quadrant of unspecified female breast: Secondary | ICD-10-CM

## 2011-12-27 ENCOUNTER — Telehealth: Payer: Self-pay | Admitting: Oncology

## 2011-12-27 NOTE — Telephone Encounter (Signed)
per mosaiq appts made and mailed for 01/2012  aom

## 2012-02-12 ENCOUNTER — Telehealth: Payer: Self-pay | Admitting: *Deleted

## 2012-02-12 ENCOUNTER — Ambulatory Visit (HOSPITAL_BASED_OUTPATIENT_CLINIC_OR_DEPARTMENT_OTHER): Payer: Medicare Other | Admitting: Oncology

## 2012-02-12 ENCOUNTER — Other Ambulatory Visit (HOSPITAL_BASED_OUTPATIENT_CLINIC_OR_DEPARTMENT_OTHER): Payer: Medicare Other | Admitting: Lab

## 2012-02-12 VITALS — BP 143/84 | HR 85 | Temp 97.9°F | Ht 64.5 in | Wt 144.8 lb

## 2012-02-12 DIAGNOSIS — C50419 Malignant neoplasm of upper-outer quadrant of unspecified female breast: Secondary | ICD-10-CM | POA: Diagnosis not present

## 2012-02-12 DIAGNOSIS — E559 Vitamin D deficiency, unspecified: Secondary | ICD-10-CM

## 2012-02-12 DIAGNOSIS — Z853 Personal history of malignant neoplasm of breast: Secondary | ICD-10-CM

## 2012-02-12 DIAGNOSIS — Z79811 Long term (current) use of aromatase inhibitors: Secondary | ICD-10-CM | POA: Diagnosis not present

## 2012-02-12 LAB — CBC WITH DIFFERENTIAL/PLATELET
Basophils Absolute: 0 10*3/uL (ref 0.0–0.1)
Eosinophils Absolute: 0.1 10*3/uL (ref 0.0–0.5)
HCT: 36.4 % (ref 34.8–46.6)
HGB: 12.2 g/dL (ref 11.6–15.9)
MCV: 88.1 fL (ref 79.5–101.0)
MONO%: 9.7 % (ref 0.0–14.0)
NEUT#: 2.9 10*3/uL (ref 1.5–6.5)
NEUT%: 45.6 % (ref 38.4–76.8)
Platelets: 225 10*3/uL (ref 145–400)
RDW: 14.8 % — ABNORMAL HIGH (ref 11.2–14.5)

## 2012-02-12 NOTE — Progress Notes (Signed)
Hematology and Oncology Follow Up Visit  [name] [id number] 03-11-1946 66 y.o. 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  Interim History:  There have been no intercurrent illness, hospitalizations or medication changes.She is due for a bone density test and colonoscopy. S/p oophorectomy 10/12  Medications: I have reviewed the patient's current medications.  Allergies: No Known Allergies  Past Medical History, Surgical history, Social history, and Family History were reviewed and updated.  Review of Systems: Constitutional:  Negative for fever, chills, night sweats, anorexia, weight loss, pain. Cardiovascular: no chest pain or dyspnea on exertion Respiratory: no cough, shortness of breath, or wheezing Neurological: negative Dermatological: negative ENT: negative Skin Gastrointestinal: no abdominal pain, change in bowel habits, or black or bloody stools Genito-Urinary: no dysuria, trouble voiding, or hematuria Hematological and Lymphatic: negative Breast: negative for breast lumps Musculoskeletal: negative Remaining ROS negative.  Physical Exam: Blood pressure 143/84, pulse 85, temperature 97.9 F (36.6 C), height 5' 4.5" (1.638 m), weight 144 lb 12.8 oz (65.681 kg). ECOG: 0 General appearance: alert, cooperative and appears stated age Head: Normocephalic, without obvious abnormality, atraumatic Neck: no adenopathy, no carotid bruit, no JVD, supple, symmetrical, trachea midline and thyroid not enlarged, symmetric, no tenderness/mass/nodules Lymph nodes: Cervical, supraclavicular, and axillary nodes normal. Cardiac : regular rate and rhythm, no murmurs or gallops Pulmonary:clear to auscultation bilaterally and normal percussion bilaterally Breasts: inspection negative, no nipple discharge or bleeding, no masses or nodularity palpable and rt breast-nl;s/p lt  mrm-CW exam unremarkable Abdomen:soft, non-tender; bowel sounds normal; no masses,  no organomegaly Extremities negative Neuro: alert, oriented, normal speech, no focal findings or movement disorder noted  Lab Results: Lab Results  Component Value Date   WBC 6.4 02/12/2012   HGB 12.2 02/12/2012   HCT 36.4 02/12/2012   MCV 88.1 02/12/2012   PLT 225 02/12/2012     Chemistry      Component Value Date/Time   NA 140 08/17/2011 0445   K 3.3* 08/17/2011 0445   CL 112 08/17/2011 0445   CO2 17* 08/17/2011 0445   BUN 7 08/17/2011 0445   CREATININE 0.54 08/17/2011 0445      Component Value Date/Time   CALCIUM 8.5 08/17/2011 0445   ALKPHOS 60 08/17/2011 0445   AST 17 08/17/2011 0445   ALT 37* 08/17/2011 0445   BILITOT 0.2* 08/17/2011 0445      .pathology. Radiological Studies: chest X-ray n/a Mammogram n/a Bone density Due   Impression and Plan: [name] is doing well and is clinically free of diseasae . She will remain on femara indefinately.  I will see her in 3 months with appropriate imaging studies.  More than 50% of the visit was spent in patient -related counselling   Pierce Crane, MD 3/21/201312:27 PM

## 2012-02-12 NOTE — Telephone Encounter (Signed)
gave patient appointment for 02-13-2012 at 11:40am at the [facility] for bone density gave patient appointment for 07-2012 for six month follow up printed out calendar and gave to the patient

## 2012-02-13 ENCOUNTER — Telehealth: Payer: Self-pay | Admitting: *Deleted

## 2012-02-13 ENCOUNTER — Other Ambulatory Visit: Payer: Self-pay | Admitting: *Deleted

## 2012-02-13 DIAGNOSIS — Z780 Asymptomatic menopausal state: Secondary | ICD-10-CM

## 2012-02-13 LAB — COMPREHENSIVE METABOLIC PANEL
Albumin: 4.4 g/dL (ref 3.5–5.2)
Alkaline Phosphatase: 57 U/L (ref 39–117)
BUN: 13 mg/dL (ref 6–23)
Calcium: 9.3 mg/dL (ref 8.4–10.5)
Chloride: 107 mEq/L (ref 96–112)
Creatinine, Ser: 0.69 mg/dL (ref 0.50–1.10)
Glucose, Bld: 86 mg/dL (ref 70–99)
Potassium: 3.7 mEq/L (ref 3.5–5.3)

## 2012-02-13 LAB — VITAMIN D 25 HYDROXY (VIT D DEFICIENCY, FRACTURES): Vit D, 25-Hydroxy: 55 ng/mL (ref 30–89)

## 2012-02-13 NOTE — Telephone Encounter (Signed)
Faxed signed order for bone density test to [phone].

## 2012-03-02 DIAGNOSIS — Z13820 Encounter for screening for osteoporosis: Secondary | ICD-10-CM | POA: Diagnosis not present

## 2012-03-17 DIAGNOSIS — E785 Hyperlipidemia, unspecified: Secondary | ICD-10-CM | POA: Diagnosis not present

## 2012-03-17 DIAGNOSIS — R05 Cough: Secondary | ICD-10-CM | POA: Diagnosis not present

## 2012-03-17 DIAGNOSIS — G4700 Insomnia, unspecified: Secondary | ICD-10-CM | POA: Diagnosis not present

## 2012-03-17 DIAGNOSIS — I10 Essential (primary) hypertension: Secondary | ICD-10-CM | POA: Diagnosis not present

## 2012-03-19 ENCOUNTER — Other Ambulatory Visit: Payer: Self-pay | Admitting: *Deleted

## 2012-03-19 DIAGNOSIS — C50419 Malignant neoplasm of upper-outer quadrant of unspecified female breast: Secondary | ICD-10-CM

## 2012-03-19 MED ORDER — LETROZOLE 2.5 MG PO TABS: 2.5000 mg | ORAL_TABLET | Freq: Every day | ORAL | Status: DC

## 2012-06-16 DIAGNOSIS — H43399 Other vitreous opacities, unspecified eye: Secondary | ICD-10-CM | POA: Diagnosis not present

## 2012-06-16 DIAGNOSIS — H409 Unspecified glaucoma: Secondary | ICD-10-CM | POA: Diagnosis not present

## 2012-06-16 DIAGNOSIS — H4011X0 Primary open-angle glaucoma, stage unspecified: Secondary | ICD-10-CM | POA: Diagnosis not present

## 2012-06-16 DIAGNOSIS — H02409 Unspecified ptosis of unspecified eyelid: Secondary | ICD-10-CM | POA: Diagnosis not present

## 2012-06-16 DIAGNOSIS — H2510 Age-related nuclear cataract, unspecified eye: Secondary | ICD-10-CM | POA: Diagnosis not present

## 2012-06-23 DIAGNOSIS — H35369 Drusen (degenerative) of macula, unspecified eye: Secondary | ICD-10-CM | POA: Diagnosis not present

## 2012-06-23 DIAGNOSIS — H2510 Age-related nuclear cataract, unspecified eye: Secondary | ICD-10-CM | POA: Diagnosis not present

## 2012-06-23 DIAGNOSIS — H43399 Other vitreous opacities, unspecified eye: Secondary | ICD-10-CM | POA: Diagnosis not present

## 2012-06-23 DIAGNOSIS — H4011X0 Primary open-angle glaucoma, stage unspecified: Secondary | ICD-10-CM | POA: Diagnosis not present

## 2012-06-24 ENCOUNTER — Encounter: Payer: Medicare Other | Admitting: Obstetrics and Gynecology

## 2012-06-30 ENCOUNTER — Encounter: Payer: Self-pay | Admitting: Obstetrics and Gynecology

## 2012-06-30 ENCOUNTER — Ambulatory Visit (INDEPENDENT_AMBULATORY_CARE_PROVIDER_SITE_OTHER): Payer: Medicare Other | Admitting: Obstetrics and Gynecology

## 2012-06-30 VITALS — BP 130/80 | Ht 64.0 in | Wt 140.0 lb

## 2012-06-30 DIAGNOSIS — Z780 Asymptomatic menopausal state: Secondary | ICD-10-CM

## 2012-06-30 DIAGNOSIS — C50919 Malignant neoplasm of unspecified site of unspecified female breast: Secondary | ICD-10-CM

## 2012-06-30 DIAGNOSIS — R351 Nocturia: Secondary | ICD-10-CM | POA: Diagnosis not present

## 2012-06-30 DIAGNOSIS — N952 Postmenopausal atrophic vaginitis: Secondary | ICD-10-CM | POA: Diagnosis not present

## 2012-06-30 NOTE — Progress Notes (Signed)
Patient came to see me for followup. Last year as a result of gene testing showing she was BRCA2 positive we went ahead and operated  on her to remove her ovaries. She had dense pelvic adhesions and required a laparotomy. We were never able to find her right ovary and her left ovary was normal. We cannot get a history that she had her right ovary previously removed and she is aware of the fact that she may have an ovarian remnant on the right. She is due for her yearly mammogram in September. She remains on aromatase inhibitors for her breast cancer. She has not had a recurrence. She had a followup bone density this spring and continues with osteopenia without an elevated FRAX risk. She does have sleep disturbance which may be menopausally related and her PCP gives her sleep  medication. When she wakes up she voids so she  has nocturia x2. It is unclear whether the voiding is just because she is awake. She does not have dysuria, urgency or urinary incontinence. She is having no vaginal bleeding. She is having no pelvic pain. She has always had normal Pap smears. Her last Pap smear was 2011. She does have vaginal dryness but Dr. Donnie Coffin does not want her to take vaginal estrogen.  ROS: 12 system review done. Pertinent positives above. Other positives include hypertension and hyperlipidemia.  Physical examination:theHEENT within normal limits. Neck: Thyroid not large. No masses. Supraclavicular nodes: not enlarged. Breasts: Examined in both sitting and lying  position. No skin changes and no masses in right breast. Left mastectomy with no masses. Abdomen: Soft no guarding rebound or masses or hernia. Pelvic: External: Within normal limits. BUS: Within normal limits. Vaginal:within normal limits. poor estrogen effect. No evidence of cystocele rectocele or enterocele. Cervix: clean. Uterus: Normal size and shape. Adnexa: No masses. Rectovaginal exam: Confirmatory and negative. Extremities: Within normal  limits.  Assessment: #1. Atrophic vaginitis #2. Breast cancer #3. Nocturia #4. Sleep disturbance which may be menopausal #5. BRCA2 with removal of left ovary and tube and inability to find right ovary and tube at the time of surgery.  Plan: Replens  for vaginal dryness. Mammogram in September. Continue periodic bone densities.The new Pap smear guidelines were discussed with the patient. No pap done.

## 2012-06-30 NOTE — Patient Instructions (Signed)
Please get your  mammogram in September.

## 2012-08-12 DIAGNOSIS — Z1231 Encounter for screening mammogram for malignant neoplasm of breast: Secondary | ICD-10-CM | POA: Diagnosis not present

## 2012-08-17 ENCOUNTER — Encounter: Payer: Self-pay | Admitting: Obstetrics and Gynecology

## 2012-08-18 ENCOUNTER — Telehealth: Payer: Self-pay | Admitting: Oncology

## 2012-08-18 ENCOUNTER — Other Ambulatory Visit: Payer: Medicare Other

## 2012-08-18 ENCOUNTER — Ambulatory Visit: Payer: Medicare Other | Admitting: Family

## 2012-08-18 NOTE — Telephone Encounter (Signed)
Moved 9/25 appt to 11/11. S/w pt she is aware.

## 2012-08-20 ENCOUNTER — Other Ambulatory Visit: Payer: Medicare Other | Admitting: Lab

## 2012-09-09 DIAGNOSIS — E785 Hyperlipidemia, unspecified: Secondary | ICD-10-CM | POA: Diagnosis not present

## 2012-09-09 DIAGNOSIS — I10 Essential (primary) hypertension: Secondary | ICD-10-CM | POA: Diagnosis not present

## 2012-09-09 DIAGNOSIS — Z79899 Other long term (current) drug therapy: Secondary | ICD-10-CM | POA: Diagnosis not present

## 2012-09-16 DIAGNOSIS — Z23 Encounter for immunization: Secondary | ICD-10-CM | POA: Diagnosis not present

## 2012-09-16 DIAGNOSIS — Z0000 Encounter for general adult medical examination without abnormal findings: Secondary | ICD-10-CM | POA: Diagnosis not present

## 2012-09-16 DIAGNOSIS — I10 Essential (primary) hypertension: Secondary | ICD-10-CM | POA: Diagnosis not present

## 2012-09-16 DIAGNOSIS — E785 Hyperlipidemia, unspecified: Secondary | ICD-10-CM | POA: Diagnosis not present

## 2012-09-16 DIAGNOSIS — G4700 Insomnia, unspecified: Secondary | ICD-10-CM | POA: Diagnosis not present

## 2012-09-20 DIAGNOSIS — Z1212 Encounter for screening for malignant neoplasm of rectum: Secondary | ICD-10-CM | POA: Diagnosis not present

## 2012-10-01 DIAGNOSIS — H4011X0 Primary open-angle glaucoma, stage unspecified: Secondary | ICD-10-CM | POA: Diagnosis not present

## 2012-10-01 DIAGNOSIS — H409 Unspecified glaucoma: Secondary | ICD-10-CM | POA: Diagnosis not present

## 2012-10-04 ENCOUNTER — Telehealth: Payer: Self-pay | Admitting: *Deleted

## 2012-10-04 ENCOUNTER — Ambulatory Visit (HOSPITAL_BASED_OUTPATIENT_CLINIC_OR_DEPARTMENT_OTHER): Payer: Medicare Other | Admitting: Oncology

## 2012-10-04 ENCOUNTER — Other Ambulatory Visit (HOSPITAL_BASED_OUTPATIENT_CLINIC_OR_DEPARTMENT_OTHER): Payer: Medicare Other | Admitting: Lab

## 2012-10-04 VITALS — BP 112/72 | HR 85 | Temp 98.4°F | Resp 20 | Ht 64.0 in | Wt 141.0 lb

## 2012-10-04 DIAGNOSIS — E559 Vitamin D deficiency, unspecified: Secondary | ICD-10-CM

## 2012-10-04 DIAGNOSIS — Z853 Personal history of malignant neoplasm of breast: Secondary | ICD-10-CM

## 2012-10-04 DIAGNOSIS — Z170 Estrogen receptor positive status [ER+]: Secondary | ICD-10-CM

## 2012-10-04 DIAGNOSIS — C50419 Malignant neoplasm of upper-outer quadrant of unspecified female breast: Secondary | ICD-10-CM | POA: Diagnosis not present

## 2012-10-04 DIAGNOSIS — C50919 Malignant neoplasm of unspecified site of unspecified female breast: Secondary | ICD-10-CM

## 2012-10-04 LAB — COMPREHENSIVE METABOLIC PANEL (CC13)
ALT: 22 U/L (ref 0–55)
AST: 19 U/L (ref 5–34)
Albumin: 4 g/dL (ref 3.5–5.0)
Alkaline Phosphatase: 61 U/L (ref 40–150)
Potassium: 3.6 mEq/L (ref 3.5–5.1)
Sodium: 143 mEq/L (ref 136–145)
Total Protein: 7.6 g/dL (ref 6.4–8.3)

## 2012-10-04 LAB — CBC WITH DIFFERENTIAL/PLATELET
BASO%: 0.3 % (ref 0.0–2.0)
MCHC: 33.2 g/dL (ref 31.5–36.0)
MONO#: 0.5 10*3/uL (ref 0.1–0.9)
RBC: 4.23 10*6/uL (ref 3.70–5.45)
WBC: 6.7 10*3/uL (ref 3.9–10.3)
lymph#: 2.7 10*3/uL (ref 0.9–3.3)

## 2012-10-04 MED ORDER — TAMOXIFEN CITRATE 20 MG PO TABS: 20.0000 mg | ORAL_TABLET | Freq: Every day | ORAL | Status: DC

## 2012-10-04 NOTE — Telephone Encounter (Signed)
Gave patient instructions that some will be calling her and scheduling her mri appointment  Gave patient appointment for six months

## 2012-10-04 NOTE — Progress Notes (Signed)
Hematology and Oncology Follow Up Visit  [name] [id number] 04-Jul-1946 66 y.o. 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  Interim History:  There have been no intercurrent illness, hospitalizations or medication changes.She is due for a bone density test and colonoscopy. S/p oophorectomy 10/12, she stopped her Femara about 3 months ago after having back pain. She thought she had a urinary tract infection and then stop the Femara and is dramatically improved. So she has been off this for a few months. She feels otherwise well. She has had a mammogram is not an MRI scan of her right breast.  Medications: I have reviewed the patient's current medications.  Allergies: No Known Allergies  Past Medical History, Surgical history, Social history, and Family History were reviewed and updated.  Review of Systems: Constitutional:  Negative for fever, chills, night sweats, anorexia, weight loss, pain. Cardiovascular: no chest pain or dyspnea on exertion Respiratory: no cough, shortness of breath, or wheezing Neurological: negative Dermatological: negative ENT: negative Skin Gastrointestinal: no abdominal pain, change in bowel habits, or black or bloody stools Genito-Urinary: no dysuria, trouble voiding, or hematuria Hematological and Lymphatic: negative Breast: negative for breast lumps Musculoskeletal: negative Remaining ROS negative.  Physical Exam: Blood pressure 112/72, pulse 85, temperature 98.4 F (36.9 C), resp. rate 20, height 5\' 4"  (1.626 m), weight 141 lb (63.957 kg). ECOG: 0 General appearance: alert, cooperative and appears stated age Head: Normocephalic, without obvious abnormality, atraumatic Neck: no adenopathy, no carotid bruit, no JVD, supple, symmetrical, trachea midline and thyroid not enlarged, symmetric, no tenderness/mass/nodules Lymph nodes:  Cervical, supraclavicular, and axillary nodes normal. Cardiac : regular rate and rhythm, no murmurs or gallops Pulmonary:clear to auscultation bilaterally and normal percussion bilaterally Breasts: inspection negative, no nipple discharge or bleeding, no masses or nodularity palpable and rt breast-nl;s/p lt mrm-CW exam unremarkable Abdomen:soft, non-tender; bowel sounds normal; no masses,  no organomegaly Extremities negative Neuro: alert, oriented, normal speech, no focal findings or movement disorder noted  Lab Results: Lab Results  Component Value Date   WBC 6.7 10/04/2012   HGB 12.6 10/04/2012   HCT 37.9 10/04/2012   MCV 89.6 10/04/2012   PLT 231 10/04/2012     Chemistry      Component Value Date/Time   NA 143 10/04/2012 1055   NA 143 02/12/2012 1121   K 3.6 10/04/2012 1055   K 3.7 02/12/2012 1121   CL 108* 10/04/2012 1055   CL 107 02/12/2012 1121   CO2 28 10/04/2012 1055   CO2 27 02/12/2012 1121   BUN 13.0 10/04/2012 1055   BUN 13 02/12/2012 1121   CREATININE 0.8 10/04/2012 1055   CREATININE 0.69 02/12/2012 1121      Component Value Date/Time   CALCIUM 10.0 10/04/2012 1055   CALCIUM 9.3 02/12/2012 1121   ALKPHOS 61 10/04/2012 1055   ALKPHOS 57 02/12/2012 1121   AST 19 10/04/2012 1055   AST 24 02/12/2012 1121   ALT 22 10/04/2012 1055   ALT 28 02/12/2012 1121   BILITOT 0.69 10/04/2012 1055   BILITOT 0.6 02/12/2012 1121      .pathology. Radiological Studies: chest X-ray n/a Mammogram n/a Bone density Due   Impression and Plan: [name] is doing well and is clinically free of diseasae .I discussed with her tamoxifen and discontinuing the Femara.. I discussed side effects. She has a  uterus and does not have a  history of blood clots. I will plan to see her back in followup in about 3-6 months. I I have scheduled her MRI scan as well to   More than 50% of the visit was spent in patient -related counselling   Pierce Crane, MD 11/11/201312:24 PM

## 2012-10-05 LAB — CANCER ANTIGEN 27.29: CA 27.29: 33 U/mL (ref 0–39)

## 2012-10-05 LAB — VITAMIN D 25 HYDROXY (VIT D DEFICIENCY, FRACTURES): Vit D, 25-Hydroxy: 53 ng/mL (ref 30–89)

## 2012-10-14 ENCOUNTER — Ambulatory Visit
Admission: RE | Admit: 2012-10-14 | Discharge: 2012-10-14 | Disposition: A | Discharge status: Home / Self Care | Payer: Medicare Other | Source: Ambulatory Visit | Attending: Oncology | Admitting: Oncology

## 2012-10-14 DIAGNOSIS — Z803 Family history of malignant neoplasm of breast: Secondary | ICD-10-CM | POA: Diagnosis not present

## 2012-10-14 DIAGNOSIS — C50919 Malignant neoplasm of unspecified site of unspecified female breast: Secondary | ICD-10-CM

## 2012-10-14 DIAGNOSIS — Z853 Personal history of malignant neoplasm of breast: Secondary | ICD-10-CM | POA: Diagnosis not present

## 2012-10-14 MED ORDER — GADOBENATE DIMEGLUMINE 529 MG/ML IV SOLN
13.0000 mL | Freq: Once | INTRAVENOUS | Status: AC | PRN
  Administered 2012-10-14: 13 mL via INTRAVENOUS

## 2012-10-20 ENCOUNTER — Encounter: Payer: Self-pay | Admitting: Obstetrics and Gynecology

## 2012-10-20 ENCOUNTER — Ambulatory Visit (INDEPENDENT_AMBULATORY_CARE_PROVIDER_SITE_OTHER): Payer: Medicare Other | Admitting: Obstetrics and Gynecology

## 2012-10-20 DIAGNOSIS — N949 Unspecified condition associated with female genital organs and menstrual cycle: Secondary | ICD-10-CM

## 2012-10-20 DIAGNOSIS — R350 Frequency of micturition: Secondary | ICD-10-CM

## 2012-10-20 DIAGNOSIS — R102 Pelvic and perineal pain: Secondary | ICD-10-CM

## 2012-10-20 LAB — URINALYSIS W MICROSCOPIC + REFLEX CULTURE
Bilirubin Urine: NEGATIVE
Ketones, ur: NEGATIVE mg/dL
Protein, ur: NEGATIVE mg/dL
Specific Gravity, Urine: 1.02 (ref 1.005–1.030)
Urobilinogen, UA: 1 mg/dL (ref 0.0–1.0)

## 2012-10-20 MED ORDER — DOXYCYCLINE HYCLATE 100 MG PO TABS: 100.0000 mg | ORAL_TABLET | Freq: Two times a day (BID) | ORAL | Status: DC

## 2012-10-20 NOTE — Progress Notes (Signed)
Patient had been on Femara by Dr. Caron Presume for adjunctive therapy for breast cancer. It was changed to tamoxifen approximately one week ago due to side effects. Ever since she started the tamoxifen she has had extreme urinary  Frequency  and lower back pain.She is seen no vaginal bleeding. In September, 2012 the patient had a laparotomy to remove both ovaries because of BRCA2. Her left ovary and fallopian tube were removed but we never could find her right ovary and fallopian tube in spite of doing a laparotomy. The assumption was that it had been removed at the time of one of her tubal pregnancies. Clearly however it might still be there. The patient stopped her tamoxifen yesterday and already feels a little bit better but is having extreme frequency of urination. Urinalysis today was normal.  Exam: [name] present. Abdomen is soft without guarding rebound or masses. External and vaginal exam within normal limits. Cervix is clean. Uterus is normal size and shape but extremely tender. Adnexa fails to reveal masses. Rectovaginal exam is confirmatory.  Assessment: #1. Urinary frequency #2. Back and pelvic pain since starting tamoxifen.  Plan: Urine culture done. Ultrasound scheduled. Doxycycline 100 mg twice a day for 7 days. Urogesic 4 times a day.

## 2012-10-20 NOTE — Patient Instructions (Signed)
Schedule pelvic ultrasound

## 2012-10-22 LAB — URINE CULTURE

## 2012-10-25 ENCOUNTER — Other Ambulatory Visit: Payer: Self-pay | Admitting: Obstetrics and Gynecology

## 2012-10-25 ENCOUNTER — Ambulatory Visit (INDEPENDENT_AMBULATORY_CARE_PROVIDER_SITE_OTHER): Payer: Medicare Other | Admitting: Obstetrics and Gynecology

## 2012-10-25 ENCOUNTER — Ambulatory Visit (INDEPENDENT_AMBULATORY_CARE_PROVIDER_SITE_OTHER): Payer: Medicare Other

## 2012-10-25 DIAGNOSIS — N9489 Other specified conditions associated with female genital organs and menstrual cycle: Secondary | ICD-10-CM

## 2012-10-25 DIAGNOSIS — B373 Candidiasis of vulva and vagina: Secondary | ICD-10-CM

## 2012-10-25 DIAGNOSIS — M545 Low back pain, unspecified: Secondary | ICD-10-CM

## 2012-10-25 DIAGNOSIS — Z853 Personal history of malignant neoplasm of breast: Secondary | ICD-10-CM | POA: Diagnosis not present

## 2012-10-25 DIAGNOSIS — N949 Unspecified condition associated with female genital organs and menstrual cycle: Secondary | ICD-10-CM | POA: Diagnosis not present

## 2012-10-25 DIAGNOSIS — R1031 Right lower quadrant pain: Secondary | ICD-10-CM

## 2012-10-25 DIAGNOSIS — B3731 Acute candidiasis of vulva and vagina: Secondary | ICD-10-CM

## 2012-10-25 DIAGNOSIS — R102 Pelvic and perineal pain: Secondary | ICD-10-CM

## 2012-10-25 DIAGNOSIS — N719 Inflammatory disease of uterus, unspecified: Secondary | ICD-10-CM | POA: Diagnosis not present

## 2012-10-25 DIAGNOSIS — N840 Polyp of corpus uteri: Secondary | ICD-10-CM

## 2012-10-25 MED ORDER — TERCONAZOLE 0.8 % VA CREA: 1.0000 | TOPICAL_CREAM | Freq: Every day | VAGINAL | Status: DC

## 2012-10-25 NOTE — Patient Instructions (Signed)
We will call you with time for surgery.

## 2012-10-25 NOTE — Progress Notes (Signed)
Patient came back today for ultrasound for assessment of her pain. Last week we had  placed her On doxycycline. She said her pain has disappeared but she is now having vaginal discharge with itching. Her urine culture showed Group B streptococcus but cultures were not done since it is uniformly sensitive to penicillin. She is on tamoxifen for her breast cancer. We had removed Her left ovary because she was BRACA-2 positive but we were never able to find her right ovary( see operative note). Our suspicion was that was removed previously when she had an ectopic pregnancy. One of the reasons for ultrasound was to be sure she did not have an ovarian mass causing the pain.  On ultrasound today the patient has a retroverted uterus with a prominent endometrial cavity. Her endometrial echo is 7.3 mm. No masses could be seen in either adnexa. There was no evidence of either ovary. There was a mass seen in the endometrial cavity. Therefore saline infusion histogram was done. There was a greater than 1 cm mass seen on the right endometrial cavity wall. Endometrial biopsy was done. During the speculum exam the patient had an obvious yeast infection.  Assessment: #1. Pelvic pain now resolved. #2. Urinary tract infection. #3. 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 #4. Yeast vaginitis.  Plan: Terconazole 3 cream. We will do followup urine culture after patient finishes  Doxycycline. With the patient on tamoxifen I think we need to do a hysteroscopy with excision of this mass to be sure it is benign. We will schedule.

## 2012-10-26 ENCOUNTER — Other Ambulatory Visit: Payer: Self-pay | Admitting: Obstetrics and Gynecology

## 2012-10-26 ENCOUNTER — Telehealth: Payer: Self-pay | Admitting: Obstetrics and Gynecology

## 2012-10-26 ENCOUNTER — Encounter: Payer: Self-pay | Admitting: Gynecology

## 2012-10-26 MED ORDER — MISOPROSTOL 200 MCG PO TABS: ORAL_TABLET | ORAL | Status: DC

## 2012-10-26 NOTE — Telephone Encounter (Signed)
Patient informed surgery scheduled for Dec 13 8:45am at [facility].  Pamphlet mailed. Patient instructed regarding Cytotec vaginally hs before and Rx was called to pharmacy.

## 2012-10-26 NOTE — Telephone Encounter (Signed)
Left message for patient to call to discuss surgery scheduled.

## 2012-11-02 ENCOUNTER — Encounter (HOSPITAL_BASED_OUTPATIENT_CLINIC_OR_DEPARTMENT_OTHER): Payer: Self-pay | Admitting: *Deleted

## 2012-11-02 NOTE — Progress Notes (Signed)
NPO AFTER MN. ARRIVES AT 0730.  LABS CBC AND BMET TO BE DONE ON 11-03-2012. NEEDS EKG.

## 2012-11-03 DIAGNOSIS — C50919 Malignant neoplasm of unspecified site of unspecified female breast: Secondary | ICD-10-CM | POA: Diagnosis not present

## 2012-11-03 DIAGNOSIS — D250 Submucous leiomyoma of uterus: Secondary | ICD-10-CM | POA: Diagnosis not present

## 2012-11-03 DIAGNOSIS — Z79899 Other long term (current) drug therapy: Secondary | ICD-10-CM | POA: Diagnosis not present

## 2012-11-03 LAB — CBC
HCT: 36.3 % (ref 36.0–46.0)
Hemoglobin: 12.2 g/dL (ref 12.0–15.0)
MCH: 29.5 pg (ref 26.0–34.0)
MCHC: 33.6 g/dL (ref 30.0–36.0)
RDW: 14.4 % (ref 11.5–15.5)

## 2012-11-03 LAB — BASIC METABOLIC PANEL
BUN: 13 mg/dL (ref 6–23)
Chloride: 105 mEq/L (ref 96–112)
Creatinine, Ser: 0.79 mg/dL (ref 0.50–1.10)
GFR calc Af Amer: >90 mL/min (ref >90)
Glucose, Bld: 91 mg/dL (ref 70–99)
Potassium: 3.5 mEq/L (ref 3.5–5.1)

## 2012-11-03 NOTE — H&P (Signed)
  Chief complaint: endometrial cavity mass in patient on tamoxifen HPI: Patient is a 66 year old gravida 2 para 0 ab 2 (ectopic pregnancy x 2) who presented to the office with pelvic pain. On ultrasound the patient had a greater than 1 cm lesion in her endometrial cavity.Pastient had a previous ultrasound in 2012 and the mass was NOT present then.  It was felt that this needed excision for diagnois due to the fact that patient has breast cancer and is currently taking tamoifen which has been associated with endometrial cancer. She now enters for hysterscopy and excision of mass.   Past medical history, family history, social history and review of symptoms in epic record and reviewed.   Physical examination: HEENT within normal limits. Neck: Thyroid not large. No masses. Supraclavicular nodes: not enlarged. Breasts: Examined in both sitting and lying  position. No skin changes and no masses. Abdomen: Soft no guarding rebound or masses or hernia. Pelvic: External: Within normal limits. BUS: Within normal limits. Vaginal:within normal limits. Good estrogen effect. No evidence of cystocele rectocele or enterocele. Cervix: clean. Uterus: Normal size and shape. Adnexa: No masses. Rectovaginal exam: Confirmatory and negative. Extremities: Within normal limits.   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

## 2012-11-05 ENCOUNTER — Encounter (HOSPITAL_BASED_OUTPATIENT_CLINIC_OR_DEPARTMENT_OTHER)
Admission: RE | Disposition: A | Discharge status: Home / Self Care | Payer: Self-pay | Source: Ambulatory Visit | Attending: Obstetrics and Gynecology

## 2012-11-05 ENCOUNTER — Encounter (HOSPITAL_BASED_OUTPATIENT_CLINIC_OR_DEPARTMENT_OTHER): Payer: Self-pay | Admitting: Anesthesiology

## 2012-11-05 ENCOUNTER — Other Ambulatory Visit: Payer: Self-pay

## 2012-11-05 ENCOUNTER — Ambulatory Visit (HOSPITAL_BASED_OUTPATIENT_CLINIC_OR_DEPARTMENT_OTHER): Payer: Medicare Other | Admitting: Anesthesiology

## 2012-11-05 ENCOUNTER — Ambulatory Visit (HOSPITAL_BASED_OUTPATIENT_CLINIC_OR_DEPARTMENT_OTHER)
Admission: RE | Admit: 2012-11-05 | Discharge: 2012-11-05 | Disposition: A | Discharge status: Home / Self Care | Payer: Medicare Other | Source: Ambulatory Visit | Attending: Obstetrics and Gynecology | Admitting: Obstetrics and Gynecology

## 2012-11-05 ENCOUNTER — Encounter (HOSPITAL_BASED_OUTPATIENT_CLINIC_OR_DEPARTMENT_OTHER): Payer: Self-pay | Admitting: *Deleted

## 2012-11-05 DIAGNOSIS — C50919 Malignant neoplasm of unspecified site of unspecified female breast: Secondary | ICD-10-CM | POA: Insufficient documentation

## 2012-11-05 DIAGNOSIS — N840 Polyp of corpus uteri: Secondary | ICD-10-CM | POA: Diagnosis not present

## 2012-11-05 DIAGNOSIS — N9489 Other specified conditions associated with female genital organs and menstrual cycle: Secondary | ICD-10-CM | POA: Diagnosis not present

## 2012-11-05 DIAGNOSIS — D259 Leiomyoma of uterus, unspecified: Secondary | ICD-10-CM

## 2012-11-05 DIAGNOSIS — D250 Submucous leiomyoma of uterus: Secondary | ICD-10-CM

## 2012-11-05 DIAGNOSIS — Z79899 Other long term (current) drug therapy: Secondary | ICD-10-CM | POA: Insufficient documentation

## 2012-11-05 HISTORY — DX: Unspecified glaucoma: H40.9

## 2012-11-05 HISTORY — DX: Hyperlipidemia, unspecified: E78.5

## 2012-11-05 HISTORY — PX: HYSTEROSCOPY WITH D & C: SHX1775

## 2012-11-05 HISTORY — DX: Personal history of urinary (tract) infections: Z87.440

## 2012-11-05 SURGERY — DILATATION AND CURETTAGE /HYSTEROSCOPY
Anesthesia: General | Site: Vagina | Wound class: Clean Contaminated

## 2012-11-05 MED ORDER — KETOROLAC TROMETHAMINE 30 MG/ML IJ SOLN
15.0000 mg | Freq: Once | INTRAMUSCULAR | Status: DC | PRN
  Filled 2012-11-05: qty 1

## 2012-11-05 MED ORDER — LACTATED RINGERS IV SOLN
INTRAVENOUS | Status: DC
  Administered 2012-11-05: 09:00:00 via INTRAVENOUS
  Administered 2012-11-05: 100 mL/h via INTRAVENOUS
  Filled 2012-11-05: qty 1000

## 2012-11-05 MED ORDER — PROPOFOL 10 MG/ML IV BOLUS
INTRAVENOUS | Status: DC | PRN
  Administered 2012-11-05: 200 mg via INTRAVENOUS

## 2012-11-05 MED ORDER — FENTANYL CITRATE 0.05 MG/ML IJ SOLN
INTRAMUSCULAR | Status: DC | PRN
  Administered 2012-11-05: 25 ug via INTRAVENOUS
  Administered 2012-11-05: 50 ug via INTRAVENOUS

## 2012-11-05 MED ORDER — EPHEDRINE SULFATE 50 MG/ML IJ SOLN
INTRAMUSCULAR | Status: DC | PRN
  Administered 2012-11-05 (×2): 10 mg via INTRAVENOUS

## 2012-11-05 MED ORDER — GLYCINE 1.5 % IR SOLN
Status: DC | PRN
  Administered 2012-11-05: 3000 mL

## 2012-11-05 MED ORDER — PROMETHAZINE HCL 25 MG/ML IJ SOLN
6.2500 mg | INTRAMUSCULAR | Status: DC | PRN
  Filled 2012-11-05: qty 1

## 2012-11-05 MED ORDER — KETOROLAC TROMETHAMINE 30 MG/ML IJ SOLN
INTRAMUSCULAR | Status: DC | PRN
  Administered 2012-11-05: 30 mg via INTRAVENOUS

## 2012-11-05 MED ORDER — DEXAMETHASONE SODIUM PHOSPHATE 4 MG/ML IJ SOLN
INTRAMUSCULAR | Status: DC | PRN
  Administered 2012-11-05: 10 mg via INTRAVENOUS

## 2012-11-05 MED ORDER — ONDANSETRON HCL 4 MG/2ML IJ SOLN
INTRAMUSCULAR | Status: DC | PRN
  Administered 2012-11-05: 4 mg via INTRAVENOUS

## 2012-11-05 MED ORDER — FENTANYL CITRATE 0.05 MG/ML IJ SOLN
25.0000 ug | INTRAMUSCULAR | Status: DC | PRN
  Filled 2012-11-05: qty 1

## 2012-11-05 MED ORDER — CEFAZOLIN SODIUM-DEXTROSE 2-3 GM-% IV SOLR
2.0000 g | INTRAVENOUS | Status: AC
  Administered 2012-11-05: 2 g via INTRAVENOUS
  Filled 2012-11-05: qty 50

## 2012-11-05 MED ORDER — LIDOCAINE HCL (CARDIAC) 20 MG/ML IV SOLN
INTRAVENOUS | Status: DC | PRN
  Administered 2012-11-05: 60 mg via INTRAVENOUS

## 2012-11-05 SURGICAL SUPPLY — 27 items
CANISTER SUCTION 2500CC (MISCELLANEOUS) ×2 IMPLANT
CATH ROBINSON RED A/P 16FR (CATHETERS) IMPLANT
CLOTH BEACON ORANGE TIMEOUT ST (SAFETY) ×2 IMPLANT
CORD ACTIVE DISPOSABLE (ELECTRODE) ×1
CORD ELECTRO ACTIVE DISP (ELECTRODE) ×1 IMPLANT
COVER TABLE BACK 60X90 (DRAPES) ×2 IMPLANT
DRAPE CAMERA CLOSED 9X96 (DRAPES) ×2 IMPLANT
DRAPE LG THREE QUARTER DISP (DRAPES) ×2 IMPLANT
DRESSING TELFA 8X3 (GAUZE/BANDAGES/DRESSINGS) ×2 IMPLANT
ELECT LOOP GYNE PRO 24FR (CUTTING LOOP) ×2
ELECT REM PT RETURN 9FT ADLT (ELECTROSURGICAL) ×2
ELECT VAPORTRODE GRVD BAR (ELECTRODE) IMPLANT
ELECTRODE LOOP GYNE PRO 24FR (CUTTING LOOP) ×1 IMPLANT
ELECTRODE REM PT RTRN 9FT ADLT (ELECTROSURGICAL) ×1 IMPLANT
GLOVE BIO SURGEON STRL SZ 6.5 (GLOVE) ×1 IMPLANT
GLOVE ECLIPSE 6.0 STRL STRAW (GLOVE) ×1 IMPLANT
GLOVE ECLIPSE 7.0 STRL STRAW (GLOVE) ×4 IMPLANT
GLOVE INDICATOR 7.5 STRL GRN (GLOVE) ×2 IMPLANT
GOWN PREVENTION PLUS LG XLONG (DISPOSABLE) ×3 IMPLANT
LEGGING LITHOTOMY PAIR STRL (DRAPES) ×2 IMPLANT
PACK BASIN DAY SURGERY FS (CUSTOM PROCEDURE TRAY) ×2 IMPLANT
PAD OB MATERNITY 4.3X12.25 (PERSONAL CARE ITEMS) ×2 IMPLANT
PAD PREP 24X48 CUFFED NSTRL (MISCELLANEOUS) ×2 IMPLANT
TOWEL OR 17X24 6PK STRL BLUE (TOWEL DISPOSABLE) ×4 IMPLANT
TRAY DSU PREP LF (CUSTOM PROCEDURE TRAY) ×2 IMPLANT
TUBING HYDROFLEX HYSTEROSCOPY (TUBING) ×2 IMPLANT
WATER STERILE IRR 500ML POUR (IV SOLUTION) ×2 IMPLANT

## 2012-11-05 NOTE — Anesthesia Postprocedure Evaluation (Signed)
  Anesthesia Post-op Note  Patient: [name]  Procedure(s) Performed: Procedure(s) (LRB): DILATATION AND CURETTAGE /HYSTEROSCOPY (N/A)  Patient Location: PACU  Anesthesia Type: General  Level of Consciousness: awake and alert   Airway and Oxygen Therapy: Patient Spontanous Breathing  Post-op Pain: mild  Post-op Assessment: Post-op Vital signs reviewed, Patient's Cardiovascular Status Stable, Respiratory Function Stable, Patent Airway and No signs of Nausea or vomiting  Last Vitals:  Filed Vitals:   11/05/12 0931  BP:   Pulse: 96  Temp: 37 C  Resp: 14    Post-op Vital Signs: stable   Complications: No apparent anesthesia complications

## 2012-11-05 NOTE — Op Note (Signed)
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 Postoperative diagnoses: Submucous myoma. Operation: Hysteroscopy with myomectomy. Surgeon: Dr. Eda Paschal Anesthesia: Gen.  Findings: External and vaginal examination within normal limits except for atrophic changes. Cervix was clean and slightly dilated after using Cytotec the night before surgery. Uterus was normal size and shape. Adnexa failed to reveal masses. At the time of hysteroscopy patient had a myoma coming off the anterior wall of the endometrium of 2 cm consistent with the mass seen on ultrasound. Once the mass was removed top of the fundus, Tubal ostia, anterior and posterior walls of the fundus, and endocervical canal were free of disease.  Procedure: The patient was taken to the operating room and given general anesthesia. A timeout was done. The patient was prepped and draped in usual sterile manner. The bladder was emptied with a straight catheter to obtain a urine culture as the patient had been treated previously for urinary tract infection. A single-tooth tenaculum was placed and the anterior lip of the cervix after a speculum had been placed in the vagina. The cervix was then progressively dilated to a #31 Pratt dilator. Dilatation went smoothly due to the Cytotec. A hysteroscopic resectoscope was then introduced into the uterus. One and a half percent glycine was used to  Expand the  intrauterine cavity. A 90 wire loop was attached to the resectoscope. Appropriate Bovie settings were used. The submucous myoma was identified and resected with the 90 wire loop. This was done without bleeding. It extended into the myometrium superficially and the entire fibroid could be removed. There was no bleeding noted. There was no other pathology seen. Endometrial biopsy was then obtained and sent separately to pathology along with the fibroid. Blood loss was minimal. Fluid deficit was 25 cc. Patient tolerated procedure  well and left the operating room in satisfactory condition.

## 2012-11-05 NOTE — Transfer of Care (Signed)
Immediate Anesthesia Transfer of Care Note  Patient: [name]  Procedure(s) Performed: Procedure(s) (LRB): DILATATION AND CURETTAGE /HYSTEROSCOPY (N/A)  Patient Location: Patient transported to PACU with oxygen via face mask at 4 Liters / Min  Anesthesia Type: General  Level of Consciousness: awake and alert   Airway & Oxygen Therapy: Patient Spontanous Breathing and Patient connected to face mask oxygen  Post-op Assessment: Report given to PACU RN and Post -op Vital signs reviewed and stable  Post vital signs: Reviewed and stable  Dentition: Teeth and oropharynx remain in pre-op condition  Complications: No apparent anesthesia complications

## 2012-11-05 NOTE — Anesthesia Preprocedure Evaluation (Signed)
Anesthesia Evaluation  Patient identified by MRN, date of birth, ID band Patient awake    Reviewed: Allergy & Precautions, H&P , NPO status , Patient's Chart, lab work & pertinent test results  Airway Mallampati: II TM Distance: <3 FB Neck ROM: Full    Dental No notable dental hx.    Pulmonary neg pulmonary ROS,  breath sounds clear to auscultation  Pulmonary exam normal       Cardiovascular hypertension, Pt. on medications Rhythm:Regular Rate:Normal     Neuro/Psych negative neurological ROS  negative psych ROS   GI/Hepatic negative GI ROS, Neg liver ROS,   Endo/Other  negative endocrine ROSH/O Breast Ca  Renal/GU negative Renal ROS  negative genitourinary   Musculoskeletal negative musculoskeletal ROS (+)   Abdominal   Peds negative pediatric ROS (+)  Hematology negative hematology ROS (+)   Anesthesia Other Findings   Reproductive/Obstetrics negative OB ROS                           Anesthesia Physical Anesthesia Plan  ASA: II  Anesthesia Plan: General   Post-op Pain Management:    Induction: Intravenous  Airway Management Planned: LMA  Additional Equipment:   Intra-op Plan:   Post-operative Plan:   Informed Consent: I have reviewed the patients History and Physical, chart, labs and discussed the procedure including the risks, benefits and alternatives for the proposed anesthesia with the patient or authorized representative who has indicated his/her understanding and acceptance.   Dental advisory given  Plan Discussed with: CRNA and Surgeon  Anesthesia Plan Comments:         Anesthesia Quick Evaluation

## 2012-11-05 NOTE — Anesthesia Procedure Notes (Signed)
Procedure Name: LMA Insertion Date/Time: 11/05/2012 8:52 AM Performed by: Fran Lowes Pre-anesthesia Checklist: Patient identified, Emergency Drugs available, Suction available and Patient being monitored Patient Re-evaluated:Patient Re-evaluated prior to inductionOxygen Delivery Method: Circle System Utilized Preoxygenation: Pre-oxygenation with 100% oxygen Intubation Type: IV induction Ventilation: Mask ventilation without difficulty LMA: LMA inserted LMA Size: 4.0 Number of attempts: 1 Airway Equipment and Method: bite block Placement Confirmation: positive ETCO2 Tube secured with: Tape Dental Injury: Teeth and Oropharynx as per pre-operative assessment

## 2012-11-05 NOTE — Interval H&P Note (Signed)
History and Physical Interval Note:  11/05/2012 8:23 AM  [name]  has presented today for surgery, with the diagnosis of ENDOMETRIAL CAVITY MASS WITH PATIENT ON TAMOXIFEN  The various methods of treatment have been discussed with the patient and family. After consideration of risks, benefits and other options for treatment, the patient has consented to  Procedure(s) (LRB) with comments: DILATATION AND CURETTAGE /HYSTEROSCOPY (N/A) - dr needs both resectoscopes big and little   one hour as a surgical intervention .  The patient's history has been reviewed, patient examined, no change in status, stable for surgery.  I have reviewed the patient's chart and labs.  Questions were answered to the patient's satisfaction.     Deona Novitski L

## 2012-11-05 NOTE — Op Note (Signed)
Preoperative diagnoses: Endometrial cavity defect in patient On tamoxifen. Postoperative diagnosis: Submucous myoma. Operation: Hysteroscopy with resection of submucous myoma Surgeon: Dr. Eda Paschal Anesthesia: Gen.  Findings: External and vaginal examination are within normal limits except for atrophic changes. Cervix is clean. Uterus is normal size and shape. Adnexa failed to reveal masses. At the time of hysteroscopy patient had a 2 cm myoma coming off the anterior wall of the endometrium. Other than the myoma the entire endometrial cavity could be easily inspected and was entirely normal as was the endocervical canal. The endometrium was very atrophic.  Procedure: Patient was taken to the operating room And given general anesthesia. A timeout was done. She was prepped and draped in usual sterile manner. A straight catheter was used to empty the bladder in order to get a urine culture since the patient had a previous urinary tract infection. A speculum was placed in the vagina. A single-tooth tenaculum was placed the anterior lip of the cervix. The cervix was progressively dilated to a #31 Pratt dilator. A hysteroscopic resectoscope was introduced into the uterus. 1.5% glycine was used to expand the intrauterine cavity. The camera was used for magnification. A 90 wire loop was attached to the resectoscope. Using appropriate Bovie settings and the wire-loop the myoma could be completely resected. It did enter the myometrium superficially. It was sent to pathology for tissue diagnosis. There was no bleeding noted. An endometrial biopsy was also obtained and sent to pathology for tissue diagnosis. Blood loss was minimal. Fluid deficit was 25 cc. Patient tolerated procedure well and left the operating room in satisfactory condition.

## 2012-11-06 LAB — URINE CULTURE: Colony Count: NO GROWTH

## 2012-11-08 ENCOUNTER — Encounter (HOSPITAL_BASED_OUTPATIENT_CLINIC_OR_DEPARTMENT_OTHER): Payer: Self-pay | Admitting: Obstetrics and Gynecology

## 2012-11-15 ENCOUNTER — Ambulatory Visit (INDEPENDENT_AMBULATORY_CARE_PROVIDER_SITE_OTHER): Payer: Medicare Other | Admitting: Obstetrics and Gynecology

## 2012-11-15 DIAGNOSIS — D250 Submucous leiomyoma of uterus: Secondary | ICD-10-CM

## 2012-11-15 NOTE — Patient Instructions (Signed)
Call the oncologist regarding tamoxifen. From gynecological point of view it is okay to reinitiate it.

## 2012-11-15 NOTE — Progress Notes (Signed)
Patient came back today for a postoperative visit. She had a hysteroscopy with resection of submucous myoma and removal of an endometrial polyp. Although she was not having postmenopausal bleeding the surgery was done due to an intracavitary mass which was new in a patient on tamoxifen. Pathology report was benign on both. Followup urine culture showed no growth. She has had no bleeding and no pelvic pain.  Pelvic exam: External: Within normal limits. BUS within normal limits. Vaginal exam: Within normal limits. Cervix: Clean. Uterus: Within normal limits. Adnexa: Within normal limits. Rectovaginal exam: Within normal limits.   Assessment: Normal postoperative course  Plan: Patient reassured. She'll return for yearly exam at  regular time. She'll resume her tamoxifen after discussion with the oncologist.

## 2012-12-01 DIAGNOSIS — H409 Unspecified glaucoma: Secondary | ICD-10-CM | POA: Diagnosis not present

## 2012-12-01 DIAGNOSIS — H4011X0 Primary open-angle glaucoma, stage unspecified: Secondary | ICD-10-CM | POA: Diagnosis not present

## 2012-12-06 ENCOUNTER — Telehealth: Payer: Self-pay | Admitting: Emergency Medicine

## 2012-12-06 NOTE — Telephone Encounter (Signed)
Spoke with patient; gave her new appointment date and times for 2/7 at 2:30 and 3:00 for labs and Dr Welton Flakes.

## 2012-12-16 ENCOUNTER — Encounter: Payer: Self-pay | Admitting: Oncology

## 2012-12-30 ENCOUNTER — Other Ambulatory Visit: Payer: Self-pay | Admitting: Medical Oncology

## 2012-12-30 ENCOUNTER — Other Ambulatory Visit: Payer: Medicare Other | Admitting: Lab

## 2012-12-30 ENCOUNTER — Ambulatory Visit: Payer: Medicare Other | Admitting: Oncology

## 2012-12-30 DIAGNOSIS — Z853 Personal history of malignant neoplasm of breast: Secondary | ICD-10-CM

## 2012-12-31 ENCOUNTER — Telehealth: Payer: Self-pay | Admitting: Oncology

## 2012-12-31 ENCOUNTER — Ambulatory Visit (HOSPITAL_BASED_OUTPATIENT_CLINIC_OR_DEPARTMENT_OTHER): Payer: Medicare Other | Admitting: Oncology

## 2012-12-31 ENCOUNTER — Encounter: Payer: Self-pay | Admitting: Oncology

## 2012-12-31 ENCOUNTER — Other Ambulatory Visit (HOSPITAL_BASED_OUTPATIENT_CLINIC_OR_DEPARTMENT_OTHER): Payer: Medicare Other

## 2012-12-31 VITALS — BP 110/72 | HR 74 | Temp 98.1°F | Resp 20 | Ht 65.0 in | Wt 142.0 lb

## 2012-12-31 DIAGNOSIS — C50419 Malignant neoplasm of upper-outer quadrant of unspecified female breast: Secondary | ICD-10-CM

## 2012-12-31 DIAGNOSIS — Z853 Personal history of malignant neoplasm of breast: Secondary | ICD-10-CM

## 2012-12-31 DIAGNOSIS — Z1501 Genetic susceptibility to malignant neoplasm of breast: Secondary | ICD-10-CM

## 2012-12-31 DIAGNOSIS — Z170 Estrogen receptor positive status [ER+]: Secondary | ICD-10-CM | POA: Diagnosis not present

## 2012-12-31 DIAGNOSIS — E559 Vitamin D deficiency, unspecified: Secondary | ICD-10-CM

## 2012-12-31 DIAGNOSIS — M949 Disorder of cartilage, unspecified: Secondary | ICD-10-CM | POA: Diagnosis not present

## 2012-12-31 DIAGNOSIS — Z1589 Genetic susceptibility to other disease: Secondary | ICD-10-CM

## 2012-12-31 DIAGNOSIS — M899 Disorder of bone, unspecified: Secondary | ICD-10-CM | POA: Diagnosis not present

## 2012-12-31 LAB — COMPREHENSIVE METABOLIC PANEL (CC13)
Alkaline Phosphatase: 57 U/L (ref 40–150)
BUN: 11.3 mg/dL (ref 7.0–26.0)
Glucose: 85 mg/dl (ref 70–99)
Total Bilirubin: 0.46 mg/dL (ref 0.20–1.20)

## 2012-12-31 LAB — CBC WITH DIFFERENTIAL/PLATELET
Basophils Absolute: 0 10*3/uL (ref 0.0–0.1)
Eosinophils Absolute: 0.1 10*3/uL (ref 0.0–0.5)
HCT: 36.8 % (ref 34.8–46.6)
HGB: 12.4 g/dL (ref 11.6–15.9)
LYMPH%: 38.5 % (ref 14.0–49.7)
MCV: 90.5 fL (ref 79.5–101.0)
MONO%: 7.3 % (ref 0.0–14.0)
NEUT#: 3.6 10*3/uL (ref 1.5–6.5)
NEUT%: 52.2 % (ref 38.4–76.8)
Platelets: 182 10*3/uL (ref 145–400)

## 2012-12-31 MED ORDER — RALOXIFENE HCL 60 MG PO TABS: 60.0000 mg | ORAL_TABLET | Freq: Every day | ORAL | Status: DC

## 2012-12-31 NOTE — Patient Instructions (Addendum)
Proceed with evista (raloxifene) for prevention of breast cancer and for your bones   I will see you back in 3 months  Raloxifene tablets What is this medicine? RALOXIFENE (ral OX i feen) reduces the amount of calcium lost from bones. It is used to treat and prevent osteoporosis in women who have experienced menopause. This medicine may be used for other purposes; ask your health care provider or pharmacist if you have questions. What should I tell my health care provider before I take this medicine? They need to know if you have any of these conditions: -a history of blood clots -cancer -heart failure -liver disease -premenopausal -an unusual or allergic reaction to raloxifene, other medicines, foods, dyes, or preservatives -pregnant or trying to get pregnant -breast-feeding How should I use this medicine? Take this medicine by mouth with a glass of water. Follow the directions on the prescription label. The tablets can be taken with or without food. Take your doses at regular intervals. Do not take your medicine more often than directed. Talk to your pediatrician regarding the use of this medicine in children. Special care may be needed. Overdosage: If you think you have taken too much of this medicine contact a poison control center or emergency room at once. NOTE: This medicine is only for you. Do not share this medicine with others. What if I miss a dose? If you miss a dose, take it as soon as you can. If it is almost time for your next dose, take only that dose. Do not take double or extra doses. What may interact with this medicine? -ampicillin -cholestyramine -colestipol -diazepam -diazoxide -female hormones like hormone replacement therapy -lidocaine -warfarin This list may not describe all possible interactions. Give your health care provider a list of all the medicines, herbs, non-prescription drugs, or dietary supplements you use. Also tell them if you smoke, drink  alcohol, or use illegal drugs. Some items may interact with your medicine. What should I watch for while using this medicine? Visit your doctor or health care professional for regular checks on your progress. Do not stop taking this medicine except on the advice of your doctor or health care professional. You should make sure you get enough calcium and vitamin D in your diet while you are taking this medicine. Discuss your dietary needs with your health care professional or nutritionist. Exercise may help to prevent bone loss. Discuss your exercise needs with your doctor or health care professional. This medicine can rarely cause blood clots. You should avoid long periods of bed rest while taking this medicine. If you are going to have surgery, tell your doctor or health care professional that you are taking this medicine. This medicine should be stopped at least 3 days before surgery. After surgery, it should be restarted only after you are walking again. It should not be restarted while you still need long periods of bed rest. You should not smoke while taking this medicine. Smoking may also increase your risk of blood clots. Smoking can also decrease the effects of this medicine. This medicine does not prevent hot flashes. It may cause hot flashes in some patients at the start of therapy. What side effects may I notice from receiving this medicine? Side effects that you should report to your doctor or health care professional as soon as possible: -change in vision -chest pain -difficulty breathing -leg pain or swelling -skin rash, itching Side effects that usually do not require medical attention (report to your doctor or  health care professional if they continue or are bothersome): -fluid build-up -leg cramps -stomach pain -sweating This list may not describe all possible side effects. Call your doctor for medical advice about side effects. You may report side effects to FDA at  1-800-FDA-1088. Where should I keep my medicine? Keep out of the reach of children. Store at room temperature between 15 and 30 degrees C (59 and 86 degrees F). Throw away any unused medicine after the expiration date. NOTE: This sheet is a summary. It may not cover all possible information. If you have questions about this medicine, talk to your doctor, pharmacist, or health care provider.  2012, [employer]vier/[employer] Standard. (10/26/2008 3:15:14 PM)

## 2012-12-31 NOTE — Progress Notes (Signed)
OFFICE PROGRESS NOTE  CC  Gaspar Garbe, MD [address] [facility], [place]. 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  PRIOR THERAPY:  #1 patient presented with a breast mass. She underwent a mastectomy in October 2006 that revealed a left breast cancer stage II A.her final pathology revealed a multifocal ER positive PR negative HER-2/neu negative invasive ductal carcinoma.  #2 she underwent adjuvant chemotherapy consisting of 6 cycles of 5-FU epirubicin and Cytoxan.  #3 she then was begun on letrozole 2.5 mg daily in 2006. however she has been having considerable amount of pain and Dr. Donnie Coffin did suggest the patient go on tamoxifen. However it is uncertain whether she went on this or not.I have discussed the use of raloxifene which I do think would be beneficial to the patient for breast cancer risk reduction as well as her bone health. She is agreeable to this.  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  #5 patient has undergone bilateral salpingo-oophorectomy on 09/04/2013.  CURRENT THERAPY:patient will begin raloxifene 60 mg daily.  INTERVAL HISTORY: [name] 67 y.o. female returns for followup visit to establish her oncologic care with me. I have reviewed her chart completely. Clinically patient seems to be doing well she is without any problems she denies any fevers chills night sweats headaches shortness of breath chest pains or palpitations no myalgias and arthralgias. Remainder of the 10 point review of systems is negative.  MEDICAL HISTORY: Past Medical History  Diagnosis Date  . Osteopenia   . Atrophic vaginitis   . Hypertension   . 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 Glaucoma   . Endometrial mass   . History of cystitis   . Nocturia   . Hyperlipidemia   . BRCA2 positive 07/01/2011    ALLERGIES:   has no known allergies.  MEDICATIONS:  Current Outpatient Prescriptions  Medication Sig Dispense Refill  . atorvastatin (LIPITOR) 10 MG tablet Take 10 mg by mouth every morning.       . Brimonidine Tartrate (ALPHAGAN P OP) Apply to eye 3 (three) times daily.       . Calcium Carbonate-Vit D-Min (CALTRATE 600+D PLUS PO) Take 1 tablet by mouth 2 (two) times daily.       . dorzolamide-timolol (COSOPT) 22.3-6.8 MG/ML ophthalmic solution Place 1 drop into both eyes 2 (two) times daily.       Marland Kitchen latanoprost (XALATAN) 0.005 % ophthalmic solution Place 1 drop into both eyes at bedtime.       Marland Kitchen losartan-hydrochlorothiazide (HYZAAR) 50-12.5 MG per tablet Take 1 tablet by mouth every morning.       . zolpidem (AMBIEN) 5 MG tablet Take 5 mg by mouth at bedtime as needed.       . Cholecalciferol (VITAMIN D) 1000 UNITS capsule Take 1,000 Units by mouth daily.         SURGICAL HISTORY:  Past Surgical History  Procedure Date  . Ectopic pregnancy surgery 73 & 74  . Porta cath   . Hand surgery   . Dx laparoscopy 9.6.12    resulted into open salpingo-opphorectomy   . Bilateral salpingoophorectomy 9.6.12    Dr.Fontaine assisted  .  Pelvic laparoscopy   . Hysteroscopy   . Oophorectomy     BSO  . Transthoracic echocardiogram 10-14-2005    LVSF NORMAL/ EF 55-65%/ MILD MITRAL REGURG  . Dilation and curettage of uterus 1968  . Eye surgery 2001    RIGHT EYE FOR GLAUCOMA  . Carpal tunnel release 2007    RIGHT  . Breast surgery 08-29-2005    TOTAL LEFT MASTECTOMY W/ NODE DISSECTION X4  . Hysteroscopy w/d&c 11/05/2012    Procedure: DILATATION AND CURETTAGE /HYSTEROSCOPY;  Surgeon: Trellis Paganini, MD;  Location: [facility];  Service: Gynecology;  Laterality: N/A;    REVIEW OF SYSTEMS:  Pertinent items are  noted in HPI.   HEALTH MAINTENANCE:   PHYSICAL EXAMINATION: Blood pressure 110/72, pulse 74, temperature 98.1 F (36.7 C), resp. rate 20, height 5\' 5"  (1.651 m), weight 142 lb (64.411 kg). Body mass index is 23.63 kg/(m^2). ECOG PERFORMANCE STATUS: 0 - Asymptomatic   General appearance: alert, cooperative and appears stated age Lymph nodes: Cervical, supraclavicular, and axillary nodes normal. Back: symmetric, no curvature. ROM normal. No CVA tenderness. Cardio: regular rate and rhythm GI: soft, non-tender; bowel sounds normal; no masses,  no organomegaly Extremities: extremities normal, atraumatic, no cyanosis or edema Neurologic: Grossly normal  Left breast no masses nipple discharge or bleeding no nodularity. Left MRM chest wall exam is unremarkable.  LABORATORY DATA: Lab Results  Component Value Date   WBC 6.9 12/31/2012   HGB 12.4 12/31/2012   HCT 36.8 12/31/2012   MCV 90.5 12/31/2012   PLT 182 12/31/2012      Chemistry      Component Value Date/Time   NA 142 12/31/2012 1441   NA 141 11/03/2012 1407   K 3.4* 12/31/2012 1441   K 3.5 11/03/2012 1407   CL 107 12/31/2012 1441   CL 105 11/03/2012 1407   CO2 26 12/31/2012 1441   CO2 27 11/03/2012 1407   BUN 11.3 12/31/2012 1441   BUN 13 11/03/2012 1407   CREATININE 0.8 12/31/2012 1441   CREATININE 0.79 11/03/2012 1407      Component Value Date/Time   CALCIUM 9.5 12/31/2012 1441   CALCIUM 9.6 11/03/2012 1407   ALKPHOS 57 12/31/2012 1441   ALKPHOS 57 02/12/2012 1121   AST 17 12/31/2012 1441   AST 24 02/12/2012 1121   ALT 19 12/31/2012 1441   ALT 28 02/12/2012 1121   BILITOT 0.46 12/31/2012 1441   BILITOT 0.6 02/12/2012 1121       RADIOGRAPHIC STUDIES:  No results found.  ASSESSMENT: 67 year old female with  #1 history of multifocal invasive ductal carcinoma of the left breast status post mastectomy in October 2006. She underwent chemotherapy with 6 cycles of FEC followed by adjuvant antiestrogen therapy with letrozole 2.5 mg daily.  Clinically she has no evidence of recurrent disease. She has been on letrozole now for him most 8 years. She and I discussed coming off of this and possibly starting raloxifene. She does have some osteopenia and this is concerning to keep her on letrozole. Patient understands her route rationale for Evista. Certainly it would be good for her bones as well as in prevention of another breast cancer since she is BRCA 2 positive.  #2 patient will need MRIs for surveillance.   PLAN:   #1 patient will begin Evista 60 mg daily.  #2 I will plan on seeing her back in 3 months time for followup.   All questions were answered. The patient knows to call the  clinic with any problems, questions or concerns. We can certainly see the patient much sooner if necessary.  I spent 40 minutes counseling the patient face to face. The total time spent in the appointment was 40 minutes.    Drue Second, MD Medical/Oncology [facility] [phone] (beeper) [phone] (Office)  12/31/2012, 3:49 PM

## 2013-02-25 ENCOUNTER — Encounter: Payer: Self-pay | Admitting: Gynecology

## 2013-02-25 ENCOUNTER — Ambulatory Visit (INDEPENDENT_AMBULATORY_CARE_PROVIDER_SITE_OTHER): Payer: Medicare Other | Admitting: Gynecology

## 2013-02-25 DIAGNOSIS — M549 Dorsalgia, unspecified: Secondary | ICD-10-CM | POA: Diagnosis not present

## 2013-02-25 DIAGNOSIS — N952 Postmenopausal atrophic vaginitis: Secondary | ICD-10-CM

## 2013-02-25 DIAGNOSIS — N899 Noninflammatory disorder of vagina, unspecified: Secondary | ICD-10-CM | POA: Diagnosis not present

## 2013-02-25 DIAGNOSIS — N760 Acute vaginitis: Secondary | ICD-10-CM | POA: Diagnosis not present

## 2013-02-25 DIAGNOSIS — N898 Other specified noninflammatory disorders of vagina: Secondary | ICD-10-CM

## 2013-02-25 DIAGNOSIS — A499 Bacterial infection, unspecified: Secondary | ICD-10-CM

## 2013-02-25 LAB — WET PREP FOR TRICH, YEAST, CLUE
Trich, Wet Prep: NONE SEEN
Yeast Wet Prep HPF POC: NONE SEEN

## 2013-02-25 LAB — URINALYSIS W MICROSCOPIC + REFLEX CULTURE
Bilirubin Urine: NEGATIVE
Protein, ur: NEGATIVE mg/dL
Urobilinogen, UA: 0.2 mg/dL (ref 0.0–1.0)

## 2013-02-25 MED ORDER — CLINDAMYCIN PHOSPHATE 2 % VA CREA: 1.0000 | TOPICAL_CREAM | Freq: Every day | VAGINAL | Status: DC

## 2013-02-25 NOTE — Patient Instructions (Signed)
Use vaginal cream nightly for 7 days. Followup if irritation continues.

## 2013-02-25 NOTE — Progress Notes (Signed)
Patient presents complaining of vaginal irritation. Also notes some low back pain. She has a chronic issue with vaginal dryness and dyspareunia. Has tried a variety of over-the-counter lubricants and moisturizers without success. She feels though most recently she is more irritated with a little bit of vaginal discharge. Also noticing onset of some low back pain recently. No frequency dysuria urgency fever chills nausea vomiting or other constitutional symptoms. Patient does have a history of stage II receptor positive breast cancer. She is status post BSO.    Exam with Selena Batten assistant Spine straight without CVA tenderness. Abdomen soft nontender without masses guarding rebound organomegaly. External BUS vagina with atrophic changes. Slight white discharge noted. Cervix normal with atrophic changes. Uterus grossly normal Mobile nontender. Adnexa without masses or tenderness.  Assessment and plan: 1. Symptoms, exam and wet prep consistent with bacterial vaginosis. We'll treat with Cleocin vaginal cream nightly x7 days. We'll also check baseline urinalysis. 2. Atrophic vaginitis. Difficult situation in patient with stage II breast cancer. Optimum treatment would be vaginal estrogen but issues of absorption and her positive receptor status complicate the situation. At this point recommended vegetable oil lubrication with intercourse to see if this doesn't help as water based products have not. She'll discuss with her oncologist the issue of safety of vaginal estrogen but at this point I am reluctant to consider this with her history and she agrees with this.

## 2013-02-26 LAB — URINE CULTURE
Colony Count: NO GROWTH
Organism ID, Bacteria: NO GROWTH

## 2013-03-08 ENCOUNTER — Telehealth: Payer: Self-pay | Admitting: *Deleted

## 2013-03-08 MED ORDER — METRONIDAZOLE 500 MG PO TABS: 500.0000 mg | ORAL_TABLET | Freq: Two times a day (BID) | ORAL | Status: DC

## 2013-03-08 NOTE — Telephone Encounter (Signed)
Pt was seen on 02/25/13 Rx given for Cleocin vaginal cream nightly x7 days, she took Rx, but symptoms have returned with low back discomfort and vaginal irritation again. No discharge or odor. Please advise

## 2013-03-08 NOTE — Telephone Encounter (Signed)
Flagyl 500 mg twice a day x7 days, alcohol avoidance. Office visit if symptoms persist or recur.

## 2013-03-08 NOTE — Telephone Encounter (Signed)
Pt informed with the below note. 

## 2013-03-10 ENCOUNTER — Telehealth: Payer: Self-pay | Admitting: *Deleted

## 2013-03-10 MED ORDER — METRONIDAZOLE 0.75 % VA GEL: 1.0000 | Freq: Two times a day (BID) | VAGINAL | Status: DC

## 2013-03-10 NOTE — Telephone Encounter (Signed)
Pt was given rx for flagyl 500 mg x7 days, states that she noticed her stomach area is very sore and back area as well. Pt said that it feels like a aching discomfort, no vomiting or other symptoms. Pt said took 1 pill this am and said she is not going take this evening, she is drinking plenty of water. Please advise

## 2013-03-10 NOTE — Telephone Encounter (Signed)
We can switch her over and use MetroGel twice daily for 5 days to avoid GI upset if she would like.

## 2013-03-10 NOTE — Telephone Encounter (Signed)
Pt said she will try the Metrogel as directed, rx sent.

## 2013-03-18 DIAGNOSIS — IMO0002 Reserved for concepts with insufficient information to code with codable children: Secondary | ICD-10-CM | POA: Diagnosis not present

## 2013-03-18 DIAGNOSIS — I10 Essential (primary) hypertension: Secondary | ICD-10-CM | POA: Diagnosis not present

## 2013-03-18 DIAGNOSIS — C50919 Malignant neoplasm of unspecified site of unspecified female breast: Secondary | ICD-10-CM | POA: Diagnosis not present

## 2013-03-18 DIAGNOSIS — E785 Hyperlipidemia, unspecified: Secondary | ICD-10-CM | POA: Diagnosis not present

## 2013-03-18 DIAGNOSIS — Z79899 Other long term (current) drug therapy: Secondary | ICD-10-CM | POA: Diagnosis not present

## 2013-03-18 DIAGNOSIS — H409 Unspecified glaucoma: Secondary | ICD-10-CM | POA: Diagnosis not present

## 2013-03-18 DIAGNOSIS — G4700 Insomnia, unspecified: Secondary | ICD-10-CM | POA: Diagnosis not present

## 2013-04-04 DIAGNOSIS — H4011X0 Primary open-angle glaucoma, stage unspecified: Secondary | ICD-10-CM | POA: Diagnosis not present

## 2013-04-04 DIAGNOSIS — H409 Unspecified glaucoma: Secondary | ICD-10-CM | POA: Diagnosis not present

## 2013-04-08 ENCOUNTER — Ambulatory Visit (HOSPITAL_BASED_OUTPATIENT_CLINIC_OR_DEPARTMENT_OTHER): Payer: Medicare Other | Admitting: Oncology

## 2013-04-08 ENCOUNTER — Telehealth: Payer: Self-pay | Admitting: Oncology

## 2013-04-08 ENCOUNTER — Other Ambulatory Visit (HOSPITAL_BASED_OUTPATIENT_CLINIC_OR_DEPARTMENT_OTHER): Payer: Medicare Other | Admitting: Lab

## 2013-04-08 ENCOUNTER — Encounter: Payer: Self-pay | Admitting: Oncology

## 2013-04-08 VITALS — BP 124/75 | HR 74 | Temp 98.0°F | Resp 20 | Ht 65.0 in | Wt 143.7 lb

## 2013-04-08 DIAGNOSIS — E559 Vitamin D deficiency, unspecified: Secondary | ICD-10-CM | POA: Diagnosis not present

## 2013-04-08 DIAGNOSIS — Z1501 Genetic susceptibility to malignant neoplasm of breast: Secondary | ICD-10-CM

## 2013-04-08 DIAGNOSIS — Z853 Personal history of malignant neoplasm of breast: Secondary | ICD-10-CM

## 2013-04-08 LAB — CBC WITH DIFFERENTIAL/PLATELET
BASO%: 0.3 % (ref 0.0–2.0)
EOS%: 1.5 % (ref 0.0–7.0)
HCT: 36.5 % (ref 34.8–46.6)
MCH: 29.4 pg (ref 25.1–34.0)
MCHC: 32.5 g/dL (ref 31.5–36.0)
MONO#: 0.6 10*3/uL (ref 0.1–0.9)
NEUT%: 60.1 % (ref 38.4–76.8)
RBC: 4.04 10*6/uL (ref 3.70–5.45)
RDW: 14.6 % — ABNORMAL HIGH (ref 11.2–14.5)
WBC: 7.3 10*3/uL (ref 3.9–10.3)
lymph#: 2.2 10*3/uL (ref 0.9–3.3)

## 2013-04-08 LAB — COMPREHENSIVE METABOLIC PANEL (CC13)
ALT: 19 U/L (ref 0–55)
AST: 18 U/L (ref 5–34)
Albumin: 3.6 g/dL (ref 3.5–5.0)
Alkaline Phosphatase: 50 U/L (ref 40–150)
Calcium: 8.9 mg/dL (ref 8.4–10.4)
Chloride: 110 mEq/L — ABNORMAL HIGH (ref 98–107)
Potassium: 3.7 mEq/L (ref 3.5–5.1)
Sodium: 144 mEq/L (ref 136–145)
Total Protein: 7.2 g/dL (ref 6.4–8.3)

## 2013-04-08 NOTE — Patient Instructions (Addendum)
Discontinue evista  MRI in November  I will see you back in December

## 2013-04-09 LAB — VITAMIN D 25 HYDROXY (VIT D DEFICIENCY, FRACTURES): Vit D, 25-Hydroxy: 58 ng/mL (ref 30–89)

## 2013-04-11 NOTE — Progress Notes (Signed)
OFFICE PROGRESS NOTE  CC  Gaspar Garbe, MD [address] [facility], [place]. 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  PRIOR THERAPY:  #1 patient presented with a breast mass. She underwent a mastectomy in October 2006 that revealed a left breast cancer stage II A.her final pathology revealed a multifocal ER positive PR negative HER-2/neu negative invasive ductal carcinoma.  #2 she underwent adjuvant chemotherapy consisting of 6 cycles of 5-FU epirubicin and Cytoxan.  #3 she then was begun on letrozole 2.5 mg daily in 2006. however she has been having considerable amount of pain and Dr. Donnie Coffin did suggest the patient go on tamoxifen. However it is uncertain whether she went on this or not.I have discussed the use of raloxifene which I do think would be beneficial to the patient for breast cancer risk reduction as well as her bone health. She is agreeable to this.she would like to now discontinue the droloxifene due to side effects.  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  #5 patient has undergone bilateral salpingo-oophorectomy on 09/04/2013.  CURRENT THERAPY:observation  INTERVAL HISTORY: [name] 67 y.o. female returns for followup visitClinically patient seems to be doing well she is without any problems she denies any fevers chills night sweats headaches shortness of breath chest pains or palpitations. Patient is having aches and pains she also thinks that she is having some weight gain. And fatigue. Remainder of the 10 point review of systems is negative.  MEDICAL HISTORY: Past Medical History  Diagnosis Date  . Osteopenia   . Atrophic vaginitis   . Hypertension   . Hx Breast cancer, IDC, Left, multifocal, Stage II ER+, PR - Her 2- DX 09/04/2005-  S/P TOTAL LEFT MASTECTOMY WITH NODE  DISSECTION   AND CHEMO    ONCOLOGIST-  DR RUBIN--  NO RECURRENCE  . Glaucoma   . Endometrial mass   . History of cystitis   . Nocturia   . Hyperlipidemia   . BRCA2 positive 07/01/2011    ALLERGIES:  has No Known Allergies.  MEDICATIONS:  Current Outpatient Prescriptions  Medication Sig Dispense Refill  . atorvastatin (LIPITOR) 10 MG tablet Take 10 mg by mouth every morning.       . Brimonidine Tartrate (ALPHAGAN P OP) Apply to eye 3 (three) times daily.       . Calcium Carbonate-Vit D-Min (CALTRATE 600+D PLUS PO) Take 1 tablet by mouth 2 (two) times daily.       . Cholecalciferol (VITAMIN D) 1000 UNITS capsule Take 1,000 Units by mouth daily.       . dorzolamide-timolol (COSOPT) 22.3-6.8 MG/ML ophthalmic solution Place 1 drop into both eyes 2 (two) times daily.       Marland Kitchen latanoprost (XALATAN) 0.005 % ophthalmic solution Place 1 drop into both eyes at bedtime.       Marland Kitchen losartan-hydrochlorothiazide (HYZAAR) 50-12.5 MG per tablet Take 1 tablet by mouth every morning.       . zolpidem (AMBIEN) 5 MG tablet Take 5 mg by mouth at bedtime as needed.       . raloxifene (EVISTA) 60 MG tablet Take 60 mg by mouth daily. Stopped April 2014       No current facility-administered medications for this visit.    SURGICAL HISTORY:  Past Surgical History  Procedure Laterality Date  . Ectopic pregnancy surgery  73 & 74  . Porta cath    . Hand surgery    . Dx laparoscopy  9.6.12    resulted into open salpingo-opphorectomy   . Bilateral salpingoophorectomy  9.6.12    Dr.Fontaine assisted  . Pelvic laparoscopy    . Hysteroscopy    . Oophorectomy      BSO  . Transthoracic echocardiogram  10-14-2005    LVSF NORMAL/ EF 55-65%/ MILD MITRAL REGURG  . Dilation and curettage of uterus  1968  . Eye surgery  2001    RIGHT EYE FOR GLAUCOMA  . Carpal tunnel release  2007    RIGHT  . Breast surgery  08-29-2005    TOTAL LEFT MASTECTOMY W/ NODE DISSECTION X4  . Hysteroscopy w/d&c  11/05/2012    Procedure:  DILATATION AND CURETTAGE /HYSTEROSCOPY;  Surgeon: Trellis Paganini, MD;  Location: [facility];  Service: Gynecology;  Laterality: N/A;    REVIEW OF SYSTEMS:  Pertinent items are noted in HPI.   HEALTH MAINTENANCE:   PHYSICAL EXAMINATION: Blood pressure 124/75, pulse 74, temperature 98 F (36.7 C), temperature source Oral, resp. rate 20, height 5\' 5"  (1.651 m), weight 143 lb 11.2 oz (65.182 kg). Body mass index is 23.91 kg/(m^2). ECOG PERFORMANCE STATUS: 0 - Asymptomatic   General appearance: alert, cooperative and appears stated age Lymph nodes: Cervical, supraclavicular, and axillary nodes normal. Back: symmetric, no curvature. ROM normal. No CVA tenderness. Cardio: regular rate and rhythm GI: soft, non-tender; bowel sounds normal; no masses,  no organomegaly Extremities: extremities normal, atraumatic, no cyanosis or edema Neurologic: Grossly normal  Left breast no masses nipple discharge or bleeding no nodularity. Left MRM chest wall exam is unremarkable.  LABORATORY DATA: Lab Results  Component Value Date   WBC 7.3 04/08/2013   HGB 11.9 04/08/2013   HCT 36.5 04/08/2013   MCV 90.3 04/08/2013   PLT 192 04/08/2013      Chemistry      Component Value Date/Time   NA 144 04/08/2013 1112   NA 141 11/03/2012 1407   K 3.7 04/08/2013 1112   K 3.5 11/03/2012 1407   CL 110* 04/08/2013 1112   CL 105 11/03/2012 1407   CO2 26 04/08/2013 1112   CO2 27 11/03/2012 1407   BUN 14.5 04/08/2013 1112   BUN 13 11/03/2012 1407   CREATININE 0.7 04/08/2013 1112   CREATININE 0.79 11/03/2012 1407      Component Value Date/Time   CALCIUM 8.9 04/08/2013 1112   CALCIUM 9.6 11/03/2012 1407   ALKPHOS 50 04/08/2013 1112   ALKPHOS 57 02/12/2012 1121   AST 18 04/08/2013 1112   AST 24 02/12/2012 1121   ALT 19 04/08/2013 1112   ALT 28 02/12/2012 1121   BILITOT 0.69 04/08/2013 1112   BILITOT 0.6 02/12/2012 1121       RADIOGRAPHIC STUDIES:  No results found.  ASSESSMENT: 67 year old  female with  #1 history of multifocal invasive ductal carcinoma of the left breast status post mastectomy in October 2006. She underwent chemotherapy with 6 cycles of FEC followed by adjuvant antiestrogen therapy with letrozole 2.5 mg daily. Clinically she has no evidence of recurrent disease. She has been on letrozole now for him most 8 years. She and I discussed coming off of this and possibly starting raloxifene. She does have some osteopenia and this is concerning to keep her on letrozole. Patient understands her route rationale for Evista. Certainly it would be good for her bones as well as in prevention of another  breast cancer since she is BRCA 2 positive.  #2 patient will need MRIs for surveillance.   PLAN:   #1 discontinue Evista  #2 she will have MRI in November 2014  #3 I will see her back in December 2014  All questions were answered. The patient knows to call the clinic with any problems, questions or concerns. We can certainly see the patient much sooner if necessary.  I spent 25 minutes counseling the patient face to face. The total time spent in the appointment was 30 minutes.    Drue Second, MD Medical/Oncology [facility] [phone] (beeper) [phone] (Office)

## 2013-05-02 DIAGNOSIS — H409 Unspecified glaucoma: Secondary | ICD-10-CM | POA: Diagnosis not present

## 2013-05-02 DIAGNOSIS — H4011X0 Primary open-angle glaucoma, stage unspecified: Secondary | ICD-10-CM | POA: Diagnosis not present

## 2013-05-24 DIAGNOSIS — E785 Hyperlipidemia, unspecified: Secondary | ICD-10-CM | POA: Diagnosis not present

## 2013-05-24 DIAGNOSIS — M2550 Pain in unspecified joint: Secondary | ICD-10-CM | POA: Diagnosis not present

## 2013-06-08 ENCOUNTER — Other Ambulatory Visit: Payer: Self-pay | Admitting: Oncology

## 2013-06-08 DIAGNOSIS — Z853 Personal history of malignant neoplasm of breast: Secondary | ICD-10-CM

## 2013-06-21 ENCOUNTER — Telehealth: Payer: Self-pay | Admitting: Oncology

## 2013-06-27 DIAGNOSIS — H2510 Age-related nuclear cataract, unspecified eye: Secondary | ICD-10-CM | POA: Diagnosis not present

## 2013-06-27 DIAGNOSIS — H409 Unspecified glaucoma: Secondary | ICD-10-CM | POA: Diagnosis not present

## 2013-06-27 DIAGNOSIS — H4011X0 Primary open-angle glaucoma, stage unspecified: Secondary | ICD-10-CM | POA: Diagnosis not present

## 2013-06-29 DIAGNOSIS — Z09 Encounter for follow-up examination after completed treatment for conditions other than malignant neoplasm: Secondary | ICD-10-CM | POA: Diagnosis not present

## 2013-06-29 DIAGNOSIS — Z86010 Personal history of colonic polyps: Secondary | ICD-10-CM | POA: Diagnosis not present

## 2013-07-27 ENCOUNTER — Encounter: Payer: Self-pay | Admitting: Gynecology

## 2013-07-27 ENCOUNTER — Ambulatory Visit (INDEPENDENT_AMBULATORY_CARE_PROVIDER_SITE_OTHER): Payer: Medicare Other | Admitting: Gynecology

## 2013-07-27 ENCOUNTER — Other Ambulatory Visit (HOSPITAL_COMMUNITY)
Admission: RE | Admit: 2013-07-27 | Discharge: 2013-07-27 | Disposition: A | Discharge status: Home / Self Care | Payer: Medicare Other | Source: Ambulatory Visit | Attending: Gynecology | Admitting: Gynecology

## 2013-07-27 VITALS — BP 120/76 | Ht 64.25 in | Wt 138.0 lb

## 2013-07-27 DIAGNOSIS — Z124 Encounter for screening for malignant neoplasm of cervix: Secondary | ICD-10-CM

## 2013-07-27 DIAGNOSIS — N952 Postmenopausal atrophic vaginitis: Secondary | ICD-10-CM | POA: Diagnosis not present

## 2013-07-27 DIAGNOSIS — M8580 Other specified disorders of bone density and structure, unspecified site: Secondary | ICD-10-CM

## 2013-07-27 DIAGNOSIS — M899 Disorder of bone, unspecified: Secondary | ICD-10-CM

## 2013-07-27 DIAGNOSIS — N898 Other specified noninflammatory disorders of vagina: Secondary | ICD-10-CM | POA: Diagnosis not present

## 2013-07-27 LAB — WET PREP FOR TRICH, YEAST, CLUE: Yeast Wet Prep HPF POC: NONE SEEN

## 2013-07-27 NOTE — Addendum Note (Signed)
Addended by: Richardson Chiquito on: 07/27/2013 02:15 PM   Modules accepted: Orders

## 2013-07-27 NOTE — Patient Instructions (Signed)
Follow up in one year for annual exam 

## 2013-07-27 NOTE — Progress Notes (Signed)
[name] Feb 29, 1946 [id number]        67 y.o.  G2P0020 for followup exam.  Patient of Dr. Eda Paschal with several issues noted below.  Past medical history,surgical history, medications, allergies, family history and social history were all reviewed and documented in the EPIC chart.  ROS:  Performed and pertinent positives and negatives are included in the history, assessment and plan .  Exam: Sherrilyn Rist assistant Filed Vitals:   07/27/13 1038  BP: 120/76  Height: 5' 4.25" (1.632 m)  Weight: 138 lb (62.596 kg)   General appearance  Normal Skin grossly normal Head/Neck normal with no cervical or supraclavicular adenopathy thyroid normal Lungs  clear Cardiac RR, without RMG Abdominal  soft, nontender, without masses, organomegaly or hernia Breast/chest wall  examined lying and sitting. Right breast without masses, retractions, discharge or axillary adenopathy. Left chest wall without masses or axillary adenopathy, well healed mastectomy scar Pelvic  Ext/BUS/vagina  atrophic changes with thick white discharge  Cervix  normal with atrophic changes  Uterus  axial, normal size, shape and contour, midline and mobile nontender   Adnexa  Without masses or tenderness    Anus and perineum  normal   Rectovaginal  normal sphincter tone without palpated masses or tenderness.    Assessment/Plan:  67 y.o. G56P0020 female for followup exam.   1. 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  Status post left mastectomy. Actively being followed by her oncologist. I reviewed her significant increased risk of right breast cancer and options for prophylactic mastectomy. She is now being followed with MRI and mammography per her oncologist and I have asked her to discuss possible prophylactic mastectomy with them as she seems to be interested in this. The patient had a left salpingo-oophorectomy by Dr. Eda Paschal and at the time of surgery the right tube and ovary were unable to be found and it was  felt that it was probably removed during one of her ectopic surgeries. 2. Vaginal discharge. Noted at the time of exam. Patient in retrospect notes some pruritus. Wet prep is unremarkable. We'll cover for yeast with Diflucan 150 mg x1 dose. 3. Postmenopausal/atrophic vaginal changes. Patient does have some vaginal dryness I recommended a trial of OTC moisturizers and she will go ahead and try this. He'll followup with me if he continues to be an issue. 4. Osteopenia. DEXA 2013 with T score -1.6 FRAX 4.3%/0.6%. Increase calcium vitamin D reviewed with planned repeat DEXA at two-year interval. 5. Pap smear 2011. Pap smear was done today. Options to stop screening altogether she is over the age of 25 versus less frequent screening intervals discussed. We'll readdress on an annual basis. 6. Colonoscopy 06/2013. Repeat at their recommended interval. 7. Health maintenance. No blood work done as it is reportedly done through her other physician's offices. Followup one year, sooner as needed.   Note: This document was prepared with digital dictation and possible smart phrase technology. Any transcriptional errors that result from this process are unintentional.   Dara Lords MD, 11:21 AM 07/27/2013

## 2013-07-28 ENCOUNTER — Telehealth: Payer: Self-pay | Admitting: *Deleted

## 2013-07-28 MED ORDER — FLUCONAZOLE 150 MG PO TABS: 150.0000 mg | ORAL_TABLET | Freq: Once | ORAL | Status: DC

## 2013-07-28 NOTE — Telephone Encounter (Signed)
Pt never received her diflucan RX from pharmacy, Rx was never sent. I will sent Diflucan 150 x 1 per 07/27/13 note

## 2013-08-12 DIAGNOSIS — Z1231 Encounter for screening mammogram for malignant neoplasm of breast: Secondary | ICD-10-CM | POA: Diagnosis not present

## 2013-08-16 DIAGNOSIS — Z23 Encounter for immunization: Secondary | ICD-10-CM | POA: Diagnosis not present

## 2013-08-16 DIAGNOSIS — M2550 Pain in unspecified joint: Secondary | ICD-10-CM | POA: Diagnosis not present

## 2013-08-16 DIAGNOSIS — IMO0002 Reserved for concepts with insufficient information to code with codable children: Secondary | ICD-10-CM | POA: Diagnosis not present

## 2013-08-16 DIAGNOSIS — E785 Hyperlipidemia, unspecified: Secondary | ICD-10-CM | POA: Diagnosis not present

## 2013-08-17 ENCOUNTER — Encounter: Payer: Self-pay | Admitting: Gynecology

## 2013-08-19 ENCOUNTER — Telehealth: Payer: Self-pay | Admitting: *Deleted

## 2013-08-19 MED ORDER — CLINDAMYCIN PHOSPHATE 2 % VA CREA: TOPICAL_CREAM | VAGINAL | Status: DC

## 2013-08-19 NOTE — Telephone Encounter (Signed)
Pt informed, rx sent 

## 2013-08-19 NOTE — Telephone Encounter (Signed)
Cleocin vaginal cream nightly x7 days, office visit if symptoms continue

## 2013-08-19 NOTE — Telephone Encounter (Signed)
Pt called c/o possible BV infection requesting Rx. I explained to pt that OV best for any infection, pt c/o vaginal burning, no odor. Pt declined stating she couldn't come until next week. Please advise

## 2013-09-16 DIAGNOSIS — I10 Essential (primary) hypertension: Secondary | ICD-10-CM | POA: Diagnosis not present

## 2013-09-16 DIAGNOSIS — E785 Hyperlipidemia, unspecified: Secondary | ICD-10-CM | POA: Diagnosis not present

## 2013-09-16 DIAGNOSIS — Z79899 Other long term (current) drug therapy: Secondary | ICD-10-CM | POA: Diagnosis not present

## 2013-09-23 DIAGNOSIS — C50919 Malignant neoplasm of unspecified site of unspecified female breast: Secondary | ICD-10-CM | POA: Diagnosis not present

## 2013-09-23 DIAGNOSIS — E785 Hyperlipidemia, unspecified: Secondary | ICD-10-CM | POA: Diagnosis not present

## 2013-09-23 DIAGNOSIS — H409 Unspecified glaucoma: Secondary | ICD-10-CM | POA: Diagnosis not present

## 2013-09-23 DIAGNOSIS — Z79899 Other long term (current) drug therapy: Secondary | ICD-10-CM | POA: Diagnosis not present

## 2013-09-23 DIAGNOSIS — Z0000 Encounter for general adult medical examination without abnormal findings: Secondary | ICD-10-CM | POA: Diagnosis not present

## 2013-09-23 DIAGNOSIS — M2550 Pain in unspecified joint: Secondary | ICD-10-CM | POA: Diagnosis not present

## 2013-09-23 DIAGNOSIS — G4700 Insomnia, unspecified: Secondary | ICD-10-CM | POA: Diagnosis not present

## 2013-09-23 DIAGNOSIS — I10 Essential (primary) hypertension: Secondary | ICD-10-CM | POA: Diagnosis not present

## 2013-09-28 DIAGNOSIS — H409 Unspecified glaucoma: Secondary | ICD-10-CM | POA: Diagnosis not present

## 2013-09-28 DIAGNOSIS — H4011X0 Primary open-angle glaucoma, stage unspecified: Secondary | ICD-10-CM | POA: Diagnosis not present

## 2013-09-28 DIAGNOSIS — H02409 Unspecified ptosis of unspecified eyelid: Secondary | ICD-10-CM | POA: Diagnosis not present

## 2013-10-03 ENCOUNTER — Other Ambulatory Visit: Payer: Medicare Other

## 2013-10-06 DIAGNOSIS — Z1212 Encounter for screening for malignant neoplasm of rectum: Secondary | ICD-10-CM | POA: Diagnosis not present

## 2013-10-09 ENCOUNTER — Ambulatory Visit
Admission: RE | Admit: 2013-10-09 | Discharge: 2013-10-09 | Disposition: A | Discharge status: Home / Self Care | Payer: Medicare Other | Source: Ambulatory Visit | Attending: Oncology | Admitting: Oncology

## 2013-10-09 DIAGNOSIS — Z853 Personal history of malignant neoplasm of breast: Secondary | ICD-10-CM

## 2013-10-09 DIAGNOSIS — Z1501 Genetic susceptibility to malignant neoplasm of breast: Secondary | ICD-10-CM

## 2013-10-09 MED ORDER — GADOBENATE DIMEGLUMINE 529 MG/ML IV SOLN
14.0000 mL | Freq: Once | INTRAVENOUS | Status: AC | PRN
  Administered 2013-10-09: 14 mL via INTRAVENOUS

## 2013-10-26 ENCOUNTER — Ambulatory Visit (HOSPITAL_BASED_OUTPATIENT_CLINIC_OR_DEPARTMENT_OTHER): Payer: Medicare Other | Admitting: Oncology

## 2013-10-26 ENCOUNTER — Other Ambulatory Visit (HOSPITAL_BASED_OUTPATIENT_CLINIC_OR_DEPARTMENT_OTHER): Payer: Medicare Other | Admitting: Lab

## 2013-10-26 ENCOUNTER — Telehealth: Payer: Self-pay | Admitting: *Deleted

## 2013-10-26 ENCOUNTER — Encounter: Payer: Self-pay | Admitting: Oncology

## 2013-10-26 ENCOUNTER — Encounter (INDEPENDENT_AMBULATORY_CARE_PROVIDER_SITE_OTHER): Payer: Self-pay

## 2013-10-26 VITALS — BP 158/80 | HR 76 | Temp 97.3°F | Resp 18 | Ht 64.25 in | Wt 137.5 lb

## 2013-10-26 DIAGNOSIS — Z1501 Genetic susceptibility to malignant neoplasm of breast: Secondary | ICD-10-CM

## 2013-10-26 DIAGNOSIS — C50919 Malignant neoplasm of unspecified site of unspecified female breast: Secondary | ICD-10-CM | POA: Diagnosis not present

## 2013-10-26 DIAGNOSIS — M899 Disorder of bone, unspecified: Secondary | ICD-10-CM

## 2013-10-26 DIAGNOSIS — R5381 Other malaise: Secondary | ICD-10-CM | POA: Diagnosis not present

## 2013-10-26 DIAGNOSIS — Z853 Personal history of malignant neoplasm of breast: Secondary | ICD-10-CM

## 2013-10-26 LAB — CBC WITH DIFFERENTIAL/PLATELET
BASO%: 0.7 % (ref 0.0–2.0)
Basophils Absolute: 0.1 10*3/uL (ref 0.0–0.1)
Eosinophils Absolute: 0.1 10*3/uL (ref 0.0–0.5)
HCT: 40.4 % (ref 34.8–46.6)
HGB: 13.2 g/dL (ref 11.6–15.9)
MCH: 30.1 pg (ref 25.1–34.0)
MCHC: 32.6 g/dL (ref 31.5–36.0)
MONO#: 0.5 10*3/uL (ref 0.1–0.9)
NEUT#: 4.2 10*3/uL (ref 1.5–6.5)
NEUT%: 57.7 % (ref 38.4–76.8)
RBC: 4.37 10*6/uL (ref 3.70–5.45)
RDW: 14.8 % — ABNORMAL HIGH (ref 11.2–14.5)
WBC: 7.3 10*3/uL (ref 3.9–10.3)
lymph#: 2.4 10*3/uL (ref 0.9–3.3)

## 2013-10-26 LAB — COMPREHENSIVE METABOLIC PANEL (CC13)
ALT: 19 U/L (ref 0–55)
Albumin: 4.1 g/dL (ref 3.5–5.0)
Anion Gap: 11 mEq/L (ref 3–11)
BUN: 13 mg/dL (ref 7.0–26.0)
CO2: 24 mEq/L (ref 22–29)
Calcium: 9.7 mg/dL (ref 8.4–10.4)
Chloride: 108 mEq/L (ref 98–109)
Creatinine: 0.8 mg/dL (ref 0.6–1.1)
Glucose: 75 mg/dl (ref 70–140)
Potassium: 4.4 mEq/L (ref 3.5–5.1)
Total Protein: 7.8 g/dL (ref 6.4–8.3)

## 2013-10-26 NOTE — Telephone Encounter (Signed)
appts made and printed...td 

## 2013-10-26 NOTE — Progress Notes (Signed)
OFFICE PROGRESS NOTE  CC  Gaspar Garbe, MD [address] [facility] [place]. 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  PRIOR THERAPY:  #1 patient presented with a breast mass. She underwent a mastectomy in October 2006 that revealed a left breast cancer stage II A.her final pathology revealed a multifocal ER positive PR negative HER-2/neu negative invasive ductal carcinoma.  #2 she underwent adjuvant chemotherapy consisting of 6 cycles of 5-FU epirubicin and Cytoxan.  #3 she then was begun on letrozole 2.5 mg daily in 2006. however she has been having considerable amount of pain and Dr. Donnie Coffin did suggest the patient go on tamoxifen. However it is uncertain whether she went on this or not.I have discussed the use of raloxifene which I do think would be beneficial to the patient for breast cancer risk reduction as well as her bone health. She is agreeable to this.she would like to now discontinue the droloxifene due to side effects.  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  #5 patient has undergone bilateral salpingo-oophorectomy on 09/04/2013.  CURRENT THERAPY:observation  INTERVAL HISTORY: OLIA HINDERLITER 67 y.o. female returns for followup visit. Clinically patient seems to be doing well she is without any problems she denies any fevers chills night sweats headaches shortness of breath chest pains or palpitations. Patient is having aches and pains she also thinks that she is having some weight gain. And fatigue. Remainder of the 10 point review of systems is negative.  MEDICAL HISTORY: Past Medical History  Diagnosis Date  . Osteopenia   . Atrophic vaginitis   . Hypertension   . Hx Breast cancer, IDC, Left, multifocal, Stage II ER+, PR - Her 2- DX 09/04/2005-  S/P TOTAL LEFT MASTECTOMY WITH NODE  DISSECTION   AND CHEMO    ONCOLOGIST-  DR RUBIN--  NO RECURRENCE  . Glaucoma   . Endometrial mass   . History of cystitis   . Nocturia   . Hyperlipidemia   . BRCA2 positive 07/01/2011    ALLERGIES:  has No Known Allergies.  MEDICATIONS:  Current Outpatient Prescriptions  Medication Sig Dispense Refill  . Brimonidine Tartrate (ALPHAGAN P OP) Apply to eye 3 (three) times daily.       . Calcium Carbonate-Vit D-Min (CALTRATE 600+D PLUS PO) Take 1 tablet by mouth 2 (two) times daily.       . Cholecalciferol (VITAMIN D) 1000 UNITS capsule Take 1,000 Units by mouth daily.       . dorzolamide-timolol (COSOPT) 22.3-6.8 MG/ML ophthalmic solution Place 1 drop into both eyes 2 (two) times daily.       Marland Kitchen latanoprost (XALATAN) 0.005 % ophthalmic solution Place 1 drop into both eyes at bedtime.       Marland Kitchen losartan-hydrochlorothiazide (HYZAAR) 50-12.5 MG per tablet Take 1 tablet by mouth every morning.       . pravastatin (PRAVACHOL) 40 MG tablet Take 40 mg by mouth daily.      Marland Kitchen zolpidem (AMBIEN) 5 MG tablet Take 5 mg by mouth at bedtime as needed.        No current facility-administered medications for this visit.    SURGICAL HISTORY:  Past Surgical History  Procedure Laterality Date  . Ectopic pregnancy surgery  73 & 74  . Porta cath    . Hand surgery    . Dx laparoscopy  9.6.12  resulted into open salpingo-opphorectomy   . Bilateral salpingoophorectomy  9.6.12    Left side removed. Right tube and ovary never identified  . Transthoracic echocardiogram  10-14-2005    LVSF NORMAL/ EF 55-65%/ MILD MITRAL REGURG  . Dilation and curettage of uterus  1968  . Eye surgery  2001    RIGHT EYE FOR GLAUCOMA  . Carpal tunnel release  2007    RIGHT  . Breast surgery  08-29-2005    TOTAL LEFT MASTECTOMY W/ NODE DISSECTION X4  . Hysteroscopy w/d&c  11/05/2012    Procedure: DILATATION AND CURETTAGE /HYSTEROSCOPY;  Surgeon: Trellis Paganini, MD;  Location: [facility];  Service:  Gynecology;  Laterality: N/A;    REVIEW OF SYSTEMS:  Pertinent items are noted in HPI.   HEALTH MAINTENANCE:   PHYSICAL EXAMINATION: Blood pressure 158/80, pulse 76, temperature 97.3 F (36.3 C), temperature source Oral, resp. rate 18, height 5' 4.25" (1.632 m), weight 137 lb 8 oz (62.37 kg). Body mass index is 23.42 kg/(m^2). ECOG PERFORMANCE STATUS: 0 - Asymptomatic   General appearance: alert, cooperative and appears stated age Lymph nodes: Cervical, supraclavicular, and axillary nodes normal. Back: symmetric, no curvature. ROM normal. No CVA tenderness. Cardio: regular rate and rhythm GI: soft, non-tender; bowel sounds normal; no masses,  no organomegaly Extremities: extremities normal, atraumatic, no cyanosis or edema Neurologic: Grossly normal  Left breast no masses nipple discharge or bleeding no nodularity. Left MRM chest wall exam is unremarkable.  LABORATORY DATA: Lab Results  Component Value Date   WBC 7.3 10/26/2013   HGB 13.2 10/26/2013   HCT 40.4 10/26/2013   MCV 92.4 10/26/2013   PLT 204 10/26/2013      Chemistry      Component Value Date/Time   NA 144 04/08/2013 1112   NA 141 11/03/2012 1407   K 3.7 04/08/2013 1112   K 3.5 11/03/2012 1407   CL 110* 04/08/2013 1112   CL 105 11/03/2012 1407   CO2 26 04/08/2013 1112   CO2 27 11/03/2012 1407   BUN 14.5 04/08/2013 1112   BUN 13 11/03/2012 1407   CREATININE 0.7 04/08/2013 1112   CREATININE 0.79 11/03/2012 1407      Component Value Date/Time   CALCIUM 8.9 04/08/2013 1112   CALCIUM 9.6 11/03/2012 1407   ALKPHOS 50 04/08/2013 1112   ALKPHOS 57 02/12/2012 1121   AST 18 04/08/2013 1112   AST 24 02/12/2012 1121   ALT 19 04/08/2013 1112   ALT 28 02/12/2012 1121   BILITOT 0.69 04/08/2013 1112   BILITOT 0.6 02/12/2012 1121       RADIOGRAPHIC STUDIES:  No results found.  ASSESSMENT: 66 year old female with  #1 history of multifocal invasive ductal carcinoma of the left breast status post mastectomy in October 2006.  She underwent chemotherapy with 6 cycles of FEC followed by adjuvant antiestrogen therapy with letrozole 2.5 mg daily. Clinically she has no evidence of recurrent disease. She has been on letrozole now for him most 8 years. She and I discussed coming off of this and possibly starting raloxifene. She does have some osteopenia and this is concerning to keep her on letrozole. Patient understands her route rationale for Evista. Certainly it would be good for her bones as well as in prevention of another breast cancer since she is BRCA 2 positive.  #2 patient will need MRIs for surveillance on an annual basis   PLAN:  #1 continue observation clinically.  #2 MRI in one year  #3 I  will see the patient back in 6 months for followup  All questions were answered. The patient knows to call the clinic with any problems, questions or concerns. We can certainly see the patient much sooner if necessary.  I spent 15 minutes counseling the patient face to face. The total time spent in the appointment was 30 minutes.    Drue Second, MD Medical/Oncology [facility] [phone] (beeper) [phone] (Office)

## 2013-10-28 ENCOUNTER — Encounter: Payer: Self-pay | Admitting: Gynecology

## 2013-10-28 ENCOUNTER — Ambulatory Visit (INDEPENDENT_AMBULATORY_CARE_PROVIDER_SITE_OTHER): Payer: Medicare Other | Admitting: Gynecology

## 2013-10-28 DIAGNOSIS — M549 Dorsalgia, unspecified: Secondary | ICD-10-CM | POA: Diagnosis not present

## 2013-10-28 DIAGNOSIS — R350 Frequency of micturition: Secondary | ICD-10-CM | POA: Diagnosis not present

## 2013-10-28 DIAGNOSIS — N898 Other specified noninflammatory disorders of vagina: Secondary | ICD-10-CM

## 2013-10-28 LAB — URINALYSIS W MICROSCOPIC + REFLEX CULTURE
Bilirubin Urine: NEGATIVE
Hgb urine dipstick: NEGATIVE
Nitrite: NEGATIVE
Protein, ur: NEGATIVE mg/dL
Specific Gravity, Urine: >1.03 — ABNORMAL HIGH (ref 1.005–1.030)
Urobilinogen, UA: 0.2 mg/dL (ref 0.0–1.0)

## 2013-10-28 LAB — WET PREP FOR TRICH, YEAST, CLUE: Trich, Wet Prep: NONE SEEN

## 2013-10-28 MED ORDER — METRONIDAZOLE 500 MG PO TABS: 500.0000 mg | ORAL_TABLET | Freq: Two times a day (BID) | ORAL | Status: DC

## 2013-10-28 NOTE — Patient Instructions (Signed)
Take oral antibiotic twice daily or 7 days. Avoid alcohol while taking.

## 2013-10-28 NOTE — Progress Notes (Signed)
Patient presents with one-week history of lower back pain and urinary frequency. No dysuria, abdominal pain, nausea vomiting diarrhea constipation.  Exam with Selena Batten assistant Spine straight without CVA tenderness. Abdomen soft nontender without masses guarding rebound organomegaly. Pelvic external BUS vagina with atrophic changes. White discharge noted. Cervix grossly normal. Bimanual uterus normal size midline mobile nontender. Adnexa without masses or tenderness.  Assessment and plan: Urinary frequency with some low back pain. Urinalysis is negative. Does show some trace ketonuria. Discussed increasing oral intake. Wet prep does show clue cells and bacteria. We'll cover for bacterial vaginosis with Flagyl 500 mg twice a day x7 days. Alcohol avoidance reviewed. Followup if symptoms persist, worsen or recur.

## 2013-11-01 ENCOUNTER — Telehealth: Payer: Self-pay | Admitting: *Deleted

## 2013-11-01 MED ORDER — METRONIDAZOLE 0.75 % VA GEL: 1.0000 | Freq: Every day | VAGINAL | Status: DC

## 2013-11-01 NOTE — Telephone Encounter (Signed)
Pt informed with the below note, rx sent. 

## 2013-11-01 NOTE — Telephone Encounter (Signed)
Pt was seen on 10/28/13 given on Flagyl 500 mg twice a day x7 days, pt asked if Metrogel could be given, pt said pill are not working. C/o symptoms  From OV. Please advise

## 2013-11-01 NOTE — Telephone Encounter (Signed)
MetroGel one applicator nightly x7 okay

## 2014-01-03 DIAGNOSIS — H409 Unspecified glaucoma: Secondary | ICD-10-CM | POA: Diagnosis not present

## 2014-01-03 DIAGNOSIS — H21519 Anterior synechiae (iris), unspecified eye: Secondary | ICD-10-CM | POA: Diagnosis not present

## 2014-01-03 DIAGNOSIS — H4011X0 Primary open-angle glaucoma, stage unspecified: Secondary | ICD-10-CM | POA: Diagnosis not present

## 2014-02-20 NOTE — Progress Notes (Signed)
Faxed order to [facility] for bone density on pt.  Sent to scan.

## 2014-03-03 ENCOUNTER — Ambulatory Visit (INDEPENDENT_AMBULATORY_CARE_PROVIDER_SITE_OTHER): Payer: Medicare Other | Admitting: Women's Health

## 2014-03-03 DIAGNOSIS — N949 Unspecified condition associated with female genital organs and menstrual cycle: Secondary | ICD-10-CM

## 2014-03-03 DIAGNOSIS — M549 Dorsalgia, unspecified: Secondary | ICD-10-CM

## 2014-03-03 DIAGNOSIS — N898 Other specified noninflammatory disorders of vagina: Secondary | ICD-10-CM

## 2014-03-03 DIAGNOSIS — M899 Disorder of bone, unspecified: Secondary | ICD-10-CM | POA: Diagnosis not present

## 2014-03-03 LAB — URINALYSIS W MICROSCOPIC + REFLEX CULTURE
Bilirubin Urine: NEGATIVE
Glucose, UA: NEGATIVE mg/dL
HGB URINE DIPSTICK: NEGATIVE
Ketones, ur: NEGATIVE mg/dL
Leukocytes, UA: NEGATIVE
NITRITE: NEGATIVE
Protein, ur: NEGATIVE mg/dL
Urobilinogen, UA: 0.2 mg/dL (ref 0.0–1.0)
pH: 5.5 (ref 5.0–8.0)

## 2014-03-03 LAB — WET PREP FOR TRICH, YEAST, CLUE
Clue Cells Wet Prep HPF POC: NONE SEEN
Trich, Wet Prep: NONE SEEN
YEAST WET PREP: NONE SEEN

## 2014-03-03 MED ORDER — METRONIDAZOLE 0.75 % VA GEL: 1.0000 | Freq: Every day | VAGINAL | Status: DC

## 2014-03-03 NOTE — Progress Notes (Signed)
Patient ID: [name], female   DOB: September 02, 1946, 68 y.o.   MRN: [id number] Presents with complaint of low back ache, vaginal odor and pelvic aching for 1 week. Denies fever. Increase in urinary frequency, nocturia, denies burning with urination. States discomfort is the same as when she had bacteria vaginosis in December. Has better relief with MetroGel than with Flagyl. Postmenopausal on no HRT with no bleeding. 2006 breast cancer.  Exam: Appears well. UA: Negative. External genitalia within normal limits, difficulty placing speculum atrophy, wet prep done with Q-tip, wet prep negative. Bimanual no CMT or adnexal fullness or tenderness. Minimal abdominal discomfort with exam.   Symptomatic bacterial Vaginitis  Plan: Urine culture pending. MetroGel vaginal cream 1 applicator at bedtime for 2 nights, instructed to call if no relief, reviewed may not need to take full 5 days. Call if symptoms persist.

## 2014-03-04 LAB — URINE CULTURE: Colony Count: 1000

## 2014-03-10 ENCOUNTER — Encounter: Payer: Self-pay | Admitting: Gynecology

## 2014-03-17 ENCOUNTER — Telehealth: Payer: Self-pay | Admitting: *Deleted

## 2014-03-17 NOTE — Telephone Encounter (Signed)
Per Mendel Ryder, NP, I informed patient about her bone density results. Per Mendel Ryder, keep taking Calcium, Vitamin D and doing weight bearing exercises.  Patient verbalized understanding.

## 2014-03-24 DIAGNOSIS — Z79899 Other long term (current) drug therapy: Secondary | ICD-10-CM | POA: Diagnosis not present

## 2014-03-24 DIAGNOSIS — I10 Essential (primary) hypertension: Secondary | ICD-10-CM | POA: Diagnosis not present

## 2014-03-24 DIAGNOSIS — M2550 Pain in unspecified joint: Secondary | ICD-10-CM | POA: Diagnosis not present

## 2014-03-24 DIAGNOSIS — M899 Disorder of bone, unspecified: Secondary | ICD-10-CM | POA: Diagnosis not present

## 2014-03-24 DIAGNOSIS — M949 Disorder of cartilage, unspecified: Secondary | ICD-10-CM | POA: Diagnosis not present

## 2014-03-24 DIAGNOSIS — E785 Hyperlipidemia, unspecified: Secondary | ICD-10-CM | POA: Diagnosis not present

## 2014-03-24 DIAGNOSIS — E559 Vitamin D deficiency, unspecified: Secondary | ICD-10-CM | POA: Diagnosis not present

## 2014-03-24 DIAGNOSIS — H409 Unspecified glaucoma: Secondary | ICD-10-CM | POA: Diagnosis not present

## 2014-03-24 DIAGNOSIS — C50919 Malignant neoplasm of unspecified site of unspecified female breast: Secondary | ICD-10-CM | POA: Diagnosis not present

## 2014-04-11 ENCOUNTER — Telehealth: Payer: Self-pay | Admitting: Physician Assistant

## 2014-04-24 ENCOUNTER — Other Ambulatory Visit: Payer: Medicare Other

## 2014-04-24 ENCOUNTER — Ambulatory Visit: Payer: Medicare Other | Admitting: Oncology

## 2014-04-25 ENCOUNTER — Other Ambulatory Visit (HOSPITAL_BASED_OUTPATIENT_CLINIC_OR_DEPARTMENT_OTHER): Payer: Medicare Other

## 2014-04-25 ENCOUNTER — Encounter: Payer: Self-pay | Admitting: Physician Assistant

## 2014-04-25 ENCOUNTER — Ambulatory Visit (HOSPITAL_BASED_OUTPATIENT_CLINIC_OR_DEPARTMENT_OTHER): Payer: Medicare Other | Admitting: Physician Assistant

## 2014-04-25 VITALS — BP 126/73 | HR 68 | Temp 97.2°F | Resp 18 | Ht 64.0 in | Wt 140.6 lb

## 2014-04-25 DIAGNOSIS — Z853 Personal history of malignant neoplasm of breast: Secondary | ICD-10-CM | POA: Diagnosis not present

## 2014-04-25 DIAGNOSIS — M949 Disorder of cartilage, unspecified: Secondary | ICD-10-CM

## 2014-04-25 DIAGNOSIS — M899 Disorder of bone, unspecified: Secondary | ICD-10-CM | POA: Diagnosis not present

## 2014-04-25 DIAGNOSIS — Z1509 Genetic susceptibility to other malignant neoplasm: Secondary | ICD-10-CM

## 2014-04-25 DIAGNOSIS — Z1501 Genetic susceptibility to malignant neoplasm of breast: Secondary | ICD-10-CM

## 2014-04-25 LAB — COMPREHENSIVE METABOLIC PANEL (CC13)
ALK PHOS: 48 U/L (ref 40–150)
ALT: 20 U/L (ref 0–55)
AST: 18 U/L (ref 5–34)
Albumin: 3.7 g/dL (ref 3.5–5.0)
Anion Gap: 13 mEq/L — ABNORMAL HIGH (ref 3–11)
BILIRUBIN TOTAL: 0.63 mg/dL (ref 0.20–1.20)
BUN: 14.2 mg/dL (ref 7.0–26.0)
CO2: 22 meq/L (ref 22–29)
CREATININE: 0.8 mg/dL (ref 0.6–1.1)
Calcium: 8.7 mg/dL (ref 8.4–10.4)
Chloride: 107 mEq/L (ref 98–109)
GLUCOSE: 101 mg/dL (ref 70–140)
Potassium: 3.4 mEq/L — ABNORMAL LOW (ref 3.5–5.1)
Sodium: 142 mEq/L (ref 136–145)
Total Protein: 6.9 g/dL (ref 6.4–8.3)

## 2014-04-25 LAB — CBC WITH DIFFERENTIAL/PLATELET
BASO%: 0.1 % (ref 0.0–2.0)
Basophils Absolute: 0 10*3/uL (ref 0.0–0.1)
EOS ABS: 0.1 10*3/uL (ref 0.0–0.5)
EOS%: 1.4 % (ref 0.0–7.0)
HEMATOCRIT: 34.9 % (ref 34.8–46.6)
HGB: 11.9 g/dL (ref 11.6–15.9)
LYMPH%: 37.2 % (ref 14.0–49.7)
MCH: 30.2 pg (ref 25.1–34.0)
MCHC: 34.1 g/dL (ref 31.5–36.0)
MCV: 88.6 fL (ref 79.5–101.0)
MONO#: 0.6 10*3/uL (ref 0.1–0.9)
MONO%: 8 % (ref 0.0–14.0)
NEUT%: 53.3 % (ref 38.4–76.8)
NEUTROS ABS: 3.8 10*3/uL (ref 1.5–6.5)
Platelets: 185 10*3/uL (ref 145–400)
RBC: 3.94 10*6/uL (ref 3.70–5.45)
RDW: 14.6 % — ABNORMAL HIGH (ref 11.2–14.5)
WBC: 7.2 10*3/uL (ref 3.9–10.3)
lymph#: 2.7 10*3/uL (ref 0.9–3.3)

## 2014-04-25 NOTE — Progress Notes (Signed)
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  PRIOR THERAPY:  #1 patient presented with a breast mass. She underwent a mastectomy in October 2006 that revealed a left breast cancer stage II A.her final pathology revealed a multifocal ER positive PR negative HER-2/neu negative invasive ductal carcinoma.  #2 she underwent adjuvant chemotherapy consisting of 6 cycles of 5-FU epirubicin and Cytoxan.  #3 she then was begun on letrozole 2.5 mg daily in 2006. however she has been having considerable amount of pain and Dr. Truddie Coco did suggest the patient go on tamoxifen. However it is uncertain whether she went on this or not.I have discussed the use of raloxifene which I do think would be beneficial to the patient for breast cancer risk reduction as well as her bone health. She is agreeable to this.she would like to now discontinue the droloxifene due to side effects.  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  #5 patient has undergone bilateral salpingo-oophorectomy on 09/04/2013.  CURRENT THERAPY:observation  INTERVAL HISTORY: [name] 68 y.o. female returns for followup visit. Overall she's been doing well since last being seen in our office on 10/26/2013. She states that she discontinue the Evista at that time when she saw Dr. Humphrey Rolls. She does complain that her prosthesis pleural rubs the left chest area irritating this area. She states that her prosthesis and her problems have a bit of age on them. She denied any issues with fever, chills, night sweats, and cough, shortness of breath or chest pain. She denied any bone pain or fatigue. Bone density performed 03/21/2014 revealed that he T score of -1.5 the right femoral neck. Her next mammogram of the right breast is  due in September 2015.  Remainder of the 10 point review of systems is negative.  MEDICAL HISTORY: Past Medical History  Diagnosis Date  . Osteopenia 02/2014    T score -1.5 FRAX 4.4%/0.6%. No significant change from prior DEXA  . Atrophic vaginitis   . Hypertension   . Hx Breast cancer, IDC, Left, multifocal, Stage II ER+, PR - Her 2- DX 09/04/2005-  S/P TOTAL LEFT MASTECTOMY WITH NODE DISSECTION   AND CHEMO    ONCOLOGIST-  DR RUBIN--  NO RECURRENCE  . Glaucoma   . Endometrial mass   . History of cystitis   . Nocturia   . Hyperlipidemia   . BRCA2 positive 07/01/2011    ALLERGIES:  has No Known Allergies.  MEDICATIONS:  Current Outpatient Prescriptions  Medication Sig Dispense Refill  . atorvastatin (LIPITOR) 20 MG tablet       . Brimonidine Tartrate (ALPHAGAN P OP) Apply to eye 3 (three) times daily.       . Calcium Carbonate-Vit D-Min (CALTRATE 600+D PLUS PO) Take 1 tablet by mouth 2 (two) times daily.       . Cholecalciferol (VITAMIN D) 1000 UNITS capsule Take 1,000 Units by mouth daily.       . dorzolamide-timolol (COSOPT) 22.3-6.8 MG/ML ophthalmic solution Place 1 drop into both eyes 2 (two) times daily.       Marland Kitchen latanoprost (XALATAN) 0.005 % ophthalmic solution Place 1 drop into both eyes at bedtime.       Marland Kitchen losartan-hydrochlorothiazide (HYZAAR) 50-12.5 MG per tablet Take 1 tablet by mouth every morning.       Marland Kitchen  zolpidem (AMBIEN) 5 MG tablet Take 5 mg by mouth at bedtime as needed.        No current facility-administered medications for this visit.    SURGICAL HISTORY:  Past Surgical History  Procedure Laterality Date  . Ectopic pregnancy surgery  73 & 74  . [place] cath    . Hand surgery    . Dx laparoscopy  9.6.12    resulted into open salpingo-opphorectomy   . Bilateral salpingoophorectomy  9.6.12    Left side removed. Right tube and ovary never identified  . Transthoracic echocardiogram  10-14-2005    LVSF NORMAL/ EF 55-65%/ MILD MITRAL REGURG  . Dilation and  curettage of uterus  1968  . Eye surgery  2001    RIGHT EYE FOR GLAUCOMA  . Carpal tunnel release  2007    RIGHT  . Breast surgery  08-29-2005    TOTAL LEFT MASTECTOMY W/ NODE DISSECTION X4  . Hysteroscopy w/d&c  11/05/2012    Procedure: DILATATION AND CURETTAGE /HYSTEROSCOPY;  Surgeon: Bennetta Laos, MD;  Location: [facility];  Service: Gynecology;  Laterality: N/A;    REVIEW OF SYSTEMS:  Pertinent items are noted in HPI.   HEALTH MAINTENANCE:   PHYSICAL EXAMINATION: Blood pressure 126/73, pulse 68, temperature 97.2 F (36.2 C), temperature source Oral, resp. rate 18, height _0  (1.626 m), weight 140 lb 9.6 oz (63.776 kg). Body mass index is 24.12 kg/(m^2).  ECOG PERFORMANCE STATUS: 0 - Asymptomatic    General appearance: alert, cooperative and appears stated age Lymph nodes: Cervical, supraclavicular, and axillary nodes normal. Back: symmetric, no curvature. ROM normal. No CVA tenderness. Cardio: regular rate and rhythm GI: soft, non-tender; bowel sounds normal; no masses,  no organomegaly Extremities: extremities normal, atraumatic, no cyanosis or edema Neurologic: Grossly normal  Left breast surgically absent. Left chest wall without any masses or skin changes. No palpable masses in the left axilla no left upper extremity edema/lymphedema Right breast without any masses, skin changes, nipple inversion or nipple discharge. No right upper extremity edema.Nothing palpable in the axilla no masses nipple discharge or bleeding no nodularity. Left MRM chest wall exam is unremarkable.  LABORATORY DATA: Lab Results  Component Value Date   WBC 7.2 04/25/2014   HGB 11.9 04/25/2014   HCT 34.9 04/25/2014   MCV 88.6 04/25/2014   PLT 185 04/25/2014      Chemistry      Component Value Date/Time   NA 142 04/25/2014 1003   NA 141 11/03/2012 1407   K 3.4* 04/25/2014 1003   K 3.5 11/03/2012 1407   CL 110* 04/08/2013 1112   CL 105 11/03/2012 1407   CO2 22 04/25/2014 1003    CO2 27 11/03/2012 1407   BUN 14.2 04/25/2014 1003   BUN 13 11/03/2012 1407   CREATININE 0.8 04/25/2014 1003   CREATININE 0.79 11/03/2012 1407      Component Value Date/Time   CALCIUM 8.7 04/25/2014 1003   CALCIUM 9.6 11/03/2012 1407   ALKPHOS 48 04/25/2014 1003   ALKPHOS 57 02/12/2012 1121   AST 18 04/25/2014 1003   AST 24 02/12/2012 1121   ALT 20 04/25/2014 1003   ALT 28 02/12/2012 1121   BILITOT 0.63 04/25/2014 1003   BILITOT 0.6 02/12/2012 1121       RADIOGRAPHIC STUDIES:  No results found.  ASSESSMENT: 68 year old female with  #1 history of multifocal invasive ductal carcinoma of the left breast status post mastectomy in October 2006. She underwent chemotherapy with 6  cycles of FEC followed by adjuvant antiestrogen therapy with letrozole 2.5 mg daily. Clinically she has no evidence of recurrent disease. She had been on letrozole now for almost 8 years. She is no longer taking Metrazol nor Evista. She is taking calcium with vitamin D daily. S  #2 patient will need MRIs for surveillance on an annual basis   PLAN:  #1 continue observation clinically.  #2 breast MRI performed 10/10/2013 revealed no suspicious enhancement in the chest wall and normal appearance of the right breast with no evidence for malignancy. The recommendation was for a right screening mammogram in September of 2015. Patient is aware of this recommendation and will follow through.  #3 patient will followup with Dr. Lindi Adie in 6 months   #4. For the irritation related to her bras, she was given a prescription for a new prosthesis problems total of 3. Should this not completely solve the problem she'll referred and given a prescription for new left breast prosthesis.  All questions were answered. The patient knows to call the clinic with any problems, questions or concerns. We can certainly see the patient much sooner if necessary.  I spent 25 minutes counseling the patient face to face. The total time spent in the  appointment was 30 minutes.    Carlton Adam, PA-C Medical/Oncology [place] [phone] (Office)

## 2014-04-26 DIAGNOSIS — H4011X0 Primary open-angle glaucoma, stage unspecified: Secondary | ICD-10-CM | POA: Diagnosis not present

## 2014-04-26 DIAGNOSIS — H409 Unspecified glaucoma: Secondary | ICD-10-CM | POA: Diagnosis not present

## 2014-04-27 NOTE — Patient Instructions (Signed)
Followup in 6 months Be sure to get your mammogram in September of 2015

## 2014-04-28 ENCOUNTER — Telehealth: Payer: Self-pay | Admitting: Physician Assistant

## 2014-04-28 NOTE — Telephone Encounter (Signed)
LVM ADVISING NEXT APPT 10/25/14 @ 10AM. MAILED PT CALENDAR FOR DEC APPT.

## 2014-05-25 DIAGNOSIS — R059 Cough, unspecified: Secondary | ICD-10-CM | POA: Diagnosis not present

## 2014-05-25 DIAGNOSIS — R05 Cough: Secondary | ICD-10-CM | POA: Diagnosis not present

## 2014-07-05 DIAGNOSIS — H2510 Age-related nuclear cataract, unspecified eye: Secondary | ICD-10-CM | POA: Diagnosis not present

## 2014-07-05 DIAGNOSIS — H4011X0 Primary open-angle glaucoma, stage unspecified: Secondary | ICD-10-CM | POA: Diagnosis not present

## 2014-07-05 DIAGNOSIS — H409 Unspecified glaucoma: Secondary | ICD-10-CM | POA: Diagnosis not present

## 2014-07-06 ENCOUNTER — Ambulatory Visit (INDEPENDENT_AMBULATORY_CARE_PROVIDER_SITE_OTHER): Payer: Medicare Other | Admitting: Women's Health

## 2014-07-06 ENCOUNTER — Encounter: Payer: Self-pay | Admitting: Women's Health

## 2014-07-06 DIAGNOSIS — N949 Unspecified condition associated with female genital organs and menstrual cycle: Secondary | ICD-10-CM

## 2014-07-06 DIAGNOSIS — R350 Frequency of micturition: Secondary | ICD-10-CM

## 2014-07-06 DIAGNOSIS — N898 Other specified noninflammatory disorders of vagina: Secondary | ICD-10-CM | POA: Diagnosis not present

## 2014-07-06 DIAGNOSIS — R102 Pelvic and perineal pain: Secondary | ICD-10-CM

## 2014-07-06 LAB — URINALYSIS W MICROSCOPIC + REFLEX CULTURE
Bilirubin Urine: NEGATIVE
CRYSTALS: NONE SEEN
Casts: NONE SEEN
Glucose, UA: NEGATIVE mg/dL
KETONES UR: NEGATIVE mg/dL
LEUKOCYTES UA: NEGATIVE
NITRITE: NEGATIVE
Protein, ur: NEGATIVE mg/dL
Specific Gravity, Urine: 1.015 (ref 1.005–1.030)
Urobilinogen, UA: 0.2 mg/dL (ref 0.0–1.0)
pH: 7 (ref 5.0–8.0)

## 2014-07-06 LAB — WET PREP FOR TRICH, YEAST, CLUE
CLUE CELLS WET PREP: NONE SEEN
Trich, Wet Prep: NONE SEEN
Yeast Wet Prep HPF POC: NONE SEEN

## 2014-07-06 MED ORDER — METRONIDAZOLE 0.75 % VA GEL: 1.0000 | Freq: Two times a day (BID) | VAGINAL | Status: DC

## 2014-07-06 NOTE — Progress Notes (Signed)
Patient ID: [name], female   DOB: 05-22-1946, 68 y.o.   MRN: [id number] Presents with vague complaints of occasional  pain and burning with urination, some increased frequency. Reports discharge is minimal. States has a low abdominal pressure sensation for the past 2 days similar to when she had bacteria vaginosis. 2006 breast cancer. Denies fever,  nausea/vomiting/changes in bowel elimination. Postmenopausal/no bleeding.  Exam: Appears well. Abdomen soft nontender no rebound or radiation of pain. No CVAT. UA trace blood, no wbc's, 0 - 2 RBCs, rare bacteria. External genitalia within normal limits, speculum exam scant discharge, no visible erythema noted. Wet prep negative. Bimanual no CMT or adnexal fullness or tenderness.  Low abdominal pain  Plan: Urine culture pending. MetroGel vaginal cream 1 applicator at bedtime x1 per request. Will call if continued symptoms for ultrasound.

## 2014-07-08 LAB — URINE CULTURE
Colony Count: NO GROWTH
Organism ID, Bacteria: NO GROWTH

## 2014-08-07 ENCOUNTER — Ambulatory Visit (INDEPENDENT_AMBULATORY_CARE_PROVIDER_SITE_OTHER): Payer: Medicare Other | Admitting: Gynecology

## 2014-08-07 ENCOUNTER — Encounter: Payer: Self-pay | Admitting: Gynecology

## 2014-08-07 VITALS — BP 122/80 | Ht 63.75 in | Wt 141.6 lb

## 2014-08-07 DIAGNOSIS — C50912 Malignant neoplasm of unspecified site of left female breast: Secondary | ICD-10-CM

## 2014-08-07 DIAGNOSIS — N952 Postmenopausal atrophic vaginitis: Secondary | ICD-10-CM

## 2014-08-07 DIAGNOSIS — C50919 Malignant neoplasm of unspecified site of unspecified female breast: Secondary | ICD-10-CM | POA: Diagnosis not present

## 2014-08-07 DIAGNOSIS — M8580 Other specified disorders of bone density and structure, unspecified site: Secondary | ICD-10-CM

## 2014-08-07 DIAGNOSIS — M949 Disorder of cartilage, unspecified: Secondary | ICD-10-CM

## 2014-08-07 DIAGNOSIS — M899 Disorder of bone, unspecified: Secondary | ICD-10-CM | POA: Diagnosis not present

## 2014-08-07 NOTE — Patient Instructions (Signed)
You may obtain a copy of any labs that were done today by logging onto MyChart as outlined in the instructions provided with your AVS (after visit summary). The office will not call with normal lab results but certainly if there are any significant abnormalities then we will contact you.   Health Maintenance, Female A healthy lifestyle and preventative care can promote health and wellness.  Maintain regular health, dental, and eye exams.  Eat a healthy diet. Foods like vegetables, fruits, whole grains, low-fat dairy products, and lean protein foods contain the nutrients you need without too many calories. Decrease your intake of foods high in solid fats, added sugars, and salt. Get information about a proper diet from your caregiver, if necessary.  Regular physical exercise is one of the most important things you can do for your health. Most adults should get at least 150 minutes of moderate-intensity exercise (any activity that increases your heart rate and causes you to sweat) each week. In addition, most adults need muscle-strengthening exercises on 2 or more days a week.   Maintain a healthy weight. The body mass index (BMI) is a screening tool to identify possible weight problems. It provides an estimate of body fat based on height and weight. Your caregiver can help determine your BMI, and can help you achieve or maintain a healthy weight. For adults 20 years and older:  A BMI below 18.5 is considered underweight.  A BMI of 18.5 to 24.9 is normal.  A BMI of 25 to 29.9 is considered overweight.  A BMI of 30 and above is considered obese.  Maintain normal blood lipids and cholesterol by exercising and minimizing your intake of saturated fat. Eat a balanced diet with plenty of fruits and vegetables. Blood tests for lipids and cholesterol should begin at age 61 and be repeated every 5 years. If your lipid or cholesterol levels are high, you are over 50, or you are a high risk for heart  disease, you may need your cholesterol levels checked more frequently.Ongoing high lipid and cholesterol levels should be treated with medicines if diet and exercise are not effective.  If you smoke, find out from your caregiver how to quit. If you do not use tobacco, do not start.  Lung cancer screening is recommended for adults aged 33 80 years who are at high risk for developing lung cancer because of a history of smoking. Yearly low-dose computed tomography (CT) is recommended for people who have at least a 30-pack-year history of smoking and are a current smoker or have quit within the past 15 years. A pack year of smoking is smoking an average of 1 pack of cigarettes a day for 1 year (for example: 1 pack a day for 30 years or 2 packs a day for 15 years). Yearly screening should continue until the smoker has stopped smoking for at least 15 years. Yearly screening should also be stopped for people who develop a health problem that would prevent them from having lung cancer treatment.  If you are pregnant, do not drink alcohol. If you are breastfeeding, be very cautious about drinking alcohol. If you are not pregnant and choose to drink alcohol, do not exceed 1 drink per day. One drink is considered to be 12 ounces (355 mL) of beer, 5 ounces (148 mL) of wine, or 1.5 ounces (44 mL) of liquor.  Avoid use of street drugs. Do not share needles with anyone. Ask for help if you need support or instructions about stopping  the use of drugs.  High blood pressure causes heart disease and increases the risk of stroke. Blood pressure should be checked at least every 1 to 2 years. Ongoing high blood pressure should be treated with medicines, if weight loss and exercise are not effective.  If you are 59 to 70 years old, ask your caregiver if you should take aspirin to prevent strokes.  Diabetes screening involves taking a blood sample to check your fasting blood sugar level. This should be done once every 3  years, after age 91, if you are within normal weight and without risk factors for diabetes. Testing should be considered at a younger age or be carried out more frequently if you are overweight and have at least 1 risk factor for diabetes.  Breast cancer screening is essential preventative care for women. You should practice "breast self-awareness." This means understanding the normal appearance and feel of your breasts and may include breast self-examination. Any changes detected, no matter how small, should be reported to a caregiver. Women in their 66s and 30s should have a clinical breast exam (CBE) by a caregiver as part of a regular health exam every 1 to 3 years. After age 101, women should have a CBE every year. Starting at age 100, women should consider having a mammogram (breast X-ray) every year. Women who have a family history of breast cancer should talk to their caregiver about genetic screening. Women at a high risk of breast cancer should talk to their caregiver about having an MRI and a mammogram every year.  Breast cancer gene (BRCA)-related cancer risk assessment is recommended for women who have family members with BRCA-related cancers. BRCA-related cancers include breast, ovarian, tubal, and peritoneal cancers. Having family members with these cancers may be associated with an increased risk for harmful changes (mutations) in the breast cancer genes BRCA1 and BRCA2. Results of the assessment will determine the need for genetic counseling and BRCA1 and BRCA2 testing.  The Pap test is a screening test for cervical cancer. Women should have a Pap test starting at age 57. Between ages 25 and 35, Pap tests should be repeated every 2 years. Beginning at age 37, you should have a Pap test every 3 years as long as the past 3 Pap tests have been normal. If you had a hysterectomy for a problem that was not cancer or a condition that could lead to cancer, then you no longer need Pap tests. If you are  between ages 50 and 76, and you have had normal Pap tests going back 10 years, you no longer need Pap tests. If you have had past treatment for cervical cancer or a condition that could lead to cancer, you need Pap tests and screening for cancer for at least 20 years after your treatment. If Pap tests have been discontinued, risk factors (such as a new sexual partner) need to be reassessed to determine if screening should be resumed. Some women have medical problems that increase the chance of getting cervical cancer. In these cases, your caregiver may recommend more frequent screening and Pap tests.  The human papillomavirus (HPV) test is an additional test that may be used for cervical cancer screening. The HPV test looks for the virus that can cause the cell changes on the cervix. The cells collected during the Pap test can be tested for HPV. The HPV test could be used to screen women aged 44 years and older, and should be used in women of any age  who have unclear Pap test results. After the age of 55, women should have HPV testing at the same frequency as a Pap test.  Colorectal cancer can be detected and often prevented. Most routine colorectal cancer screening begins at the age of 44 and continues through age 20. However, your caregiver may recommend screening at an earlier age if you have risk factors for colon cancer. On a yearly basis, your caregiver may provide home test kits to check for hidden blood in the stool. Use of a small camera at the end of a tube, to directly examine the colon (sigmoidoscopy or colonoscopy), can detect the earliest forms of colorectal cancer. Talk to your caregiver about this at age 86, when routine screening begins. Direct examination of the colon should be repeated every 5 to 10 years through age 13, unless early forms of pre-cancerous polyps or small growths are found.  Hepatitis C blood testing is recommended for all people born from [birth year] through 1967 and any  individual with known risks for hepatitis C.  Practice safe sex. Use condoms and avoid high-risk sexual practices to reduce the spread of sexually transmitted infections (STIs). Sexually active women aged 36 and younger should be checked for Chlamydia, which is a common sexually transmitted infection. Older women with new or multiple partners should also be tested for Chlamydia. Testing for other STIs is recommended if you are sexually active and at increased risk.  Osteoporosis is a disease in which the bones lose minerals and strength with aging. This can result in serious bone fractures. The risk of osteoporosis can be identified using a bone density scan. Women ages 20 and over and women at risk for fractures or osteoporosis should discuss screening with their caregivers. Ask your caregiver whether you should be taking a calcium supplement or vitamin D to reduce the rate of osteoporosis.  Menopause can be associated with physical symptoms and risks. Hormone replacement therapy is available to decrease symptoms and risks. You should talk to your caregiver about whether hormone replacement therapy is right for you.  Use sunscreen. Apply sunscreen liberally and repeatedly throughout the day. You should seek shade when your shadow is shorter than you. Protect yourself by wearing long sleeves, pants, a wide-brimmed hat, and sunglasses year round, whenever you are outdoors.  Notify your caregiver of new moles or changes in moles, especially if there is a change in shape or color. Also notify your caregiver if a mole is larger than the size of a pencil eraser.  Stay current with your immunizations. Document Released: 05/26/2011 Document Revised: 03/07/2013 Document Reviewed: 05/26/2011 [facility] Patient Information 2014 [employer].

## 2014-08-07 NOTE — Progress Notes (Signed)
[name] 04-30-1946 [id number]        68 y.o.  G2P0020 for follow up exam.  Past medical history,surgical history, problem list, medications, allergies, family history and social history were all reviewed and documented as reviewed in the EPIC chart.  ROS:  12 system ROS performed with pertinent positives and negatives included in the history, assessment and plan.   Additional significant findings :  none   Exam: Journalist, newspaper Filed Vitals:   08/07/14 0951  BP: 122/80  Height: 5' 3.75" (1.619 m)  Weight: 141 lb 9.6 oz (64.229 kg)   General appearance:  Normal affect, orientation and appearance. Skin: Grossly normal HEENT: Without gross lesions.  No cervical or supraclavicular adenopathy. Thyroid normal.  Lungs:  Clear without wheezing, rales or rhonchi Cardiac: RR, without RMG Abdominal:  Soft, nontender, without masses, guarding, rebound, organomegaly or hernia Breasts:  Examined lying and sitting. Right without masses, retractions, discharge or axillary adenopathy.  Left chest wall without masses, axillary adenopathy Pelvic:  Ext/BUS/vagina with generalized atrophic changes  Cervix atrophic  Uterus difficult to palpate grossly normal midline mobile nontender  Adnexa  Without masses or tenderness    Anus and perineum  Normal   Rectovaginal  Normal sphincter tone without palpated masses or tenderness.    Assessment/Plan:  68 y.o. G22P0020 female for follow up exam.   1. Postmenopausal/atrophic genital changes patient without significant symptoms of hot flashes, night sweats, vaginal dryness or any vaginal bleeding. Continue to monitor. Report any vaginal bleeding. 2. Osteopenia. DEXA 02/2014 T score -1.5 FRAX 4.4%/0.6% without significant change from prior DEXA. Continue to monitor with repeat DEXA in 2 years. Increased calcium vitamin D reviewed. 3. History of breast cancer and positive BRCA 2 testing. Status post left mastectomy. Actively being followed oncologist. It  has been reviewed with her on multiple occasions her increased risk of breast cancer in her remaining breast and options for prophylactic mastectomy. Patient has always declined and is being followed with MRI and mammography. She is up-to-date with both of these. Self breast exam monthly reviewed. She is status post laparoscopic left salpingo-oophorectomy by Dr. Cherylann Banas. Her right tube and ovary was never found and suspected to have been removed with her ectopic pregnancy. 4. Pap smear 2014. No Pap smear done today. No history of significant abnormal Pap smears. Options to stop screening per current screening guidelines and she is over the age of 21 versus less frequent screening intervals reviewed. Will readdress on annual basis. 5. Colonoscopy 2014. Repeat at their recommended interval. 6. Health maintenance. No routine blood work done as she reports this done at her primary physician's office. Follow up one year, sooner as needed.   Note: This document was prepared with digital dictation and possible smart phrase technology. Any transcriptional errors that result from this process are unintentional.   [name] MD, 10:18 AM 08/07/2014

## 2014-08-08 LAB — URINALYSIS W MICROSCOPIC + REFLEX CULTURE
Bacteria, UA: NONE SEEN
Bilirubin Urine: NEGATIVE
Casts: NONE SEEN
Crystals: NONE SEEN
Glucose, UA: NEGATIVE mg/dL
Hgb urine dipstick: NEGATIVE
Ketones, ur: NEGATIVE mg/dL
LEUKOCYTES UA: NEGATIVE
NITRITE: NEGATIVE
PROTEIN: NEGATIVE mg/dL
SQUAMOUS EPITHELIAL / LPF: NONE SEEN
Specific Gravity, Urine: 1.027 (ref 1.005–1.030)
UROBILINOGEN UA: 0.2 mg/dL (ref 0.0–1.0)
pH: 5 (ref 5.0–8.0)

## 2014-08-14 ENCOUNTER — Encounter: Payer: Self-pay | Admitting: Gynecology

## 2014-08-14 DIAGNOSIS — Z1231 Encounter for screening mammogram for malignant neoplasm of breast: Secondary | ICD-10-CM | POA: Diagnosis not present

## 2014-08-14 DIAGNOSIS — Z853 Personal history of malignant neoplasm of breast: Secondary | ICD-10-CM | POA: Diagnosis not present

## 2014-08-28 ENCOUNTER — Telehealth: Payer: Self-pay

## 2014-08-28 NOTE — Telephone Encounter (Signed)
Mammo results rcvd from [facility] 08/14/14 Dr Marcelo Baldy.  Copy to Dr Lindi Adie, Original to scan.

## 2014-08-30 DIAGNOSIS — Z23 Encounter for immunization: Secondary | ICD-10-CM | POA: Diagnosis not present

## 2014-09-04 ENCOUNTER — Telehealth: Payer: Self-pay | Admitting: Hematology and Oncology

## 2014-09-04 NOTE — Telephone Encounter (Signed)
per [facility]G to r/s pt appt due to BC-r/s pt appt-cld & spoke to pt and adv of new time & date

## 2014-09-25 ENCOUNTER — Encounter: Payer: Self-pay | Admitting: Gynecology

## 2014-09-25 DIAGNOSIS — I10 Essential (primary) hypertension: Secondary | ICD-10-CM | POA: Diagnosis not present

## 2014-09-25 DIAGNOSIS — M8580 Other specified disorders of bone density and structure, unspecified site: Secondary | ICD-10-CM | POA: Diagnosis not present

## 2014-09-25 DIAGNOSIS — M859 Disorder of bone density and structure, unspecified: Secondary | ICD-10-CM | POA: Diagnosis not present

## 2014-09-25 DIAGNOSIS — Z008 Encounter for other general examination: Secondary | ICD-10-CM | POA: Diagnosis not present

## 2014-09-25 DIAGNOSIS — E785 Hyperlipidemia, unspecified: Secondary | ICD-10-CM | POA: Diagnosis not present

## 2014-10-02 DIAGNOSIS — G4700 Insomnia, unspecified: Secondary | ICD-10-CM | POA: Diagnosis not present

## 2014-10-02 DIAGNOSIS — M859 Disorder of bone density and structure, unspecified: Secondary | ICD-10-CM | POA: Diagnosis not present

## 2014-10-02 DIAGNOSIS — H9319 Tinnitus, unspecified ear: Secondary | ICD-10-CM | POA: Diagnosis not present

## 2014-10-02 DIAGNOSIS — Z0000 Encounter for general adult medical examination without abnormal findings: Secondary | ICD-10-CM | POA: Diagnosis not present

## 2014-10-02 DIAGNOSIS — E785 Hyperlipidemia, unspecified: Secondary | ICD-10-CM | POA: Diagnosis not present

## 2014-10-02 DIAGNOSIS — I10 Essential (primary) hypertension: Secondary | ICD-10-CM | POA: Diagnosis not present

## 2014-10-02 DIAGNOSIS — Z6824 Body mass index (BMI) 24.0-24.9, adult: Secondary | ICD-10-CM | POA: Diagnosis not present

## 2014-10-02 DIAGNOSIS — H409 Unspecified glaucoma: Secondary | ICD-10-CM | POA: Diagnosis not present

## 2014-10-02 DIAGNOSIS — C50919 Malignant neoplasm of unspecified site of unspecified female breast: Secondary | ICD-10-CM | POA: Diagnosis not present

## 2014-10-06 DIAGNOSIS — Z1212 Encounter for screening for malignant neoplasm of rectum: Secondary | ICD-10-CM | POA: Diagnosis not present

## 2014-10-25 ENCOUNTER — Telehealth: Payer: Self-pay | Admitting: Hematology and Oncology

## 2014-10-25 ENCOUNTER — Ambulatory Visit (HOSPITAL_BASED_OUTPATIENT_CLINIC_OR_DEPARTMENT_OTHER): Payer: Medicare Other | Admitting: Hematology and Oncology

## 2014-10-25 ENCOUNTER — Ambulatory Visit: Payer: Medicare Other | Admitting: Hematology and Oncology

## 2014-10-25 ENCOUNTER — Other Ambulatory Visit: Payer: Medicare Other

## 2014-10-25 ENCOUNTER — Other Ambulatory Visit (HOSPITAL_BASED_OUTPATIENT_CLINIC_OR_DEPARTMENT_OTHER): Payer: Medicare Other

## 2014-10-25 DIAGNOSIS — M8580 Other specified disorders of bone density and structure, unspecified site: Secondary | ICD-10-CM | POA: Diagnosis not present

## 2014-10-25 DIAGNOSIS — Z853 Personal history of malignant neoplasm of breast: Secondary | ICD-10-CM | POA: Diagnosis not present

## 2014-10-25 DIAGNOSIS — C50312 Malignant neoplasm of lower-inner quadrant of left female breast: Secondary | ICD-10-CM

## 2014-10-25 LAB — COMPREHENSIVE METABOLIC PANEL (CC13)
ALK PHOS: 59 U/L (ref 40–150)
ALT: 20 U/L (ref 0–55)
AST: 22 U/L (ref 5–34)
Albumin: 4.2 g/dL (ref 3.5–5.0)
Anion Gap: 10 mEq/L (ref 3–11)
BUN: 16.9 mg/dL (ref 7.0–26.0)
CO2: 26 mEq/L (ref 22–29)
CREATININE: 0.9 mg/dL (ref 0.6–1.1)
Calcium: 10.1 mg/dL (ref 8.4–10.4)
Chloride: 106 mEq/L (ref 98–109)
Glucose: 99 mg/dl (ref 70–140)
Potassium: 4 mEq/L (ref 3.5–5.1)
Sodium: 143 mEq/L (ref 136–145)
Total Bilirubin: 0.53 mg/dL (ref 0.20–1.20)
Total Protein: 7.8 g/dL (ref 6.4–8.3)

## 2014-10-25 LAB — CBC WITH DIFFERENTIAL/PLATELET
BASO%: 0.7 % (ref 0.0–2.0)
BASOS ABS: 0.1 10*3/uL (ref 0.0–0.1)
EOS%: 2 % (ref 0.0–7.0)
Eosinophils Absolute: 0.2 10*3/uL (ref 0.0–0.5)
HCT: 40.8 % (ref 34.8–46.6)
HEMOGLOBIN: 13.2 g/dL (ref 11.6–15.9)
LYMPH#: 3.3 10*3/uL (ref 0.9–3.3)
LYMPH%: 35.8 % (ref 14.0–49.7)
MCH: 29.5 pg (ref 25.1–34.0)
MCHC: 32.4 g/dL (ref 31.5–36.0)
MCV: 91.1 fL (ref 79.5–101.0)
MONO#: 0.8 10*3/uL (ref 0.1–0.9)
MONO%: 9 % (ref 0.0–14.0)
NEUT#: 4.8 10*3/uL (ref 1.5–6.5)
NEUT%: 52.5 % (ref 38.4–76.8)
Platelets: 226 10*3/uL (ref 145–400)
RBC: 4.47 10*6/uL (ref 3.70–5.45)
RDW: 15 % — ABNORMAL HIGH (ref 11.2–14.5)
WBC: 9.2 10*3/uL (ref 3.9–10.3)

## 2014-10-25 NOTE — Assessment & Plan Note (Addendum)
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 We will order the MRI on the right breast for this year as well as for next year  Survivorship:Discussed the importance of physical exercise in decreasing the likelihood of breast cancer recurrence. Recommended 30 mins daily 6 days a week of either brisk walking or cycling or swimming. Encouraged patient to eat more fruits and vegetables and decrease red meat.   Return to clinic in one year for followup and to continue with surveillance plan.

## 2014-10-25 NOTE — Progress Notes (Signed)
Patient Care Team: Haywood Pao, MD as PCP - General (Internal Medicine)  DIAGNOSIS: No matching staging information was found for the patient.  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 with high-grade DCIS 1/5 lymph node with Micrometastases ER 99%, PR 0%, Ki-67 45%, HER-2 2+ fish negative   10/14/2005 - 01/23/2006 Chemotherapy Adjuvant chemotherapy with 6 cycles of FEC   02/23/2006 -  Anti-estrogen oral therapy Letrozole 2.5 mg daily changed to tamoxifen due to pain but she did not take this switched to raloxifene   09/04/2013 Surgery Bilateral salpingo-oophorectomy (genetic testing revealed BRCA2 gene mutation positivity)    CHIEF COMPLIANT: follow up of breast cancer BRCA2 mutation  INTERVAL HISTORY: [name] is a 68 year old Caucasian female with above-mentioned history of left breast cancer. She was diagnosed with multicentric invasive ductal carcinoma 2006 and she underwent left mastectomy followed by adjuvant chemotherapy and antiestrogen therapy for about 8 years. She discontinued antiestrogen therapy 2013. She is becoming annually for mammograms and MRIs. Because she is BRCA2 mutation. All of her sisters also have BRCA2 mutations. This year she had a mammogram but did not have an MRI. Denies any lumps or nodules in the breast or  REVIEW OF SYSTEMS:   Constitutional: Denies fevers, chills or abnormal weight loss Eyes: Denies blurriness of vision Ears, nose, mouth, throat, and face: Denies mucositis or sore throat Respiratory: Denies cough, dyspnea or wheezes Cardiovascular: Denies palpitation, chest discomfort or lower extremity swelling Gastrointestinal:  Denies nausea, heartburn or change in bowel habits Skin: Denies abnormal skin rashes Lymphatics: Denies new lymphadenopathy or easy bruising Neurological:Denies numbness,  tingling or new weaknesses Behavioral/Psych: Mood is stable, no new changes  Breast:  denies any pain or lumps or nodules in either breasts All other systems were reviewed with the patient and are negative.  I have reviewed the past medical history, past surgical history, social history and family history with the patient and they are unchanged from previous note.  ALLERGIES:  has No Known Allergies.  MEDICATIONS:  Current Outpatient Prescriptions  Medication Sig Dispense Refill  . atorvastatin (LIPITOR) 20 MG tablet     . Brimonidine Tartrate (ALPHAGAN P OP) Apply to eye 3 (three) times daily.     . Calcium Carbonate-Vit D-Min (CALTRATE 600+D PLUS PO) Take 1 tablet by mouth 2 (two) times daily.     . Cholecalciferol (VITAMIN D) 1000 UNITS capsule Take 1,000 Units by mouth daily.     . dorzolamide-timolol (COSOPT) 22.3-6.8 MG/ML ophthalmic solution Place 1 drop into both eyes 2 (two) times daily.     Marland Kitchen latanoprost (XALATAN) 0.005 % ophthalmic solution Place 1 drop into both eyes at bedtime.     Marland Kitchen losartan-hydrochlorothiazide (HYZAAR) 50-12.5 MG per tablet Take 1 tablet by mouth every morning.     . zolpidem (AMBIEN) 5 MG tablet Take 5 mg by mouth at bedtime as needed.      No current facility-administered medications for this visit.    PHYSICAL EXAMINATION: ECOG PERFORMANCE STATUS: 0 - Asymptomatic  Filed Vitals:   10/25/14 1251  BP: 123/58  Pulse: 71  Temp: 98.1 F (36.7 C)  Resp: 18   Filed Weights   10/25/14 1251  Weight: 137 lb 4.8 oz (62.279 kg)    GENERAL:alert, no distress and comfortable SKIN: skin color, texture, turgor are normal, no rashes or significant lesions EYES: normal,  Conjunctiva are pink and non-injected, sclera clear OROPHARYNX:no exudate, no erythema and lips, buccal mucosa, and tongue normal  NECK: supple, thyroid normal size, non-tender, without nodularity LYMPH:  no palpable lymphadenopathy in the cervical, axillary or inguinal LUNGS: clear to  auscultation and percussion with normal breathing effort HEART: regular rate & rhythm and no murmurs and no lower extremity edema ABDOMEN:abdomen soft, non-tender and normal bowel sounds Musculoskeletal:no cyanosis of digits and no clubbing  NEURO: alert & oriented x 3 with fluent speech, no focal motor/sensory deficits BREAST: No palpable masses or nodules in either right breast left chest wall and axilla are normal. No palpable axillary supraclavicular or infraclavicular adenopathy no breast tenderness or nipple discharge.   LABORATORY DATA:  I have reviewed the data as listed   Chemistry      Component Value Date/Time   NA 143 10/25/2014 1222   NA 141 11/03/2012 1407   K 4.0 10/25/2014 1222   K 3.5 11/03/2012 1407   CL 110* 04/08/2013 1112   CL 105 11/03/2012 1407   CO2 26 10/25/2014 1222   CO2 27 11/03/2012 1407   BUN 16.9 10/25/2014 1222   BUN 13 11/03/2012 1407   CREATININE 0.9 10/25/2014 1222   CREATININE 0.79 11/03/2012 1407      Component Value Date/Time   CALCIUM 10.1 10/25/2014 1222   CALCIUM 9.6 11/03/2012 1407   ALKPHOS 59 10/25/2014 1222   ALKPHOS 57 02/12/2012 1121   AST 22 10/25/2014 1222   AST 24 02/12/2012 1121   ALT 20 10/25/2014 1222   ALT 28 02/12/2012 1121   BILITOT 0.53 10/25/2014 1222   BILITOT 0.6 02/12/2012 1121       Lab Results  Component Value Date   WBC 9.2 10/25/2014   HGB 13.2 10/25/2014   HCT 40.8 10/25/2014   MCV 91.1 10/25/2014   PLT 226 10/25/2014   NEUTROABS 4.8 10/25/2014     RADIOGRAPHIC STUDIES: I have personally reviewed the radiology reports and agreed with their findings. 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 We will order the MRI on the right breast for this year as well as for next year  Survivorship:Discussed the importance of physical exercise in decreasing the likelihood of breast cancer recurrence. Recommended 30 mins daily 6 days a week of either brisk walking or cycling or swimming. Encouraged patient to eat more fruits and vegetables and decrease red meat.   Return to clinic in one year for followup and to continue with surveillance plan.    Orders Placed This Encounter  Procedures  . MR Breast Right W Contrast    Standing Status: Future     Number of Occurrences:      Standing Expiration Date: 10/25/2015    Order Specific Question:  Reason for Exam (SYMPTOM  OR DIAGNOSIS REQUIRED)    Answer:  BRCA2 mutation    Order Specific Question:  Preferred imaging location?    Answer:  [facility]    Order Specific Question:  Does the patient have a pacemaker or implanted devices?    Answer:  No    Order Specific Question:  What is the patient's sedation requirement?    Answer:  No Sedation  . MR Breast Right W Contrast    Standing Status: Future     Number of Occurrences:      Standing Expiration Date: 10/25/2015    Order Specific Question:  Reason for Exam (SYMPTOM  OR DIAGNOSIS REQUIRED)    Answer:  BRCA 2 mutation carrier    Order Specific Question:  Preferred imaging location?    Answer:  [facility]    Order Specific Question:  Does the patient have a pacemaker or implanted devices?    Answer:  No    Order Specific Question:  What is the patient's sedation requirement?    Answer:  No Sedation  . CBC with Differential    Standing Status: Future     Number of Occurrences:      Standing Expiration Date: 10/25/2015  . Comprehensive metabolic panel (Cmet) - [facility]    Standing Status: Future      Number of Occurrences:      Standing Expiration Date: 10/25/2015   The patient has a good understanding of the overall plan. she agrees with it. She will call with any problems that may develop before her next visit here.   Rulon Eisenmenger, MD 10/25/2014 1:12 PM

## 2014-10-25 NOTE — Addendum Note (Signed)
Addended by: Prentiss Bells on: 10/25/2014 01:32 PM   Modules accepted: Orders

## 2014-11-01 ENCOUNTER — Other Ambulatory Visit: Payer: Self-pay | Admitting: *Deleted

## 2014-11-07 ENCOUNTER — Telehealth: Payer: Self-pay

## 2014-11-07 NOTE — Telephone Encounter (Signed)
Left msg for patient with number for [facility] and instructions to call them for MRI appt.

## 2014-11-08 DIAGNOSIS — H4011X3 Primary open-angle glaucoma, severe stage: Secondary | ICD-10-CM | POA: Diagnosis not present

## 2014-11-08 DIAGNOSIS — H524 Presbyopia: Secondary | ICD-10-CM | POA: Diagnosis not present

## 2014-11-08 DIAGNOSIS — H4011X2 Primary open-angle glaucoma, moderate stage: Secondary | ICD-10-CM | POA: Diagnosis not present

## 2014-11-12 ENCOUNTER — Other Ambulatory Visit: Payer: Medicare Other

## 2014-11-13 ENCOUNTER — Ambulatory Visit
Admission: RE | Admit: 2014-11-13 | Discharge: 2014-11-13 | Disposition: A | Discharge status: Home / Self Care | Payer: Medicare Other | Source: Ambulatory Visit | Attending: Hematology and Oncology | Admitting: Hematology and Oncology

## 2014-11-13 DIAGNOSIS — Z853 Personal history of malignant neoplasm of breast: Secondary | ICD-10-CM | POA: Diagnosis not present

## 2014-11-13 DIAGNOSIS — C50312 Malignant neoplasm of lower-inner quadrant of left female breast: Secondary | ICD-10-CM

## 2014-11-13 MED ORDER — GADOBENATE DIMEGLUMINE 529 MG/ML IV SOLN
13.0000 mL | Freq: Once | INTRAVENOUS | Status: AC | PRN
  Administered 2014-11-13: 13 mL via INTRAVENOUS

## 2014-11-29 DIAGNOSIS — Z6824 Body mass index (BMI) 24.0-24.9, adult: Secondary | ICD-10-CM | POA: Diagnosis not present

## 2014-11-29 DIAGNOSIS — J069 Acute upper respiratory infection, unspecified: Secondary | ICD-10-CM | POA: Diagnosis not present

## 2014-11-29 DIAGNOSIS — R0781 Pleurodynia: Secondary | ICD-10-CM | POA: Diagnosis not present

## 2014-11-29 DIAGNOSIS — R0602 Shortness of breath: Secondary | ICD-10-CM | POA: Diagnosis not present

## 2014-11-29 DIAGNOSIS — R079 Chest pain, unspecified: Secondary | ICD-10-CM | POA: Diagnosis not present

## 2015-02-26 ENCOUNTER — Ambulatory Visit (INDEPENDENT_AMBULATORY_CARE_PROVIDER_SITE_OTHER): Payer: Medicare Other | Admitting: Gynecology

## 2015-02-26 ENCOUNTER — Encounter: Payer: Self-pay | Admitting: Gynecology

## 2015-02-26 VITALS — BP 118/76

## 2015-02-26 DIAGNOSIS — R350 Frequency of micturition: Secondary | ICD-10-CM | POA: Diagnosis not present

## 2015-02-26 DIAGNOSIS — N9489 Other specified conditions associated with female genital organs and menstrual cycle: Secondary | ICD-10-CM | POA: Diagnosis not present

## 2015-02-26 DIAGNOSIS — N898 Other specified noninflammatory disorders of vagina: Secondary | ICD-10-CM

## 2015-02-26 LAB — URINALYSIS W MICROSCOPIC + REFLEX CULTURE
BILIRUBIN URINE: NEGATIVE
Casts: NONE SEEN
Crystals: NONE SEEN
Glucose, UA: NEGATIVE mg/dL
Ketones, ur: NEGATIVE mg/dL
LEUKOCYTES UA: NEGATIVE
NITRITE: NEGATIVE
Protein, ur: NEGATIVE mg/dL
SPECIFIC GRAVITY, URINE: 1.025 (ref 1.005–1.030)
Urobilinogen, UA: 0.2 mg/dL (ref 0.0–1.0)
pH: 5 (ref 5.0–8.0)

## 2015-02-26 LAB — WET PREP FOR TRICH, YEAST, CLUE
CLUE CELLS WET PREP: NONE SEEN
TRICH WET PREP: NONE SEEN
Yeast Wet Prep HPF POC: NONE SEEN

## 2015-02-26 MED ORDER — SULFAMETHOXAZOLE-TRIMETHOPRIM 800-160 MG PO TABS: 1.0000 | ORAL_TABLET | Freq: Two times a day (BID) | ORAL | Status: DC

## 2015-02-26 NOTE — Progress Notes (Signed)
[name] [birth year] [id number]        69 y.o.  G2P0020 Presents with one-week history of low back pain and urinary frequency. Also notes some odor present clear whether it's truly vaginal or associated with her urine. No itching, irritation or significant discharge. No dysuria, urgency. Does have some suprapubic discomfort. No fever chills nausea vomiting diarrhea constipation.  Past medical history,surgical history, problem list, medications, allergies, family history and social history were all reviewed and documented in the EPIC chart.  Directed ROS with pertinent positives and negatives documented in the history of present illness/assessment and plan.  Exam: Kim assistant Filed Vitals:   02/26/15 0956  BP: 118/76   General appearance:  Normal Spine straight without CVA tenderness Abdomen soft nontender without masses guarding rebound Pelvic external BUS vagina with atrophic changes. Cervix atrophic. Uterus small difficult to palpate.  Bimanual exam without masses or tenderness.  Assessment/Plan:  69 y.o. G2P0020 with above symptoms and exam. Wet prep is negative. Urinalysis shows few bacteria. Urine culture pending. Patient states her back always hurts like this when she is getting a urinary tract infection. We'll cover with Septra DS 1 by mouth twice a day 3 days. Follow up if symptoms persist, worsen or recur.    [name] MD, 10:43 AM 02/26/2015

## 2015-02-26 NOTE — Patient Instructions (Signed)
Take the Septra antibiotic twice daily for 3 days. Follow up if your symptoms persist, worsen or recur. 

## 2015-02-28 LAB — URINE CULTURE
Colony Count: NO GROWTH
Organism ID, Bacteria: NO GROWTH

## 2015-03-01 DIAGNOSIS — H4011X3 Primary open-angle glaucoma, severe stage: Secondary | ICD-10-CM | POA: Diagnosis not present

## 2015-03-01 DIAGNOSIS — H4011X2 Primary open-angle glaucoma, moderate stage: Secondary | ICD-10-CM | POA: Diagnosis not present

## 2015-03-01 DIAGNOSIS — H2513 Age-related nuclear cataract, bilateral: Secondary | ICD-10-CM | POA: Diagnosis not present

## 2015-03-01 DIAGNOSIS — H25013 Cortical age-related cataract, bilateral: Secondary | ICD-10-CM | POA: Diagnosis not present

## 2015-03-02 ENCOUNTER — Telehealth: Payer: Self-pay | Admitting: *Deleted

## 2015-03-02 NOTE — Telephone Encounter (Signed)
Pt states that she still has back pain and vag odor. Seen by TF Monday 4/4 treated with Septra due to UA results. Culture reported out no growth. Pt requesting Metrogel. Please advise.  KW CMA

## 2015-03-02 NOTE — Telephone Encounter (Signed)
Message left

## 2015-03-02 NOTE — Telephone Encounter (Signed)
Urine culture negative, wet prep negative, best to try over-the-counter Azo, and over-the-counter refresh vaginal cream.

## 2015-03-12 NOTE — Telephone Encounter (Signed)
Pt never called back therefore encounter closed KW CMA

## 2015-03-27 DIAGNOSIS — H2511 Age-related nuclear cataract, right eye: Secondary | ICD-10-CM | POA: Diagnosis not present

## 2015-04-02 DIAGNOSIS — H9319 Tinnitus, unspecified ear: Secondary | ICD-10-CM | POA: Diagnosis not present

## 2015-04-02 DIAGNOSIS — Z5181 Encounter for therapeutic drug level monitoring: Secondary | ICD-10-CM | POA: Diagnosis not present

## 2015-04-02 DIAGNOSIS — E785 Hyperlipidemia, unspecified: Secondary | ICD-10-CM | POA: Diagnosis not present

## 2015-04-02 DIAGNOSIS — I10 Essential (primary) hypertension: Secondary | ICD-10-CM | POA: Diagnosis not present

## 2015-04-02 DIAGNOSIS — H409 Unspecified glaucoma: Secondary | ICD-10-CM | POA: Diagnosis not present

## 2015-04-02 DIAGNOSIS — Z23 Encounter for immunization: Secondary | ICD-10-CM | POA: Diagnosis not present

## 2015-04-02 DIAGNOSIS — D649 Anemia, unspecified: Secondary | ICD-10-CM | POA: Diagnosis not present

## 2015-04-02 DIAGNOSIS — Z1389 Encounter for screening for other disorder: Secondary | ICD-10-CM | POA: Diagnosis not present

## 2015-04-02 DIAGNOSIS — F321 Major depressive disorder, single episode, moderate: Secondary | ICD-10-CM | POA: Diagnosis not present

## 2015-04-02 DIAGNOSIS — M859 Disorder of bone density and structure, unspecified: Secondary | ICD-10-CM | POA: Diagnosis not present

## 2015-04-02 DIAGNOSIS — G4700 Insomnia, unspecified: Secondary | ICD-10-CM | POA: Diagnosis not present

## 2015-04-10 DIAGNOSIS — H04129 Dry eye syndrome of unspecified lacrimal gland: Secondary | ICD-10-CM | POA: Diagnosis not present

## 2015-04-13 DIAGNOSIS — H2512 Age-related nuclear cataract, left eye: Secondary | ICD-10-CM | POA: Diagnosis not present

## 2015-04-13 DIAGNOSIS — H1811 Bullous keratopathy, right eye: Secondary | ICD-10-CM | POA: Diagnosis not present

## 2015-04-13 DIAGNOSIS — H4011X4 Primary open-angle glaucoma, indeterminate stage: Secondary | ICD-10-CM | POA: Diagnosis not present

## 2015-04-13 DIAGNOSIS — Z961 Presence of intraocular lens: Secondary | ICD-10-CM | POA: Diagnosis not present

## 2015-05-07 DIAGNOSIS — Z961 Presence of intraocular lens: Secondary | ICD-10-CM | POA: Diagnosis not present

## 2015-05-07 DIAGNOSIS — Z87891 Personal history of nicotine dependence: Secondary | ICD-10-CM | POA: Diagnosis not present

## 2015-05-07 DIAGNOSIS — Z853 Personal history of malignant neoplasm of breast: Secondary | ICD-10-CM | POA: Diagnosis not present

## 2015-05-07 DIAGNOSIS — H02834 Dermatochalasis of left upper eyelid: Secondary | ICD-10-CM | POA: Diagnosis not present

## 2015-05-07 DIAGNOSIS — H2512 Age-related nuclear cataract, left eye: Secondary | ICD-10-CM | POA: Diagnosis not present

## 2015-05-07 DIAGNOSIS — H1811 Bullous keratopathy, right eye: Secondary | ICD-10-CM | POA: Diagnosis not present

## 2015-05-07 DIAGNOSIS — Z48810 Encounter for surgical aftercare following surgery on the sense organs: Secondary | ICD-10-CM | POA: Diagnosis not present

## 2015-05-07 DIAGNOSIS — Z83511 Family history of glaucoma: Secondary | ICD-10-CM | POA: Diagnosis not present

## 2015-05-07 DIAGNOSIS — H02831 Dermatochalasis of right upper eyelid: Secondary | ICD-10-CM | POA: Diagnosis not present

## 2015-05-07 DIAGNOSIS — H4011X4 Primary open-angle glaucoma, indeterminate stage: Secondary | ICD-10-CM | POA: Diagnosis not present

## 2015-05-08 ENCOUNTER — Ambulatory Visit (INDEPENDENT_AMBULATORY_CARE_PROVIDER_SITE_OTHER): Payer: Medicare Other | Admitting: Women's Health

## 2015-05-08 ENCOUNTER — Encounter: Payer: Self-pay | Admitting: Women's Health

## 2015-05-08 DIAGNOSIS — N760 Acute vaginitis: Secondary | ICD-10-CM

## 2015-05-08 DIAGNOSIS — B9689 Other specified bacterial agents as the cause of diseases classified elsewhere: Secondary | ICD-10-CM

## 2015-05-08 DIAGNOSIS — R300 Dysuria: Secondary | ICD-10-CM | POA: Diagnosis not present

## 2015-05-08 DIAGNOSIS — A499 Bacterial infection, unspecified: Secondary | ICD-10-CM

## 2015-05-08 LAB — URINALYSIS W MICROSCOPIC + REFLEX CULTURE
BILIRUBIN URINE: NEGATIVE
GLUCOSE, UA: NEGATIVE mg/dL
Hgb urine dipstick: NEGATIVE
Ketones, ur: NEGATIVE mg/dL
Leukocytes, UA: NEGATIVE
NITRITE: NEGATIVE
PH: 5 (ref 5.0–8.0)
Specific Gravity, Urine: >1.03 — ABNORMAL HIGH (ref 1.005–1.030)
Urobilinogen, UA: 0.2 mg/dL (ref 0.0–1.0)

## 2015-05-08 LAB — WET PREP FOR TRICH, YEAST, CLUE
Trich, Wet Prep: NONE SEEN
WBC, Wet Prep HPF POC: NONE SEEN
Yeast Wet Prep HPF POC: NONE SEEN

## 2015-05-08 MED ORDER — METRONIDAZOLE 0.75 % VA GEL: VAGINAL | Status: DC

## 2015-05-08 NOTE — Progress Notes (Signed)
Patient ID: [name], female   DOB: 1946-11-08, 69 y.o.   MRN: [id number] Presents with complaint of burning with urgency with urination and vaginal discharge for the past week. Denies abdominal pain, fever or pain at end of stream of urination. Past month numerous problems with her right eye pain, using numerous eyedrops and saw specialists, symptoms are better. Postmenopausal/no bleeding/no HRT.  Exam: Appears well. External genitalia within normal limits, speculum exam atrophic, moderate amount of a white discharge wet prep positive for amines, moderate clues and TNTC bacteria. Bimanual no CMT. UA: Negative  Bacteria vaginosis  Plan: Urine culture pending. MetroGel vaginal cream 1 applicator at bedtime 5, alcohol precautions reviewed. Instructed to call if symptoms do not resolve.

## 2015-05-08 NOTE — Patient Instructions (Signed)

## 2015-05-10 LAB — URINE CULTURE
Colony Count: NO GROWTH
Organism ID, Bacteria: NO GROWTH

## 2015-05-31 DIAGNOSIS — H04129 Dry eye syndrome of unspecified lacrimal gland: Secondary | ICD-10-CM | POA: Diagnosis not present

## 2015-05-31 DIAGNOSIS — S0501XA Injury of conjunctiva and corneal abrasion without foreign body, right eye, initial encounter: Secondary | ICD-10-CM | POA: Diagnosis not present

## 2015-05-31 DIAGNOSIS — H18231 Secondary corneal edema, right eye: Secondary | ICD-10-CM | POA: Diagnosis not present

## 2015-05-31 DIAGNOSIS — H4011X3 Primary open-angle glaucoma, severe stage: Secondary | ICD-10-CM | POA: Diagnosis not present

## 2015-06-28 DIAGNOSIS — S0501XA Injury of conjunctiva and corneal abrasion without foreign body, right eye, initial encounter: Secondary | ICD-10-CM | POA: Diagnosis not present

## 2015-06-28 DIAGNOSIS — H18231 Secondary corneal edema, right eye: Secondary | ICD-10-CM | POA: Diagnosis not present

## 2015-06-28 DIAGNOSIS — H4011X3 Primary open-angle glaucoma, severe stage: Secondary | ICD-10-CM | POA: Diagnosis not present

## 2015-06-28 DIAGNOSIS — H04129 Dry eye syndrome of unspecified lacrimal gland: Secondary | ICD-10-CM | POA: Diagnosis not present

## 2015-07-26 DIAGNOSIS — S0501XA Injury of conjunctiva and corneal abrasion without foreign body, right eye, initial encounter: Secondary | ICD-10-CM | POA: Diagnosis not present

## 2015-07-26 DIAGNOSIS — H04129 Dry eye syndrome of unspecified lacrimal gland: Secondary | ICD-10-CM | POA: Diagnosis not present

## 2015-07-26 DIAGNOSIS — H21511 Anterior synechiae (iris), right eye: Secondary | ICD-10-CM | POA: Diagnosis not present

## 2015-07-26 DIAGNOSIS — H18231 Secondary corneal edema, right eye: Secondary | ICD-10-CM | POA: Diagnosis not present

## 2015-08-09 ENCOUNTER — Encounter: Payer: Self-pay | Admitting: Gynecology

## 2015-08-09 ENCOUNTER — Ambulatory Visit (INDEPENDENT_AMBULATORY_CARE_PROVIDER_SITE_OTHER): Payer: Medicare Other | Admitting: Gynecology

## 2015-08-09 VITALS — BP 120/76 | Ht 64.0 in | Wt 130.0 lb

## 2015-08-09 DIAGNOSIS — N949 Unspecified condition associated with female genital organs and menstrual cycle: Secondary | ICD-10-CM | POA: Diagnosis not present

## 2015-08-09 DIAGNOSIS — Z01419 Encounter for gynecological examination (general) (routine) without abnormal findings: Secondary | ICD-10-CM | POA: Diagnosis not present

## 2015-08-09 DIAGNOSIS — N952 Postmenopausal atrophic vaginitis: Secondary | ICD-10-CM

## 2015-08-09 DIAGNOSIS — R102 Pelvic and perineal pain: Secondary | ICD-10-CM

## 2015-08-09 NOTE — Progress Notes (Signed)
[name] 04/12/1946 [id number]        69 y.o.  G2P0020 for breast and pelvic exam. Several issues noted below.  Past medical history,surgical history, problem list, medications, allergies, family history and social history were all reviewed and documented as reviewed in the EPIC chart.  ROS:  Performed with pertinent positives and negatives included in the history, assessment and plan.   Additional significant findings :  none   Exam: Kim Counsellor Vitals:   08/09/15 1032  BP: 120/76  Height: $Remove'5\' 4"'hsThZjI$  (1.626 m)  Weight: 130 lb (58.968 kg)   General appearance:  Normal affect, orientation and appearance. Skin: Grossly normal HEENT: Without gross lesions.  No cervical or supraclavicular adenopathy. Thyroid normal.  Lungs:  Clear without wheezing, rales or rhonchi Cardiac: RR, without RMG Abdominal:  Soft, nontender, without masses, guarding, rebound, organomegaly or hernia Breasts:  Examined lying and sitting. Right without masses, retractions, discharge or axillary adenopathy.  Left status post mastectomy with chest wall without masses or axillary adenopathy Pelvic:  Ext/BUS/vagina with atrophic changes  Cervix atrophic  Uterus anteverted, normal size, shape and contour, midline and mobile nontender   Adnexa  Without masses or tenderness    Anus and perineum  Normal   Rectovaginal  Normal sphincter tone without palpated masses or tenderness.    Assessment/Plan:  69 y.o. G19P0020 female for breast and pelvic exam.  1. Postmenopausal/atrophic genital changes. Without significant hot flushes, night sweats, vaginal dryness or any vaginal bleeding. Continue to monitor and report any vaginal bleeding. 2. Pelvic cramping this past weekend. Said it felt like "ovaries". She was having diarrhea which is now resolved as has her pain. I suspect it was GI in etiology but given her history of unable to locate her right tube and ovary at the time of surgery per Dr. Cherylann Banas will check  ultrasound to rule out pelvic pathology. Follow up for the ultrasound as well as a for recurrent pain. 3. 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 Receives annual mammography and MRI on remaining breast. Has been counseled multiple times as far as prophylactic mastectomy and declined. Had laparoscopy by Dr. Cherylann Banas with left salpingo-oophorectomy. Unable to locate right side and felt that it was removed at the time of ectopic pregnancy. Has mammogram scheduled next week. SBE monthly reviewed. 4. Pap smear 2014. No Pap smear done today. No history of significant abnormal Pap smears. Options to stop screening altogether she is over the age of 26 reviewed. Will readdress on an annual basis. 5. Osteopenia. DEXA 02/2014 T score -1.5 FRAX 4%/0.6%. Without significant change from prior DEXA. Repeat DEXA next year at 2 year interval.  Increased calcium vitamin D. 6. Colonoscopy 2014. Repeat at their recommended interval. 7. Health maintenance. No routine blood work done as patient has this done at her primary physician's office. Follow up ultrasound otherwise annually. 8.    [name] MD, 11:09 AM 08/09/2015

## 2015-08-09 NOTE — Patient Instructions (Signed)
Follow up for ultrasound as scheduled.  You may obtain a copy of any labs that were done today by logging onto MyChart as outlined in the instructions provided with your AVS (after visit summary). The office will not call with normal lab results but certainly if there are any significant abnormalities then we will contact you.   Health Maintenance Adopting a healthy lifestyle and getting preventive care can go a long way to promote health and wellness. Talk with your health care provider about what schedule of regular examinations is right for you. This is a good chance for you to check in with your provider about disease prevention and staying healthy. In between checkups, there are plenty of things you can do on your own. Experts have done a lot of research about which lifestyle changes and preventive measures are most likely to keep you healthy. Ask your health care provider for more information. WEIGHT AND DIET  Eat a healthy diet  Be sure to include plenty of vegetables, fruits, low-fat dairy products, and lean protein.  Do not eat a lot of foods high in solid fats, added sugars, or salt.  Get regular exercise. This is one of the most important things you can do for your health.  Most adults should exercise for at least 150 minutes each week. The exercise should increase your heart rate and make you sweat (moderate-intensity exercise).  Most adults should also do strengthening exercises at least twice a week. This is in addition to the moderate-intensity exercise.  Maintain a healthy weight  Body mass index (BMI) is a measurement that can be used to identify possible weight problems. It estimates body fat based on height and weight. Your health care provider can help determine your BMI and help you achieve or maintain a healthy weight.  For females 74 years of age and older:   A BMI below 18.5 is considered underweight.  A BMI of 18.5 to 24.9 is normal.  A BMI of 25 to 29.9 is  considered overweight.  A BMI of 30 and above is considered obese.  Watch levels of cholesterol and blood lipids  You should start having your blood tested for lipids and cholesterol at 71 years of age, then have this test every 5 years.  You may need to have your cholesterol levels checked more often if:  Your lipid or cholesterol levels are high.  You are older than 71 years of age.  You are at high risk for heart disease.  CANCER SCREENING   Lung Cancer  Lung cancer screening is recommended for adults 24-9 years old who are at high risk for lung cancer because of a history of smoking.  A yearly low-dose CT scan of the lungs is recommended for people who:  Currently smoke.  Have quit within the past 15 years.  Have at least a 30-pack-year history of smoking. A pack year is smoking an average of one pack of cigarettes a day for 1 year.  Yearly screening should continue until it has been 15 years since you quit.  Yearly screening should stop if you develop a health problem that would prevent you from having lung cancer treatment.  Breast Cancer  Practice breast self-awareness. This means understanding how your breasts normally appear and feel.  It also means doing regular breast self-exams. Let your health care provider know about any changes, no matter how small.  If you are in your 20s or 30s, you should have a clinical breast exam (CBE)  by a health care provider every 1-3 years as part of a regular health exam.  If you are 22 or older, have a CBE every year. Also consider having a breast X-ray (mammogram) every year.  If you have a family history of breast cancer, talk to your health care provider about genetic screening.  If you are at high risk for breast cancer, talk to your health care provider about having an MRI and a mammogram every year.  Breast cancer gene (BRCA) assessment is recommended for women who have family members with BRCA-related cancers.  BRCA-related cancers include:  Breast.  Ovarian.  Tubal.  Peritoneal cancers.  Results of the assessment will determine the need for genetic counseling and BRCA1 and BRCA2 testing. Cervical Cancer Routine pelvic examinations to screen for cervical cancer are no longer recommended for nonpregnant women who are considered low risk for cancer of the pelvic organs (ovaries, uterus, and vagina) and who do not have symptoms. A pelvic examination may be necessary if you have symptoms including those associated with pelvic infections. Ask your health care provider if a screening pelvic exam is right for you.   The Pap test is the screening test for cervical cancer for women who are considered at risk.  If you had a hysterectomy for a problem that was not cancer or a condition that could lead to cancer, then you no longer need Pap tests.  If you are older than 65 years, and you have had normal Pap tests for the past 10 years, you no longer need to have Pap tests.  If you have had past treatment for cervical cancer or a condition that could lead to cancer, you need Pap tests and screening for cancer for at least 20 years after your treatment.  If you no longer get a Pap test, assess your risk factors if they change (such as having a new sexual partner). This can affect whether you should start being screened again.  Some women have medical problems that increase their chance of getting cervical cancer. If this is the case for you, your health care provider may recommend more frequent screening and Pap tests.  The human papillomavirus (HPV) test is another test that may be used for cervical cancer screening. The HPV test looks for the virus that can cause cell changes in the cervix. The cells collected during the Pap test can be tested for HPV.  The HPV test can be used to screen women 39 years of age and older. Getting tested for HPV can extend the interval between normal Pap tests from three to  five years.  An HPV test also should be used to screen women of any age who have unclear Pap test results.  After 71 years of age, women should have HPV testing as often as Pap tests.  Colorectal Cancer  This type of cancer can be detected and often prevented.  Routine colorectal cancer screening usually begins at 71 years of age and continues through 71 years of age.  Your health care provider may recommend screening at an earlier age if you have risk factors for colon cancer.  Your health care provider may also recommend using home test kits to check for hidden blood in the stool.  A small camera at the end of a tube can be used to examine your colon directly (sigmoidoscopy or colonoscopy). This is done to check for the earliest forms of colorectal cancer.  Routine screening usually begins at age 58.  Direct  examination of the colon should be repeated every 5-10 years through 71 years of age. However, you may need to be screened more often if early forms of precancerous polyps or small growths are found. Skin Cancer  Check your skin from head to toe regularly.  Tell your health care provider about any new moles or changes in moles, especially if there is a change in a mole's shape or color.  Also tell your health care provider if you have a mole that is larger than the size of a pencil eraser.  Always use sunscreen. Apply sunscreen liberally and repeatedly throughout the day.  Protect yourself by wearing long sleeves, pants, a wide-brimmed hat, and sunglasses whenever you are outside. HEART DISEASE, DIABETES, AND HIGH BLOOD PRESSURE   Have your blood pressure checked at least every 1-2 years. High blood pressure causes heart disease and increases the risk of stroke.  If you are between 26 years and 34 years old, ask your health care provider if you should take aspirin to prevent strokes.  Have regular diabetes screenings. This involves taking a blood sample to check your  fasting blood sugar level.  If you are at a normal weight and have a low risk for diabetes, have this test once every three years after 71 years of age.  If you are overweight and have a high risk for diabetes, consider being tested at a younger age or more often. PREVENTING INFECTION  Hepatitis B  If you have a higher risk for hepatitis B, you should be screened for this virus. You are considered at high risk for hepatitis B if:  You were born in a country where hepatitis B is common. Ask your health care provider which countries are considered high risk.  Your parents were born in a high-risk country, and you have not been immunized against hepatitis B (hepatitis B vaccine).  You have HIV or AIDS.  You use needles to inject street drugs.  You live with someone who has hepatitis B.  You have had sex with someone who has hepatitis B.  You get hemodialysis treatment.  You take certain medicines for conditions, including cancer, organ transplantation, and autoimmune conditions. Hepatitis C  Blood testing is recommended for:  Everyone born from [birth year] through 1967.  Anyone with known risk factors for hepatitis C. Sexually transmitted infections (STIs)  You should be screened for sexually transmitted infections (STIs) including gonorrhea and chlamydia if:  You are sexually active and are younger than 71 years of age.  You are older than 71 years of age and your health care provider tells you that you are at risk for this type of infection.  Your sexual activity has changed since you were last screened and you are at an increased risk for chlamydia or gonorrhea. Ask your health care provider if you are at risk.  If you do not have HIV, but are at risk, it may be recommended that you take a prescription medicine daily to prevent HIV infection. This is called pre-exposure prophylaxis (PrEP). You are considered at risk if:  You are sexually active and do not regularly use condoms or  know the HIV status of your partner(s).  You take drugs by injection.  You are sexually active with a partner who has HIV. Talk with your health care provider about whether you are at high risk of being infected with HIV. If you choose to begin PrEP, you should first be tested for HIV. You should then be tested  every 3 months for as long as you are taking PrEP.  PREGNANCY   If you are premenopausal and you may become pregnant, ask your health care provider about preconception counseling.  If you may become pregnant, take 400 to 800 micrograms (mcg) of folic acid every day.  If you want to prevent pregnancy, talk to your health care provider about birth control (contraception). OSTEOPOROSIS AND MENOPAUSE   Osteoporosis is a disease in which the bones lose minerals and strength with aging. This can result in serious bone fractures. Your risk for osteoporosis can be identified using a bone density scan.  If you are 67 years of age or older, or if you are at risk for osteoporosis and fractures, ask your health care provider if you should be screened.  Ask your health care provider whether you should take a calcium or vitamin D supplement to lower your risk for osteoporosis.  Menopause may have certain physical symptoms and risks.  Hormone replacement therapy may reduce some of these symptoms and risks. Talk to your health care provider about whether hormone replacement therapy is right for you.  HOME CARE INSTRUCTIONS   Schedule regular health, dental, and eye exams.  Stay current with your immunizations.   Do not use any tobacco products including cigarettes, chewing tobacco, or electronic cigarettes.  If you are pregnant, do not drink alcohol.  If you are breastfeeding, limit how much and how often you drink alcohol.  Limit alcohol intake to no more than 1 drink per day for nonpregnant women. One drink equals 12 ounces of beer, 5 ounces of wine, or 1 ounces of hard liquor.  Do  not use street drugs.  Do not share needles.  Ask your health care provider for help if you need support or information about quitting drugs.  Tell your health care provider if you often feel depressed.  Tell your health care provider if you have ever been abused or do not feel safe at home. Document Released: 05/26/2011 Document Revised: 03/27/2014 Document Reviewed: 10/12/2013 [facility] Patient Information 2015 [place], [place]. This information is not intended to replace advice given to you by your health care provider. Make sure you discuss any questions you have with your health care provider.

## 2015-08-10 LAB — URINALYSIS W MICROSCOPIC + REFLEX CULTURE
Bacteria, UA: NONE SEEN [HPF]
Bilirubin Urine: NEGATIVE
Casts: NONE SEEN [LPF]
Crystals: NONE SEEN [HPF]
GLUCOSE, UA: NEGATIVE
Hgb urine dipstick: NEGATIVE
Ketones, ur: NEGATIVE
LEUKOCYTES UA: NEGATIVE
NITRITE: NEGATIVE
PH: 6.5 (ref 5.0–8.0)
Protein, ur: NEGATIVE
SPECIFIC GRAVITY, URINE: 1.03 (ref 1.001–1.035)
WBC UA: NONE SEEN WBC/HPF (ref <=5)
Yeast: NONE SEEN [HPF]

## 2015-08-13 ENCOUNTER — Other Ambulatory Visit: Payer: Medicare Other

## 2015-08-13 ENCOUNTER — Other Ambulatory Visit: Payer: Self-pay | Admitting: Gynecology

## 2015-08-13 DIAGNOSIS — R8271 Bacteriuria: Secondary | ICD-10-CM

## 2015-08-13 LAB — URINE CULTURE

## 2015-08-14 LAB — URINALYSIS W MICROSCOPIC + REFLEX CULTURE
Bacteria, UA: NONE SEEN [HPF]
Bilirubin Urine: NEGATIVE
CASTS: NONE SEEN [LPF]
Crystals: NONE SEEN [HPF]
Glucose, UA: NEGATIVE
Hgb urine dipstick: NEGATIVE
Ketones, ur: NEGATIVE
LEUKOCYTES UA: NEGATIVE
NITRITE: NEGATIVE
PH: 6 (ref 5.0–8.0)
Protein, ur: NEGATIVE
RBC / HPF: NONE SEEN RBC/HPF (ref <=2)
SPECIFIC GRAVITY, URINE: 1.008 (ref 1.001–1.035)
Squamous Epithelial / LPF: NONE SEEN [HPF] (ref <=5)
WBC, UA: NONE SEEN WBC/HPF (ref <=5)
Yeast: NONE SEEN [HPF]

## 2015-08-16 DIAGNOSIS — Z1231 Encounter for screening mammogram for malignant neoplasm of breast: Secondary | ICD-10-CM | POA: Diagnosis not present

## 2015-08-17 ENCOUNTER — Encounter: Payer: Self-pay | Admitting: Gynecology

## 2015-08-24 ENCOUNTER — Encounter: Payer: Self-pay | Admitting: Gynecology

## 2015-08-24 ENCOUNTER — Ambulatory Visit (INDEPENDENT_AMBULATORY_CARE_PROVIDER_SITE_OTHER): Payer: Medicare Other

## 2015-08-24 ENCOUNTER — Ambulatory Visit (INDEPENDENT_AMBULATORY_CARE_PROVIDER_SITE_OTHER): Payer: Medicare Other | Admitting: Gynecology

## 2015-08-24 ENCOUNTER — Other Ambulatory Visit: Payer: Self-pay | Admitting: Gynecology

## 2015-08-24 VITALS — BP 122/80

## 2015-08-24 DIAGNOSIS — D251 Intramural leiomyoma of uterus: Secondary | ICD-10-CM

## 2015-08-24 DIAGNOSIS — N8333 Acquired atrophy of ovary and fallopian tube: Secondary | ICD-10-CM | POA: Diagnosis not present

## 2015-08-24 DIAGNOSIS — R102 Pelvic and perineal pain: Secondary | ICD-10-CM

## 2015-08-24 DIAGNOSIS — N83339 Acquired atrophy of ovary and fallopian tube, unspecified side: Secondary | ICD-10-CM

## 2015-08-24 NOTE — Patient Instructions (Signed)
Followup in one year for annual exam, sooner if any issues 

## 2015-08-24 NOTE — Progress Notes (Signed)
[name] 07/11/1946 [id number]        69 y.o.  G2P0020 Presents for follow up ultrasound. Having some pelvic cramping that we thought was GI but wanted to rule out GYN etiology. She has a history of left salpingo-oophorectomy due to BRCA2 positive screening. Unable to find right fallopian tube or ovary at the time of surgery by Dr. Gottsegen.  Past medical history,surgical history, problem list, medications, allergies, family history and social history were all reviewed and documented in the EPIC chart.  Directed ROS with pertinent positives and negatives documented in the history of present illness/assessment and plan.  Exam: Filed Vitals:   08/24/15 1159  BP: 122/80   General appearance:  Normal  Ultrasound shows uterus normal size and echotexture. Small5 mm myoma noted. Endometrial echo measuring 3.5 mm noting fluid filled cavity. Right ovary apparently visualized although difficult to see. Measuring 16.8 mm x 7.7 mm. No cystic changes. Left adnexa normal without apparent mass or abnormalities. Cul-de-sac negative  Assessment/Plan:  69 y.o. G2P0020 with history as above. Ultrasound is negative. Questionable identification of right ovary although small solid in appearance, possibly scar tissue. Issues of whether we should consider repeating surgery to identify and remove if possible versus continue to follow. Given that she did have a laparoscopy with a good assessment of the pelvis and no ovary found I feel at this point we'll plan expectant management as there could be other etiologies for this area seen on ultrasound and this point we'll just wait and monitor. Will recommend repeating ultrasound next year just to relook at this area.  Patient comfortable with the plan.    FONTAINE,TIMOTHY P MD, 12:54 PM 08/24/2015     

## 2015-08-27 DIAGNOSIS — H401112 Primary open-angle glaucoma, right eye, moderate stage: Secondary | ICD-10-CM | POA: Diagnosis not present

## 2015-08-27 DIAGNOSIS — H04129 Dry eye syndrome of unspecified lacrimal gland: Secondary | ICD-10-CM | POA: Diagnosis not present

## 2015-08-27 DIAGNOSIS — H21511 Anterior synechiae (iris), right eye: Secondary | ICD-10-CM | POA: Diagnosis not present

## 2015-09-03 ENCOUNTER — Encounter: Payer: Self-pay | Admitting: *Deleted

## 2015-09-03 NOTE — Progress Notes (Signed)
Received mammo report from [facility], sent to scan. 

## 2015-09-27 DIAGNOSIS — H401113 Primary open-angle glaucoma, right eye, severe stage: Secondary | ICD-10-CM | POA: Diagnosis not present

## 2015-09-27 DIAGNOSIS — H25012 Cortical age-related cataract, left eye: Secondary | ICD-10-CM | POA: Diagnosis not present

## 2015-09-27 DIAGNOSIS — H401122 Primary open-angle glaucoma, left eye, moderate stage: Secondary | ICD-10-CM | POA: Diagnosis not present

## 2015-09-27 DIAGNOSIS — H2512 Age-related nuclear cataract, left eye: Secondary | ICD-10-CM | POA: Diagnosis not present

## 2015-10-02 DIAGNOSIS — M859 Disorder of bone density and structure, unspecified: Secondary | ICD-10-CM | POA: Diagnosis not present

## 2015-10-02 DIAGNOSIS — H401113 Primary open-angle glaucoma, right eye, severe stage: Secondary | ICD-10-CM | POA: Diagnosis not present

## 2015-10-02 DIAGNOSIS — H401122 Primary open-angle glaucoma, left eye, moderate stage: Secondary | ICD-10-CM | POA: Diagnosis not present

## 2015-10-02 DIAGNOSIS — I10 Essential (primary) hypertension: Secondary | ICD-10-CM | POA: Diagnosis not present

## 2015-10-02 DIAGNOSIS — E784 Other hyperlipidemia: Secondary | ICD-10-CM | POA: Diagnosis not present

## 2015-10-09 DIAGNOSIS — Z0000 Encounter for general adult medical examination without abnormal findings: Secondary | ICD-10-CM | POA: Diagnosis not present

## 2015-10-09 DIAGNOSIS — M859 Disorder of bone density and structure, unspecified: Secondary | ICD-10-CM | POA: Diagnosis not present

## 2015-10-09 DIAGNOSIS — C50919 Malignant neoplasm of unspecified site of unspecified female breast: Secondary | ICD-10-CM | POA: Diagnosis not present

## 2015-10-09 DIAGNOSIS — Z1389 Encounter for screening for other disorder: Secondary | ICD-10-CM | POA: Diagnosis not present

## 2015-10-09 DIAGNOSIS — E7800 Pure hypercholesterolemia, unspecified: Secondary | ICD-10-CM | POA: Diagnosis not present

## 2015-10-09 DIAGNOSIS — H4089 Other specified glaucoma: Secondary | ICD-10-CM | POA: Diagnosis not present

## 2015-10-09 DIAGNOSIS — Z6822 Body mass index (BMI) 22.0-22.9, adult: Secondary | ICD-10-CM | POA: Diagnosis not present

## 2015-10-09 DIAGNOSIS — Z23 Encounter for immunization: Secondary | ICD-10-CM | POA: Diagnosis not present

## 2015-10-09 DIAGNOSIS — I10 Essential (primary) hypertension: Secondary | ICD-10-CM | POA: Diagnosis not present

## 2015-10-09 DIAGNOSIS — F321 Major depressive disorder, single episode, moderate: Secondary | ICD-10-CM | POA: Diagnosis not present

## 2015-10-16 DIAGNOSIS — Z1212 Encounter for screening for malignant neoplasm of rectum: Secondary | ICD-10-CM | POA: Diagnosis not present

## 2015-10-16 DIAGNOSIS — H401122 Primary open-angle glaucoma, left eye, moderate stage: Secondary | ICD-10-CM | POA: Diagnosis not present

## 2015-10-16 DIAGNOSIS — H401113 Primary open-angle glaucoma, right eye, severe stage: Secondary | ICD-10-CM | POA: Diagnosis not present

## 2015-10-26 ENCOUNTER — Ambulatory Visit (HOSPITAL_BASED_OUTPATIENT_CLINIC_OR_DEPARTMENT_OTHER): Payer: Medicare Other | Admitting: Hematology and Oncology

## 2015-10-26 ENCOUNTER — Telehealth: Payer: Self-pay | Admitting: Hematology and Oncology

## 2015-10-26 ENCOUNTER — Other Ambulatory Visit (HOSPITAL_BASED_OUTPATIENT_CLINIC_OR_DEPARTMENT_OTHER): Payer: Medicare Other

## 2015-10-26 ENCOUNTER — Encounter: Payer: Self-pay | Admitting: Hematology and Oncology

## 2015-10-26 ENCOUNTER — Other Ambulatory Visit: Payer: Self-pay

## 2015-10-26 VITALS — BP 123/61 | HR 71 | Temp 97.6°F | Resp 18 | Ht 64.0 in | Wt 126.4 lb

## 2015-10-26 DIAGNOSIS — Z1509 Genetic susceptibility to other malignant neoplasm: Secondary | ICD-10-CM

## 2015-10-26 DIAGNOSIS — C50312 Malignant neoplasm of lower-inner quadrant of left female breast: Secondary | ICD-10-CM

## 2015-10-26 DIAGNOSIS — Z853 Personal history of malignant neoplasm of breast: Secondary | ICD-10-CM | POA: Diagnosis not present

## 2015-10-26 DIAGNOSIS — Z1501 Genetic susceptibility to malignant neoplasm of breast: Secondary | ICD-10-CM

## 2015-10-26 LAB — CBC WITH DIFFERENTIAL/PLATELET
BASO%: 0.7 % (ref 0.0–2.0)
Basophils Absolute: 0 10*3/uL (ref 0.0–0.1)
EOS ABS: 0.1 10*3/uL (ref 0.0–0.5)
EOS%: 1.7 % (ref 0.0–7.0)
HEMATOCRIT: 37.6 % (ref 34.8–46.6)
HGB: 12.2 g/dL (ref 11.6–15.9)
LYMPH%: 37.4 % (ref 14.0–49.7)
MCH: 29.6 pg (ref 25.1–34.0)
MCHC: 32.5 g/dL (ref 31.5–36.0)
MCV: 91.2 fL (ref 79.5–101.0)
MONO#: 0.5 10*3/uL (ref 0.1–0.9)
MONO%: 8.5 % (ref 0.0–14.0)
NEUT#: 3.3 10*3/uL (ref 1.5–6.5)
NEUT%: 51.7 % (ref 38.4–76.8)
PLATELETS: 207 10*3/uL (ref 145–400)
RBC: 4.13 10*6/uL (ref 3.70–5.45)
RDW: 15 % — AB (ref 11.2–14.5)
WBC: 6.4 10*3/uL (ref 3.9–10.3)
lymph#: 2.4 10*3/uL (ref 0.9–3.3)

## 2015-10-26 LAB — COMPREHENSIVE METABOLIC PANEL
ALT: 16 U/L (ref 0–55)
ANION GAP: 10 meq/L (ref 3–11)
AST: 20 U/L (ref 5–34)
Albumin: 4 g/dL (ref 3.5–5.0)
Alkaline Phosphatase: 47 U/L (ref 40–150)
BUN: 11.8 mg/dL (ref 7.0–26.0)
CALCIUM: 9.4 mg/dL (ref 8.4–10.4)
CHLORIDE: 108 meq/L (ref 98–109)
CO2: 25 mEq/L (ref 22–29)
CREATININE: 0.8 mg/dL (ref 0.6–1.1)
EGFR: 84 mL/min/{1.73_m2} — AB (ref >90)
Glucose: 94 mg/dl (ref 70–140)
Potassium: 3.7 mEq/L (ref 3.5–5.1)
Sodium: 143 mEq/L (ref 136–145)
Total Bilirubin: 0.81 mg/dL (ref 0.20–1.20)
Total Protein: 7.5 g/dL (ref 6.4–8.3)

## 2015-10-26 NOTE — Assessment & Plan Note (Signed)
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 Breast exam 10/26/2015: Normal 2. Mammogram 08/14/2015 right breast No abnormalities. Breast Density Category C. I recommended that she get 3-D mammograms for surveillance. 3. Breast MRI December 2015:  normal patient will need a breast MRI annually because of her BRCA mutation   Return to clinic in 1 year for annual follow-ups

## 2015-10-26 NOTE — Addendum Note (Signed)
Addended by: Prentiss Bells on: 10/26/2015 06:32 PM   Modules accepted: Medications

## 2015-10-26 NOTE — Telephone Encounter (Signed)
Appointments made and avs printed for patient °

## 2015-10-26 NOTE — Progress Notes (Signed)
Patient Care Team: Haywood Pao, MD as PCP - General (Internal Medicine)  DIAGNOSIS: No matching staging information was found for the patient.  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 lymph node with Micrometastases ER 99%, PR 0%, Ki-67 45%, HER-2 2+ fish negative   10/14/2005 - 01/23/2006 Chemotherapy Adjuvant chemotherapy with 6 cycles of FEC   02/23/2006 - 10/25/2013 Anti-estrogen oral therapy Letrozole 2.5 mg daily changed to tamoxifen due to pain but she did not take this switched to raloxifene stopped 2014   09/04/2013 Surgery Bilateral salpingo-oophorectomy (genetic testing revealed BRCA2 gene mutation positivity)    CHIEF COMPLIANT: Annual follow-up of left breast cancer with BRCA2 mutation  INTERVAL HISTORY: [name] is a 69 year old with above-mentioned history of left breast cancer treated with mastectomy in 2006 followed by adjuvant chemotherapy and antiestrogen therapy that was discontinued in 2014 by patient preference. She also underwent bilateral salpingo-oophorectomy because of BRCA2 mutation. She is here for annual follow-up and reports that she is doing quite well. One of her sisters was diagnosed with recurrent breast cancer.  REVIEW OF SYSTEMS:   Constitutional: Denies fevers, chills or abnormal weight loss Eyes: Denies blurriness of vision Ears, nose, mouth, throat, and face: Denies mucositis or sore throat Respiratory: Denies cough, dyspnea or wheezes Cardiovascular: Denies palpitation, chest discomfort or lower extremity swelling Gastrointestinal:  Denies nausea, heartburn or change in bowel habits Skin: Denies abnormal skin rashes Lymphatics: Denies new lymphadenopathy or easy bruising Neurological:Denies numbness, tingling or new weaknesses Behavioral/Psych: Mood is  stable, no new changes  Breast:  denies any pain or lumps or nodules in either breasts All other systems were reviewed with the patient and are negative.  I have reviewed the past medical history, past surgical history, social history and family history with the patient and they are unchanged from previous note.  ALLERGIES:  has No Known Allergies.  MEDICATIONS:  Current Outpatient Prescriptions  Medication Sig Dispense Refill  . atorvastatin (LIPITOR) 20 MG tablet     . Brimonidine Tartrate (ALPHAGAN P OP) Apply to eye 3 (three) times daily.     . Calcium Carbonate-Vit D-Min (CALTRATE 600+D PLUS PO) Take 1 tablet by mouth 2 (two) times daily.     . Cholecalciferol (VITAMIN D) 1000 UNITS capsule Take 1,000 Units by mouth daily.     . dorzolamide-timolol (COSOPT) 22.3-6.8 MG/ML ophthalmic solution Place 1 drop into both eyes 2 (two) times daily.     Marland Kitchen latanoprost (XALATAN) 0.005 % ophthalmic solution Place 1 drop into both eyes at bedtime.     Marland Kitchen losartan-hydrochlorothiazide (HYZAAR) 50-12.5 MG per tablet Take 1 tablet by mouth every morning.     . zolpidem (AMBIEN) 5 MG tablet Take 5 mg by mouth at bedtime as needed.      No current facility-administered medications for this visit.    PHYSICAL EXAMINATION: ECOG PERFORMANCE STATUS: 0 - Asymptomatic  Filed Vitals:   10/26/15 1108  BP: 123/61  Pulse: 71  Temp: 97.6 F (36.4 C)  Resp: 18   Filed Weights   10/26/15 1108  Weight: 126 lb 6.4 oz (57.335 kg)    GENERAL:alert, no distress and comfortable SKIN: skin color, texture, turgor are normal, no rashes or significant lesions EYES: normal, Conjunctiva are pink and non-injected, sclera clear OROPHARYNX:no exudate, no erythema and lips, buccal mucosa,  and tongue normal  NECK: supple, thyroid normal size, non-tender, without nodularity LYMPH:  no palpable lymphadenopathy in the cervical, axillary or inguinal LUNGS: clear to auscultation and percussion with normal breathing  effort HEART: regular rate & rhythm and no murmurs and no lower extremity edema ABDOMEN:abdomen soft, non-tender and normal bowel sounds Musculoskeletal:no cyanosis of digits and no clubbing  NEURO: alert & oriented x 3 with fluent speech, no focal motor/sensory deficits BREAST: No palpable masses or nodules in the right breast. Left axilla and chest wall are intact without any lumps or nodules. (exam performed in the presence of a chaperone)  LABORATORY DATA:  I have reviewed the data as listed   Chemistry      Component Value Date/Time   NA 143 10/26/2015 1103   NA 141 11/03/2012 1407   K 3.7 10/26/2015 1103   K 3.5 11/03/2012 1407   CL 110* 04/08/2013 1112   CL 105 11/03/2012 1407   CO2 25 10/26/2015 1103   CO2 27 11/03/2012 1407   BUN 11.8 10/26/2015 1103   BUN 13 11/03/2012 1407   CREATININE 0.8 10/26/2015 1103   CREATININE 0.79 11/03/2012 1407      Component Value Date/Time   CALCIUM 9.4 10/26/2015 1103   CALCIUM 9.6 11/03/2012 1407   ALKPHOS 47 10/26/2015 1103   ALKPHOS 57 02/12/2012 1121   AST 20 10/26/2015 1103   AST 24 02/12/2012 1121   ALT 16 10/26/2015 1103   ALT 28 02/12/2012 1121   BILITOT 0.81 10/26/2015 1103   BILITOT 0.6 02/12/2012 1121       Lab Results  Component Value Date   WBC 6.4 10/26/2015   HGB 12.2 10/26/2015   HCT 37.6 10/26/2015   MCV 91.2 10/26/2015   PLT 207 10/26/2015   NEUTROABS 3.3 10/26/2015    ASSESSMENT & PLAN:  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 Breast exam 10/26/2015: Normal 2. Mammogram 08/14/2015 right breast No abnormalities. Breast Density Category C. I recommended that she get 3-D mammograms for  surveillance. 3. Breast MRI December 2015:  normal patient will need a breast MRI annually because of her BRCA mutation   Return to clinic in 1 year for annual follow-ups  Orders Placed This Encounter  Procedures  . MR Breast Right W Wo Contrast    Standing Status: Future     Number of Occurrences:      Standing Expiration Date: 12/26/2016    Order Specific Question:  If indicated for the ordered procedure, I authorize the administration of contrast media per Radiology protocol    Answer:  Yes    Order Specific Question:  Reason for Exam (SYMPTOM  OR DIAGNOSIS REQUIRED)    Answer:  BRCA mutation. Annual eval    Order Specific Question:  Preferred imaging location?    Answer:  [id number]    Order Specific Question:  Does the patient have a pacemaker or implanted devices?    Answer:  No    Order Specific Question:  What is the patient's sedation requirement?    Answer:  No Sedation   The patient has a good understanding of the overall plan. she agrees with it. she will call with any problems that may develop before  the next visit here.   Rulon Eisenmenger, MD 10/26/2015

## 2015-10-30 ENCOUNTER — Other Ambulatory Visit: Payer: Self-pay | Admitting: Hematology and Oncology

## 2015-11-15 ENCOUNTER — Ambulatory Visit
Admission: RE | Admit: 2015-11-15 | Discharge: 2015-11-15 | Disposition: A | Discharge status: Home / Self Care | Payer: Medicare Other | Source: Ambulatory Visit | Attending: Hematology and Oncology | Admitting: Hematology and Oncology

## 2015-11-15 DIAGNOSIS — C50312 Malignant neoplasm of lower-inner quadrant of left female breast: Secondary | ICD-10-CM

## 2015-11-15 DIAGNOSIS — Z853 Personal history of malignant neoplasm of breast: Secondary | ICD-10-CM | POA: Diagnosis not present

## 2015-11-15 MED ORDER — GADOBENATE DIMEGLUMINE 529 MG/ML IV SOLN
13.0000 mL | Freq: Once | INTRAVENOUS | Status: AC | PRN
  Administered 2015-11-15: 13 mL via INTRAVENOUS

## 2015-12-12 DIAGNOSIS — Z6821 Body mass index (BMI) 21.0-21.9, adult: Secondary | ICD-10-CM | POA: Diagnosis not present

## 2015-12-12 DIAGNOSIS — Z1389 Encounter for screening for other disorder: Secondary | ICD-10-CM | POA: Diagnosis not present

## 2015-12-12 DIAGNOSIS — F321 Major depressive disorder, single episode, moderate: Secondary | ICD-10-CM | POA: Diagnosis not present

## 2015-12-19 DIAGNOSIS — H401114 Primary open-angle glaucoma, right eye, indeterminate stage: Secondary | ICD-10-CM | POA: Diagnosis not present

## 2015-12-19 DIAGNOSIS — H401124 Primary open-angle glaucoma, left eye, indeterminate stage: Secondary | ICD-10-CM | POA: Diagnosis not present

## 2015-12-19 DIAGNOSIS — H2512 Age-related nuclear cataract, left eye: Secondary | ICD-10-CM | POA: Diagnosis not present

## 2016-02-19 DIAGNOSIS — Z0000 Encounter for general adult medical examination without abnormal findings: Secondary | ICD-10-CM | POA: Diagnosis not present

## 2016-02-19 DIAGNOSIS — R3915 Urgency of urination: Secondary | ICD-10-CM | POA: Diagnosis not present

## 2016-02-23 DIAGNOSIS — M8580 Other specified disorders of bone density and structure, unspecified site: Secondary | ICD-10-CM

## 2016-02-23 HISTORY — DX: Other specified disorders of bone density and structure, unspecified site: M85.80

## 2016-03-05 DIAGNOSIS — H401122 Primary open-angle glaucoma, left eye, moderate stage: Secondary | ICD-10-CM | POA: Diagnosis not present

## 2016-03-18 DIAGNOSIS — M8589 Other specified disorders of bone density and structure, multiple sites: Secondary | ICD-10-CM | POA: Diagnosis not present

## 2016-03-24 ENCOUNTER — Encounter: Payer: Self-pay | Admitting: Gynecology

## 2016-03-31 ENCOUNTER — Encounter: Payer: Self-pay | Admitting: Gynecology

## 2016-04-08 DIAGNOSIS — F321 Major depressive disorder, single episode, moderate: Secondary | ICD-10-CM | POA: Diagnosis not present

## 2016-04-08 DIAGNOSIS — N3010 Interstitial cystitis (chronic) without hematuria: Secondary | ICD-10-CM | POA: Diagnosis not present

## 2016-04-08 DIAGNOSIS — Z6823 Body mass index (BMI) 23.0-23.9, adult: Secondary | ICD-10-CM | POA: Diagnosis not present

## 2016-04-08 DIAGNOSIS — M859 Disorder of bone density and structure, unspecified: Secondary | ICD-10-CM | POA: Diagnosis not present

## 2016-04-08 DIAGNOSIS — C50919 Malignant neoplasm of unspecified site of unspecified female breast: Secondary | ICD-10-CM | POA: Diagnosis not present

## 2016-04-08 DIAGNOSIS — E7800 Pure hypercholesterolemia, unspecified: Secondary | ICD-10-CM | POA: Diagnosis not present

## 2016-04-08 DIAGNOSIS — G4700 Insomnia, unspecified: Secondary | ICD-10-CM | POA: Diagnosis not present

## 2016-04-08 DIAGNOSIS — H409 Unspecified glaucoma: Secondary | ICD-10-CM | POA: Diagnosis not present

## 2016-04-08 DIAGNOSIS — I10 Essential (primary) hypertension: Secondary | ICD-10-CM | POA: Diagnosis not present

## 2016-06-02 DIAGNOSIS — H401112 Primary open-angle glaucoma, right eye, moderate stage: Secondary | ICD-10-CM | POA: Diagnosis not present

## 2016-06-02 DIAGNOSIS — H401122 Primary open-angle glaucoma, left eye, moderate stage: Secondary | ICD-10-CM | POA: Diagnosis not present

## 2016-06-11 ENCOUNTER — Ambulatory Visit (INDEPENDENT_AMBULATORY_CARE_PROVIDER_SITE_OTHER): Payer: Medicare Other | Admitting: Gynecology

## 2016-06-11 ENCOUNTER — Encounter: Payer: Self-pay | Admitting: Gynecology

## 2016-06-11 VITALS — BP 142/90

## 2016-06-11 DIAGNOSIS — R350 Frequency of micturition: Secondary | ICD-10-CM | POA: Diagnosis not present

## 2016-06-11 DIAGNOSIS — N949 Unspecified condition associated with female genital organs and menstrual cycle: Secondary | ICD-10-CM

## 2016-06-11 DIAGNOSIS — N9489 Other specified conditions associated with female genital organs and menstrual cycle: Secondary | ICD-10-CM

## 2016-06-11 DIAGNOSIS — L292 Pruritus vulvae: Secondary | ICD-10-CM | POA: Diagnosis not present

## 2016-06-11 LAB — URINALYSIS W MICROSCOPIC + REFLEX CULTURE
BILIRUBIN URINE: NEGATIVE
Bacteria, UA: NONE SEEN [HPF]
CASTS: NONE SEEN [LPF]
Crystals: NONE SEEN [HPF]
GLUCOSE, UA: NEGATIVE
KETONES UR: NEGATIVE
Leukocytes, UA: NEGATIVE
Nitrite: NEGATIVE
PROTEIN: NEGATIVE
Specific Gravity, Urine: 1.02 (ref 1.001–1.035)
WBC, UA: NONE SEEN WBC/HPF (ref <=5)
Yeast: NONE SEEN [HPF]
pH: 5.5 (ref 5.0–8.0)

## 2016-06-11 LAB — WET PREP FOR TRICH, YEAST, CLUE
Clue Cells Wet Prep HPF POC: NONE SEEN
Trich, Wet Prep: NONE SEEN

## 2016-06-11 MED ORDER — FLUCONAZOLE 150 MG PO TABS: 150.0000 mg | ORAL_TABLET | Freq: Once | ORAL | Status: DC

## 2016-06-11 MED ORDER — TINIDAZOLE 500 MG PO TABS: ORAL_TABLET | ORAL | Status: DC

## 2016-06-11 NOTE — Patient Instructions (Signed)
Tinidazole tablets What is this medicine? TINIDAZOLE (tye NI da zole) is an antiinfective. It is used to treat amebiasis, giardiasis, trichomoniasis, and vaginosis. It will not work for colds, flu, or other viral infections. This medicine may be used for other purposes; ask your health care provider or pharmacist if you have questions. What should I tell my health care provider before I take this medicine? They need to know if you have any of these conditions: -anemia or other blood disorders -if you frequently drink alcohol containing drinks -receiving hemodialysis -seizure disorder -an unusual or allergic reaction to tinidazole, other medicines, foods, dyes, or preservatives -pregnant or trying to get pregnant -breast-feeding How should I use this medicine? Take this medicine by mouth with a full glass of water. Follow the directions on the prescription label. Take with food. Take your medicine at regular intervals. Do not take your medicine more often than directed. Take all of your medicine as directed even if you think you are better. Do not skip doses or stop your medicine early. Talk to your pediatrician regarding the use of this medicine in children. While this drug may be prescribed for children as young as 76 years of age for selected conditions, precautions do apply. Overdosage: If you think you have taken too much of this medicine contact a poison control center or emergency room at once. NOTE: This medicine is only for you. Do not share this medicine with others. What if I miss a dose? If you miss a dose, take it as soon as you can. If it is almost time for your next dose, take only that dose. Do not take double or extra doses. What may interact with this medicine? Do not take this medicine with any of the following medications: -alcohol or any product that contains alcohol -amprenavir oral solution -disulfiram -paclitaxel injection -ritonavir oral solution -sertraline oral  solution -sulfamethoxazole-trimethoprim injection This medicine may also interact with the following medications: -cholestyramine -cimetidine -conivaptan -cyclosporin -fluorouracil -fosphenytoin, phenytoin -ketoconazole -lithium -phenobarbital -tacrolimus -warfarin This list may not describe all possible interactions. Give your health care provider a list of all the medicines, herbs, non-prescription drugs, or dietary supplements you use. Also tell them if you smoke, drink alcohol, or use illegal drugs. Some items may interact with your medicine. What should I watch for while using this medicine? Tell your doctor or health care professional if your symptoms do not improve or if they get worse. Avoid alcoholic drinks while you are taking this medicine and for three days afterward. Alcohol may make you feel dizzy, sick, or flushed. If you are being treated for a sexually transmitted disease, avoid sexual contact until you have finished your treatment. Your sexual partner may also need treatment. What side effects may I notice from receiving this medicine? Side effects that you should report to your doctor or health care professional as soon as possible: -allergic reactions like skin rash, itching or hives, swelling of the face, lips, or tongue -breathing problems -confusion, depression -dark or white patches in the mouth -feeling faint or lightheaded, falls -fever, infection -numbness, tingling, pain or weakness in the hands or feet -pain when passing urine -seizures -unusually weak or tired -vaginal irritation or discharge -vomiting Side effects that usually do not require medical attention (report to your doctor or health care professional if they continue or are bothersome): -dark brown or reddish urine -diarrhea -headache -loss of appetite -metallic taste -nausea -stomach upset This list may not describe all possible side effects. Call  your doctor for medical advice about  side effects. You may report side effects to FDA at 1-800-FDA-1088. Where should I keep my medicine? Keep out of the reach of children. Store at room temperature between 15 and 30 degrees C (59 and 86 degrees F). Protect from light and moisture. Keep container tightly closed. Throw away any unused medicine after the expiration date. NOTE: This sheet is a summary. It may not cover all possible information. If you have questions about this medicine, talk to your doctor, pharmacist, or health care provider.    2016, Elsevier/Gold Standard. (2008-08-07 15:22:28) Fluconazole tablets What is this medicine? FLUCONAZOLE (floo KON na zole) is an antifungal medicine. It is used to treat certain kinds of fungal or yeast infections. This medicine may be used for other purposes; ask your health care provider or pharmacist if you have questions. What should I tell my health care provider before I take this medicine? They need to know if you have any of these conditions: -electrolyte abnormalities -history of irregular heart beat -kidney disease -an unusual or allergic reaction to fluconazole, other azole antifungals, medicines, foods, dyes, or preservatives -pregnant or trying to get pregnant -breast-feeding How should I use this medicine? Take this medicine by mouth. Follow the directions on the prescription label. Do not take your medicine more often than directed. Talk to your pediatrician regarding the use of this medicine in children. Special care may be needed. This medicine has been used in children as young as 11 months of age. Overdosage: If you think you have taken too much of this medicine contact a poison control center or emergency room at once. NOTE: This medicine is only for you. Do not share this medicine with others. What if I miss a dose? If you miss a dose, take it as soon as you can. If it is almost time for your next dose, take only that dose. Do not take double or extra doses. What  may interact with this medicine? Do not take this medicine with any of the following medications: -astemizole -certain medicines for irregular heart beat like dofetilide, dronedarone, quinidine -cisapride -erythromycin -lomitapide -other medicines that prolong the QT interval (cause an abnormal heart rhythm) -pimozide -terfenadine -thioridazine -tolvaptan -ziprasidone This medicine may also interact with the following medications: -antiviral medicines for HIV or AIDS -birth control pills -certain antibiotics like rifabutin, rifampin -certain medicines for blood pressure like amlodipine, isradipine, felodipine, hydrochlorothiazide, losartan, nifedipine -certain medicines for cancer like cyclophosphamide, vinblastine, vincristine -certain medicines for cholesterol like atorvastatin, lovastatin, fluvastatin, simvastatin -certain medicines for depression, anxiety, or psychotic disturbances like amitriptyline, midazolam, nortriptyline, triazolam -certain medicines for diabetes like glipizide, glyburide, tolbutamide -certain medicines for pain like alfentanil, fentanyl, methadone -certain medicines for seizures like carbamazepine, phenytoin -certain medicines that treat or prevent blood clots like warfarin -halofantrine -medicines that lower your chance of fighting infection like cyclosporine, prednisone, tacrolimus -NSAIDS, medicines for pain and inflammation, like celecoxib, diclofenac, flurbiprofen, ibuprofen, meloxicam, naproxen -other medicines for fungal infections -sirolimus -theophylline -tofacitinib This list may not describe all possible interactions. Give your health care provider a list of all the medicines, herbs, non-prescription drugs, or dietary supplements you use. Also tell them if you smoke, drink alcohol, or use illegal drugs. Some items may interact with your medicine. What should I watch for while using this medicine? Visit your doctor or health care professional for  regular checkups. If you are taking this medicine for a long time you may need blood work. Tell your doctor if  your symptoms do not improve. Some fungal infections need many weeks or months of treatment to cure. Alcohol can increase possible damage to your liver. Avoid alcoholic drinks. If you have a vaginal infection, do not have sex until you have finished your treatment. You can wear a sanitary napkin. Do not use tampons. Wear freshly washed cotton, not synthetic, panties. What side effects may I notice from receiving this medicine? Side effects that you should report to your doctor or health care professional as soon as possible: -allergic reactions like skin rash or itching, hives, swelling of the lips, mouth, tongue, or throat -dark urine -feeling dizzy or faint -irregular heartbeat or chest pain -redness, blistering, peeling or loosening of the skin, including inside the mouth -trouble breathing -unusual bruising or bleeding -vomiting -yellowing of the eyes or skin Side effects that usually do not require medical attention (report to your doctor or health care professional if they continue or are bothersome): -changes in how food tastes -diarrhea -headache -stomach upset or nausea This list may not describe all possible side effects. Call your doctor for medical advice about side effects. You may report side effects to FDA at 1-800-FDA-1088. Where should I keep my medicine? Keep out of the reach of children. Store at room temperature below 30 degrees C (86 degrees F). Throw away any medicine after the expiration date. NOTE: This sheet is a summary. It may not cover all possible information. If you have questions about this medicine, talk to your doctor, pharmacist, or health care provider.    2016, Elsevier/Gold Standard. (2013-06-18 16:13:04) Monilial Vaginitis Vaginitis in a soreness, swelling and redness (inflammation) of the vagina and vulva. Monilial vaginitis is not a  sexually transmitted infection. CAUSES  Yeast vaginitis is caused by yeast (candida) that is normally found in your vagina. With a yeast infection, the candida has overgrown in number to a point that upsets the chemical balance. SYMPTOMS   White, thick vaginal discharge.  Swelling, itching, redness and irritation of the vagina and possibly the lips of the vagina (vulva).  Burning or painful urination.  Painful intercourse. DIAGNOSIS  Things that may contribute to monilial vaginitis are:  Postmenopausal and virginal states.  Pregnancy.  Infections.  Being tired, sick or stressed, especially if you had monilial vaginitis in the past.  Diabetes. Good control will help lower the chance.  Birth control pills.  Tight fitting garments.  Using bubble bath, feminine sprays, douches or deodorant tampons.  Taking certain medications that kill germs (antibiotics).  Sporadic recurrence can occur if you become ill. TREATMENT  Your caregiver will give you medication.  There are several kinds of anti monilial vaginal creams and suppositories specific for monilial vaginitis. For recurrent yeast infections, use a suppository or cream in the vagina 2 times a week, or as directed.  Anti-monilial or steroid cream for the itching or irritation of the vulva may also be used. Get your caregiver's permission.  Painting the vagina with methylene blue solution may help if the monilial cream does not work.  Eating yogurt may help prevent monilial vaginitis. HOME CARE INSTRUCTIONS   Finish all medication as prescribed.  Do not have sex until treatment is completed or after your caregiver tells you it is okay.  Take warm sitz baths.  Do not douche.  Do not use tampons, especially scented ones.  Wear cotton underwear.  Avoid tight pants and panty hose.  Tell your sexual partner that you have a yeast infection. They should go to their caregiver  if they have symptoms such as mild rash or  itching.  Your sexual partner should be treated as well if your infection is difficult to eliminate.  Practice safer sex. Use condoms.  Some vaginal medications cause latex condoms to fail. Vaginal medications that harm condoms are:  Cleocin cream.  Butoconazole (Femstat).  Terconazole (Terazol) vaginal suppository.  Miconazole (Monistat) (may be purchased over the counter). SEEK MEDICAL CARE IF:   You have a temperature by mouth above 102 F (38.9 C).  The infection is getting worse after 2 days of treatment.  The infection is not getting better after 3 days of treatment.  You develop blisters in or around your vagina.  You develop vaginal bleeding, and it is not your menstrual period.  You have pain when you urinate.  You develop intestinal problems.  You have pain with sexual intercourse.   This information is not intended to replace advice given to you by your health care provider. Make sure you discuss any questions you have with your health care provider.   Document Released: 08/20/2005 Document Revised: 02/02/2012 Document Reviewed: 05/14/2015 Elsevier Interactive Patient Education [employer].

## 2016-06-11 NOTE — Progress Notes (Signed)
   HPI: patient is a 70 year old that presented to the office today complaining the past few days ofburning sensation in the vagina and irritation and vulvar pruritus and urinary frequency. She denied any dysuria per se. She denied any fever, chills, nausea, vomiting or back pain. No change in appetite and no GI complaints. Patient is sexually active but in a monogamous relationship. No change in partners.   ROS: A ROS was performed and pertinent positives and negatives are included in the history.  GENERAL: No fevers or chills. HEENT: No change in vision, no earache, sore throat or sinus congestion. NECK: No pain or stiffness. CARDIOVASCULAR: No chest pain or pressure. No palpitations. PULMONARY: No shortness of breath, cough or wheeze. GASTROINTESTINAL: No abdominal pain, nausea, vomiting or diarrhea, melena or bright red blood per rectum. GENITOURINARY: No urinary frequency, urgency, hesitancy or dysuria. MUSCULOSKELETAL: No joint or muscle pain, no back pain, no recent trauma. DERMATOLOGIC: No rash, no itching, no lesions. ENDOCRINE: No polyuria, polydipsia, no heat or cold intolerance. No recent change in weight. HEMATOLOGICAL: No anemia or easy bruising or bleeding. NEUROLOGIC: No headache, seizures, numbness, tingling or weakness. PSYCHIATRIC: No depression, no loss of interest in normal activity or change in sleep pattern.   [id number] pressure 142/90 Gen. Appearance well-developed well-nourished female with above-mentioned complaint. Back: No CVA tenderness Abdomen: Soft nontender no rebound or guarding Pelvic: Bartholin urethra Skene was within normal limits Vagina: Creamy white discharge noted Cervix: No lesions or discharge Uterus: Anteverted normal size shape and consistency Adnexa: No palatal masses or tenderness Rectal exam:Not done  Wet prep: Few yeast, few WBC moderate bacteria  Urinalysis:RBC 0-2, WBC 9 seen bacteria non-seen    Assessment Plan:patient will be treated for  bacterial vaginosis and yeast vaginitis in the following fashion: Diflucan 150 mg for yeast vaginitis. And for the bacterial vaginosis component she'll be treated with Tindamax 500 mg have 4 tablets today and for talus tomorrow.    Greater than 50% of time was spent in counseling and coordinating care of this patient.   Time of consultation: 15   Minutes.

## 2016-06-13 LAB — URINE CULTURE: ORGANISM ID, BACTERIA: NO GROWTH

## 2016-07-31 ENCOUNTER — Ambulatory Visit (INDEPENDENT_AMBULATORY_CARE_PROVIDER_SITE_OTHER): Payer: Medicare Other | Admitting: Gynecology

## 2016-07-31 VITALS — BP 118/78

## 2016-07-31 DIAGNOSIS — R351 Nocturia: Secondary | ICD-10-CM

## 2016-07-31 DIAGNOSIS — N898 Other specified noninflammatory disorders of vagina: Secondary | ICD-10-CM

## 2016-07-31 DIAGNOSIS — Z1501 Genetic susceptibility to malignant neoplasm of breast: Secondary | ICD-10-CM | POA: Diagnosis not present

## 2016-07-31 DIAGNOSIS — Z1509 Genetic susceptibility to other malignant neoplasm: Secondary | ICD-10-CM

## 2016-07-31 DIAGNOSIS — Z1502 Genetic susceptibility to malignant neoplasm of ovary: Secondary | ICD-10-CM

## 2016-07-31 LAB — WET PREP FOR TRICH, YEAST, CLUE
Trich, Wet Prep: NONE SEEN
WBC WET PREP: NONE SEEN
Yeast Wet Prep HPF POC: NONE SEEN

## 2016-07-31 LAB — URINALYSIS W MICROSCOPIC + REFLEX CULTURE
BILIRUBIN URINE: NEGATIVE
CRYSTALS: NONE SEEN [HPF]
Casts: NONE SEEN [LPF]
GLUCOSE, UA: NEGATIVE
Ketones, ur: NEGATIVE
Leukocytes, UA: NEGATIVE
Nitrite: NEGATIVE
PH: 5 (ref 5.0–8.0)
PROTEIN: NEGATIVE
Specific Gravity, Urine: 1.025 (ref 1.001–1.035)
WBC, UA: NONE SEEN WBC/HPF (ref <=5)
Yeast: NONE SEEN [HPF]

## 2016-07-31 MED ORDER — METRONIDAZOLE 500 MG PO TABS: 500.0000 mg | ORAL_TABLET | Freq: Two times a day (BID) | ORAL | 0 refills | Status: DC

## 2016-07-31 NOTE — Patient Instructions (Signed)
Take the Flagyl medication twice daily for 7 days. Follow up if the vaginal irritation continues.  Scheduled the ultrasound

## 2016-07-31 NOTE — Progress Notes (Signed)
[name] 05/13/1946 [id number]        70 y.o.  G2P0020 presents with 2 week history of vaginal irritation and discomfort. No significant discharge or odor. Also notes some nocturia getting up in the night which she normally does not do. No frequency dysuria low back pain fever or chills. No nausea vomiting diarrhea constipation  Past medical history,surgical history, problem list, medications, allergies, family history and social history were all reviewed and documented in the EPIC chart.  Directed ROS with pertinent positives and negatives documented in the history of present illness/assessment and plan.  Exam: Kim Gardner assistant Vitals:   07/31/16 1018  BP: 118/78   General appearance:  Normal Spine straight without CVA tenderness Abdomen soft nontender without masses guarding rebound Pelvic external BUS vagina with white discharge. Cervix normal. Bimanual without masses or tenderness. Adnexa without masses or tenderness  Assessment/Plan:  70 y.o. G2P0020 with vaginal irritation and discharge on exam. Wet prep consistent with bacterial vaginosis. Will treat with Flagyl 500 mg twice a day 7 days, alcohol avoidance reviewed. She also has a history of LSO because of BRCA2 positivity. At that time of laparoscopy the right adnexa was not visualized and thought to be removed when she had ectopic pregnancy. Ultrasound last year due to some pelvic discomfort showed a small area questionable right ovary 16 mm x 7 mm. No cystic changes. Recommend follow up ultrasound now to relook at this area and patient will schedule. She also has an appointment for her annual exam scheduled this coming month.    FONTAINE,TIMOTHY P MD, 10:40 AM 07/31/2016     

## 2016-07-31 NOTE — Addendum Note (Signed)
Addended by: Nelva Nay on: 07/31/2016 01:56 PM   Modules accepted: Orders

## 2016-08-01 LAB — URINE CULTURE: Organism ID, Bacteria: NO GROWTH

## 2016-08-04 ENCOUNTER — Other Ambulatory Visit: Payer: Self-pay | Admitting: Gynecology

## 2016-08-04 ENCOUNTER — Encounter: Payer: Self-pay | Admitting: Gynecology

## 2016-08-04 ENCOUNTER — Ambulatory Visit (INDEPENDENT_AMBULATORY_CARE_PROVIDER_SITE_OTHER): Payer: Medicare Other | Admitting: Gynecology

## 2016-08-04 ENCOUNTER — Ambulatory Visit (INDEPENDENT_AMBULATORY_CARE_PROVIDER_SITE_OTHER): Payer: Medicare Other

## 2016-08-04 VITALS — BP 122/76

## 2016-08-04 DIAGNOSIS — D251 Intramural leiomyoma of uterus: Secondary | ICD-10-CM

## 2016-08-04 DIAGNOSIS — Z1501 Genetic susceptibility to malignant neoplasm of breast: Secondary | ICD-10-CM

## 2016-08-04 DIAGNOSIS — Z1502 Genetic susceptibility to malignant neoplasm of ovary: Secondary | ICD-10-CM

## 2016-08-04 DIAGNOSIS — N858 Other specified noninflammatory disorders of uterus: Secondary | ICD-10-CM

## 2016-08-04 DIAGNOSIS — N9983 Residual ovary syndrome: Secondary | ICD-10-CM | POA: Diagnosis not present

## 2016-08-04 DIAGNOSIS — Z1509 Genetic susceptibility to other malignant neoplasm: Principal | ICD-10-CM

## 2016-08-04 DIAGNOSIS — N898 Other specified noninflammatory disorders of vagina: Secondary | ICD-10-CM

## 2016-08-04 MED ORDER — CLINDAMYCIN PHOSPHATE 2 % VA CREA: 1.0000 | TOPICAL_CREAM | Freq: Every day | VAGINAL | 0 refills | Status: DC

## 2016-08-04 NOTE — Patient Instructions (Signed)
Start the Cleocin vaginal cream nightly for 7 nights.  Follow up for your annual exam as scheduled

## 2016-08-04 NOTE — Progress Notes (Addendum)
    [name] 07-Aug-1946 [id number]        70 y.o.  G2P0020 follows up for ultrasound.  History of LSO for BRCA2 positivity. At the time of laparoscopy right tube and ovary was not identified and thought to have been removed when she had surgery for ectopic pregnancy. Ultrasound last year showed a small area in the right adnexa measuring 16.8 mm x 7.7 mm. Questionable ovarian remnant versus scar tissue. Was asked repeat her ultrasound now to follow up on this area.  Also was recently treated for bacterial vaginosis with Flagyl 07/31/2016. Said that her discharge and irritation was slightly better but is having a lot of bloating and discomfort from the Flagyl. No urinary symptoms such as frequency dysuria or urgency. No nausea vomiting diarrhea constipation.  Past medical history,surgical history, problem list, medications, allergies, family history and social history were all reviewed and documented in the EPIC chart.  Directed ROS with pertinent positives and negatives documented in the history of present illness/assessment and plan.  Exam: Vitals:   08/04/16 0937  BP: 122/76   General appearance:  Normal  Ultrasound shows uterus normal size. Small 8 x 8 mm myoma. Small endometrial fluid collection 12 x 3 mm. Endometrial echo 3.1 mm. Right ovarian remnant/area measured previously not seen. She does have a lot of bowel shadowing. Left adnexa is negative. Cul-de-sac negative. No gross pathology in either right or left adnexa.  Assessment/Plan:  70 y.o. G2P0020 with:  1. Questionable area in right adnexal last year on ultrasound. Not visualized now. No pathology noted in either adnexa. We'll plan expectant management. 2. Having bloating discomfort since starting Flagyl. Ultrasound does show a lot of bowel gas. Discussed options and she would prefer switching medications. Will start Cleocin vaginal cream nightly 7 nights and stop the Flagyl. She has her annual exam scheduled next week and  she'll follow up for this and we'll see how she's doing at that time.  3. Hematometria 12 x 3 mm. Ultrasound with small hematometria. Present last year and stable on ultrasound. Endometrial echo last year 3.5 mm. Endometrial echo echo this year 3.1 mm. No specific follow up required at this time. Patient knows to report any bleeding or other issues.  Greater than 50% of my time was spent in direct face to face counseling and coordination of care with the patient.     [name] MD, 10:09 AM 08/04/2016

## 2016-08-07 ENCOUNTER — Telehealth: Payer: Self-pay

## 2016-08-07 NOTE — Telephone Encounter (Signed)
Detailed message left on voice mail per [facility] access note on file.

## 2016-08-07 NOTE — Telephone Encounter (Signed)
It should be okay.

## 2016-08-07 NOTE — Telephone Encounter (Signed)
Patient called because she is using Cleocin cream prescribed on 08/04/16 and she is scheduled for her yearly exam on Monday. She said the last night on it will be Sunday night. She questioned if she should reschedule her annual exam.

## 2016-08-11 ENCOUNTER — Ambulatory Visit (INDEPENDENT_AMBULATORY_CARE_PROVIDER_SITE_OTHER): Payer: Medicare Other | Admitting: Gynecology

## 2016-08-11 ENCOUNTER — Encounter: Payer: Self-pay | Admitting: Gynecology

## 2016-08-11 VITALS — BP 120/78 | Ht 64.5 in | Wt 144.0 lb

## 2016-08-11 DIAGNOSIS — N952 Postmenopausal atrophic vaginitis: Secondary | ICD-10-CM

## 2016-08-11 DIAGNOSIS — Z1509 Genetic susceptibility to other malignant neoplasm: Secondary | ICD-10-CM

## 2016-08-11 DIAGNOSIS — M8580 Other specified disorders of bone density and structure, unspecified site: Secondary | ICD-10-CM | POA: Diagnosis not present

## 2016-08-11 DIAGNOSIS — Z01419 Encounter for gynecological examination (general) (routine) without abnormal findings: Secondary | ICD-10-CM

## 2016-08-11 DIAGNOSIS — Z1501 Genetic susceptibility to malignant neoplasm of breast: Secondary | ICD-10-CM

## 2016-08-11 DIAGNOSIS — Z124 Encounter for screening for malignant neoplasm of cervix: Secondary | ICD-10-CM

## 2016-08-11 DIAGNOSIS — N760 Acute vaginitis: Secondary | ICD-10-CM | POA: Diagnosis not present

## 2016-08-11 LAB — WET PREP FOR TRICH, YEAST, CLUE
TRICH WET PREP: NONE SEEN
Yeast Wet Prep HPF POC: NONE SEEN

## 2016-08-11 MED ORDER — METRONIDAZOLE 0.75 % VA GEL: 1.0000 | Freq: Two times a day (BID) | VAGINAL | 0 refills | Status: DC

## 2016-08-11 MED ORDER — FLUCONAZOLE 150 MG PO TABS: 150.0000 mg | ORAL_TABLET | Freq: Once | ORAL | 0 refills | Status: AC

## 2016-08-11 NOTE — Patient Instructions (Signed)

## 2016-08-11 NOTE — Progress Notes (Signed)
[name] Mar 19, 1946 [id number]        70 y.o.  G2P0020  for breast and pelvic exam. Several issues noted below.  Past medical history,surgical history, problem list, medications, allergies, family history and social history were all reviewed and documented as reviewed in the EPIC chart.  ROS:  Performed with pertinent positives and negatives included in the history, assessment and plan.   Additional significant findings :  None   Exam: [name] assistant Vitals:   08/11/16 1028  BP: 120/78  Weight: 144 lb (65.3 kg)  Height: 5' 4.5" (1.638 m)   Body mass index is 24.34 kg/m.  General appearance:  Normal affect, orientation and appearance. Skin: Grossly normal HEENT: Without gross lesions.  No cervical or supraclavicular adenopathy. Thyroid normal.  Lungs:  Clear without wheezing, rales or rhonchi Cardiac: RR, without RMG Abdominal:  Soft, nontender, without masses, guarding, rebound, organomegaly or hernia Breast/chest wall:  Examined lying and sitting. Right breast without masses, retractions, discharge or axillary adenopathy. Left chest wall without masses or axillary adenopathy Pelvic:  Ext/BUS/Vagina with atrophic changes. White discharge noted  Cervix atrophic changes. Pap smear done  Uterus anteverted, normal size, shape and contour, midline and mobile nontender   Adnexa without masses or tenderness    Anus and perineum normal   Rectovaginal normal sphincter tone without palpated masses or tenderness.    Assessment/Plan:  70 y.o. G73P0020 female for breast and pelvic exam.  1. Vaginal discharge. Patient recently treated for vaginal discharge and irritation to include Flagyl which caused a lot of GI irritation, switched to Cleocin vaginal and noted that her symptoms have persisted. Exam does show a whitish discharge with wet prep consistent with bacterial vaginosis. Will treat with MetroGel nightly 1 week. Will also add Diflucan arbitrarily to cover  subclinical yeast as wet prep is negative given itching symptoms. Follow up if symptoms persist, worsen or recur. 2. Osteopenia. DEXA 2017 T score -1.6. Had improvement in the right femoral neck but had some loss of the AP spine. FRAX 4%/0.6%. I reviewed her results with her and recommendations that we monitor for now and plan on repeating her bone density in 2 years. Patient agrees with this. 3. History of BRCA2 positive. Exam today NED. Has mammogram scheduled. Is also doing MRI at 6 month interval and and is going to arrange for this also. Had recent ultrasound where a right adnexal area was seen last year but now appears to have resolved. She is status post LSO where during the laparoscopy there was no right tube and ovary identified. It was felt that this was removed at the time of her ectopic surgery previously. We'll continue to follow and I discussed options with her. Will plan follow up ultrasound in one year. 4. Colonoscopy 2014. Repeat at their recommended interval. 5. Pap smear 2014. Options to stop screening altogether as she is over the age of 69 without history of significant abnormal Pap smears reviewed. Patient is uncomfortable with this and requests Pap smear this year. Pap smear done. 6. Health maintenance. No routine lab work done as patient reports this done elsewhere. Follow up in one year, sooner as needed.  15 minutes of my time in excess of her breast and pelvic exam was spent in direct face to face counseling and coordination of care in regards to her issues of osteopenia with DEXA review and recommendations, vaginal discharge with medication treatment and history of BRCA2 positive with review of situation and plan.Marland Kitchen  [name] MD, 11:43 AM 08/11/2016

## 2016-08-12 LAB — PAP IG W/ RFLX HPV ASCU

## 2016-08-16 ENCOUNTER — Encounter: Payer: Self-pay | Admitting: Gynecology

## 2016-08-16 DIAGNOSIS — Z853 Personal history of malignant neoplasm of breast: Secondary | ICD-10-CM | POA: Diagnosis not present

## 2016-08-16 DIAGNOSIS — Z1231 Encounter for screening mammogram for malignant neoplasm of breast: Secondary | ICD-10-CM | POA: Diagnosis not present

## 2016-09-20 DIAGNOSIS — Z23 Encounter for immunization: Secondary | ICD-10-CM | POA: Diagnosis not present

## 2016-10-13 DIAGNOSIS — I10 Essential (primary) hypertension: Secondary | ICD-10-CM | POA: Diagnosis not present

## 2016-10-13 DIAGNOSIS — M859 Disorder of bone density and structure, unspecified: Secondary | ICD-10-CM | POA: Diagnosis not present

## 2016-10-13 DIAGNOSIS — E7800 Pure hypercholesterolemia, unspecified: Secondary | ICD-10-CM | POA: Diagnosis not present

## 2016-10-20 ENCOUNTER — Ambulatory Visit (INDEPENDENT_AMBULATORY_CARE_PROVIDER_SITE_OTHER): Payer: Medicare Other | Admitting: Gynecology

## 2016-10-20 ENCOUNTER — Encounter: Payer: Self-pay | Admitting: Gynecology

## 2016-10-20 VITALS — BP 122/76

## 2016-10-20 DIAGNOSIS — R300 Dysuria: Secondary | ICD-10-CM | POA: Diagnosis not present

## 2016-10-20 DIAGNOSIS — N760 Acute vaginitis: Secondary | ICD-10-CM

## 2016-10-20 LAB — WET PREP FOR TRICH, YEAST, CLUE
Clue Cells Wet Prep HPF POC: NONE SEEN
Trich, Wet Prep: NONE SEEN
Yeast Wet Prep HPF POC: NONE SEEN

## 2016-10-20 LAB — URINALYSIS W MICROSCOPIC + REFLEX CULTURE
BILIRUBIN URINE: NEGATIVE
Casts: NONE SEEN [LPF]
Crystals: NONE SEEN [HPF]
GLUCOSE, UA: NEGATIVE
Hgb urine dipstick: NEGATIVE
LEUKOCYTES UA: NEGATIVE
NITRITE: NEGATIVE
PH: 5.5 (ref 5.0–8.0)
RBC / HPF: NONE SEEN RBC/HPF (ref <=2)
SPECIFIC GRAVITY, URINE: 1.025 (ref 1.001–1.035)
WBC UA: NONE SEEN WBC/HPF (ref <=5)
YEAST: NONE SEEN [HPF]

## 2016-10-20 MED ORDER — FLUCONAZOLE 150 MG PO TABS: 150.0000 mg | ORAL_TABLET | Freq: Once | ORAL | 0 refills | Status: AC

## 2016-10-20 NOTE — Patient Instructions (Signed)
Take the Diflucan pill. Hold your cholesterol medicine the day you take the Diflucan pill. Follow up if your symptoms continue or recur.

## 2016-10-20 NOTE — Addendum Note (Signed)
Addended by: Nelva Nay on: 10/20/2016 11:11 AM   Modules accepted: Orders

## 2016-10-20 NOTE — Progress Notes (Signed)
    [name] 07/08/1946 [id number]        70 y.o.  G2P0020 Presents with several days of vaginal irritation in some stinging with urination. No significant discharge , itching, suprapubic discomfort, urgency, frequency , low back pain. No fever or chills.  Past medical history,surgical history, problem list, medications, allergies, family history and social history were all reviewed and documented in the EPIC chart.  Directed ROS with pertinent positives and negatives documented in the history of present illness/assessment and plan.  Exam:  [name] assistant Vitals:   10/20/16 1003  BP: 122/76   General appearance:  Normal Abdomen soft nontender without masses guarding rebound Pelvic external BUS vagina with atrophic changes.   scant discharge. Cervix with atrophic changes. Uterus normal size midline mobile nontender. Adnexa without masses or tenderness.  Assessment/Plan:  70 y.o. G2P0020 with above history and exam.  Urinalysis and wet prep unremarkable. Will cover for low-grade yeast with Diflucan 150 mg 1 dose. Follow up 1 year and culture given there was few bacteria seen although no white cells and 6-10 squamous. Hold Lipitor day of Diflucan. Follow up if symptoms persist, worsen or recur.    [name] MD, 10:16 AM 10/20/2016

## 2016-10-22 LAB — URINE CULTURE: Organism ID, Bacteria: NO GROWTH

## 2016-10-23 DIAGNOSIS — Z6825 Body mass index (BMI) 25.0-25.9, adult: Secondary | ICD-10-CM | POA: Diagnosis not present

## 2016-10-23 DIAGNOSIS — Z1389 Encounter for screening for other disorder: Secondary | ICD-10-CM | POA: Diagnosis not present

## 2016-10-23 DIAGNOSIS — I10 Essential (primary) hypertension: Secondary | ICD-10-CM | POA: Diagnosis not present

## 2016-10-23 DIAGNOSIS — H9319 Tinnitus, unspecified ear: Secondary | ICD-10-CM | POA: Diagnosis not present

## 2016-10-23 DIAGNOSIS — N3010 Interstitial cystitis (chronic) without hematuria: Secondary | ICD-10-CM | POA: Diagnosis not present

## 2016-10-23 DIAGNOSIS — Z0000 Encounter for general adult medical examination without abnormal findings: Secondary | ICD-10-CM | POA: Diagnosis not present

## 2016-10-23 DIAGNOSIS — C50919 Malignant neoplasm of unspecified site of unspecified female breast: Secondary | ICD-10-CM | POA: Diagnosis not present

## 2016-10-23 DIAGNOSIS — G4709 Other insomnia: Secondary | ICD-10-CM | POA: Diagnosis not present

## 2016-10-23 DIAGNOSIS — H4089 Other specified glaucoma: Secondary | ICD-10-CM | POA: Diagnosis not present

## 2016-10-23 DIAGNOSIS — M8580 Other specified disorders of bone density and structure, unspecified site: Secondary | ICD-10-CM | POA: Diagnosis not present

## 2016-10-23 DIAGNOSIS — F321 Major depressive disorder, single episode, moderate: Secondary | ICD-10-CM | POA: Diagnosis not present

## 2016-10-23 DIAGNOSIS — E7800 Pure hypercholesterolemia, unspecified: Secondary | ICD-10-CM | POA: Diagnosis not present

## 2016-10-24 ENCOUNTER — Encounter: Payer: Self-pay | Admitting: Adult Health

## 2016-10-24 ENCOUNTER — Ambulatory Visit (HOSPITAL_BASED_OUTPATIENT_CLINIC_OR_DEPARTMENT_OTHER): Payer: Medicare Other | Admitting: Adult Health

## 2016-10-24 ENCOUNTER — Telehealth: Payer: Self-pay | Admitting: *Deleted

## 2016-10-24 VITALS — BP 157/65 | HR 71 | Temp 98.7°F | Resp 18 | Wt 145.3 lb

## 2016-10-24 DIAGNOSIS — N898 Other specified noninflammatory disorders of vagina: Secondary | ICD-10-CM | POA: Diagnosis not present

## 2016-10-24 DIAGNOSIS — M8580 Other specified disorders of bone density and structure, unspecified site: Secondary | ICD-10-CM

## 2016-10-24 DIAGNOSIS — Z853 Personal history of malignant neoplasm of breast: Secondary | ICD-10-CM | POA: Diagnosis not present

## 2016-10-24 DIAGNOSIS — Z170 Estrogen receptor positive status [ER+]: Principal | ICD-10-CM

## 2016-10-24 DIAGNOSIS — Z1231 Encounter for screening mammogram for malignant neoplasm of breast: Secondary | ICD-10-CM

## 2016-10-24 DIAGNOSIS — C50312 Malignant neoplasm of lower-inner quadrant of left female breast: Secondary | ICD-10-CM

## 2016-10-24 DIAGNOSIS — Z1501 Genetic susceptibility to malignant neoplasm of breast: Secondary | ICD-10-CM

## 2016-10-24 DIAGNOSIS — Z1509 Genetic susceptibility to other malignant neoplasm: Secondary | ICD-10-CM

## 2016-10-24 NOTE — Progress Notes (Signed)
CLINIC:  Survivorship   REASON FOR VISIT:  Routine follow-up for history of breast cancer.   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 [name] presents to the [facility] today for routine follow-up for her history of breast cancer.  Overall, she reports feeling pretty well.    Her biggest concern today is severe vaginal dryness. She tells me she has tried coconut oil and olive oil, as well as several over-the-counter vaginal moisturizers. None had been effective in helping her symptoms. She has severe dyspareunia. She also has frequent vaginal infections, as a result of the vaginal dryness. She sees Dr. Phineas Real, her gynecologist, regularly for follow-up. She states that the vaginal dryness "has been bad" since 08/2013 when she underwent bilateral salpingo-oophorectomy.  She was recently started on Remeron by her PCP. She tells me that she is sleeping better and feels that her mood is improved. She has had quite a bit of stress in her family with the loss of her sisters and she has another sister that is very ill.  She has felt very stressed as a result of trying to help her family.  She thinks the medication is helping her to cope a little better.      REVIEW OF SYSTEMS:  Review of Systems  Constitutional:  Negative.   HENT:  Negative.   Eyes: Positive for eye problems (Glaucoma).  Respiratory: Negative.   Cardiovascular: Negative.   Gastrointestinal: Negative.   Endocrine: Negative.  Negative for hot flashes.  Genitourinary: Positive for dyspareunia and vaginal discharge. Negative for vaginal bleeding.   Musculoskeletal: Negative.   Skin: Negative.   Neurological: Positive for headaches (Occasional).  Hematological: Negative.   Psychiatric/Behavioral: Positive for depression. The patient is nervous/anxious.   Breast: Denies any new nodularity, masses, tenderness, nipple changes, or nipple discharge.    A 14-point review of systems was completed and was negative, except as noted above.    PAST MEDICAL/SURGICAL HISTORY:  Past Medical History:  Diagnosis Date  . 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 Glaucoma   . History of cystitis   . 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 Hyperlipidemia   . Hypertension   . Osteopenia 02/2016   T score -1.6 FRAX 4%/0.6%   Past Surgical History:  Procedure Laterality Date  . BILATERAL SALPINGOOPHORECTOMY  9.6.12   Left side removed. Right tube and ovary never identified  . BREAST SURGERY  08-29-2005   TOTAL LEFT MASTECTOMY W/ NODE DISSECTION X4  . CARPAL TUNNEL RELEASE  2007   RIGHT  . CATARACT EXTRACTION  Right eye  . DILATION AND CURETTAGE OF UTERUS  1968  . dx laparoscopy  9.6.12   resulted into open salpingo-opphorectomy   . ECTOPIC PREGNANCY SURGERY  73 & 74  . EYE SURGERY  2001   RIGHT EYE FOR GLAUCOMA  . HAND SURGERY    . HYSTEROSCOPY W/D&C  11/05/2012   Procedure: DILATATION AND CURETTAGE /HYSTEROSCOPY;  Surgeon: Bennetta Laos, MD;  Location: [facility];  Service: Gynecology;  Laterality: N/A;  . PORTA CATH    . TRANSTHORACIC ECHOCARDIOGRAM  10-14-2005   LVSF NORMAL/ EF  55-65%/ MILD MITRAL REGURG     ALLERGIES:  No Known Allergies   CURRENT MEDICATIONS:  Outpatient Encounter Prescriptions as of 10/24/2016  Medication Sig Note  . atorvastatin (LIPITOR) 20 MG tablet  04/25/2014: Takes 54m  . Brimonidine Tartrate (ALPHAGAN P OP) Apply to eye 3 (three) times daily.    . Calcium Carbonate-Vit D-Min (CALTRATE 600+D PLUS PO) Take 1 tablet by mouth 2 (two) times daily.    . Cholecalciferol (VITAMIN D) 1000 UNITS capsule Take 1,000 Units by mouth daily.    . dorzolamide-timolol (COSOPT) 22.3-6.8 MG/ML ophthalmic solution Place 1 drop into both eyes 2 (two) times daily.    .Marland Kitchenlatanoprost (XALATAN) 0.005 % ophthalmic solution Place 1 drop into both eyes at bedtime.    .Marland Kitchenlosartan-hydrochlorothiazide (HYZAAR) 50-12.5 MG per tablet Take 1 tablet by mouth every morning.    .Marland KitchenMIRTAZAPINE PO Take 1 tablet by mouth at bedtime.    . [DISCONTINUED] ALPHAGAN P 0.1 % SOLN  10/26/2015: Received from: External Pharmacy  . [DISCONTINUED] clindamycin (CLEOCIN) 2 % vaginal cream Place 1 Applicatorful vaginally at bedtime. (Patient not taking: Reported on 10/24/2016)   . [DISCONTINUED] metroNIDAZOLE (FLAGYL) 500 MG tablet Take 1 tablet (500 mg total) by mouth 2 (two) times daily. For 7 days.  Avoid alcohol while taking (Patient not taking: Reported on 10/24/2016)   . [DISCONTINUED] metroNIDAZOLE (METROGEL VAGINAL) 0.75 % vaginal gel Place 1 Applicatorful vaginally 2 (two) times daily. For 5 days (Patient not taking: Reported on 10/24/2016)    No facility-administered encounter medications on file as of 10/24/2016.      ONCOLOGIC FAMILY HISTORY:  Family History  Problem Relation Age of Onset  . Hypertension Mother   . Cancer Father     COLON  . Breast cancer Sister     Age 57449's . Diabetes Sister   . Breast cancer Sister     Age 57432's . Breast cancer Sister     Age 57414's . Breast cancer Paternal Grandmother     Age unknown  . Cancer Paternal Grandfather     Unknown type     GENETIC COUNSELING/TESTING: BRCA2+   SOCIAL HISTORY:  Alicia TRIGOis married [place]  She is [place].  She denies any current tobacco, alcohol, or illicit drug use.     PHYSICAL EXAMINATION:  Vital Signs: Vitals:   10/24/16 1142  BP: (!) 157/65  Pulse: 71  Resp: 18  Temp: 98.7 F (37.1 C)   Filed Weights   10/24/16 1142  Weight: 145 lb 4.8 oz (65.9 kg)   General: Well-nourished, well-appearing female in no acute distress.  Unaccompanied today.   HEENT: Head is normocephalic.  Pupils equal and reactive to light. Conjunctivae clear without exudate.  Sclerae anicteric. Oral mucosa is pink, moist.  Oropharynx is pink without lesions or erythema.  Lymph: No cervical, supraclavicular, or infraclavicular  lymphadenopathy noted on palpation.  Cardiovascular: Regular rate and rhythm.Marland Kitchen Respiratory: Clear to auscultation bilaterally. Chest expansion symmetric; breathing non-labored.  Breast Exam:  -Left breast: s/p mastectomy without reconstruction. No appreciable masses on palpation. No skin redness, thickening, or peau d'orange appearance; healed scar without erythema or nodularity.  -Right breast: No appreciable masses on palpation. No skin redness, thickening, or peau d'orange appearance; no nipple retraction or nipple discharge. -Axilla: No axillary adenopathy bilaterally.  GI: Abdomen soft and round; non-tender, non-distended. Bowel sounds normoactive. No hepatosplenomegaly.   GU: Deferred.  Neuro: No focal deficits. Steady gait.  Psych: Mood and affect normal and appropriate for situation.  Extremities: No edema. Skin: Warm and dry.  LABORATORY DATA:  None for this visit.   DIAGNOSTIC IMAGING:  Most recent mammogram: 08/16/16 [facility]) - Right breast only    Last Breast MRI: 11/15/15      ASSESSMENT AND PLAN:  Ms.. Johns is a pleasant 70 y.o. female with history of Stage IIA left breast invasive ductal carcinoma,  ER+/PR-/HER2+, diagnosed in 08/2005;  treated with left mastectomy, adjuvant radiation chemotherapy, and anti-estrogen therapy for about 7 years with Letrozole, then Tamoxifen, then Raloxifene. She completed anti-estrogen therapy in 10/2013.  She [facility] for surveillance and routine follow-up.    1. History of Stage IIA left breast cancer, BRCA2+:  Ms. Siharath is currently clinically and radiographically without evidence of disease or recurrence of breast cancer. Her last breast MRI for continued surveillance given her BRCA2 genetic mutation was done in 10/2015 and was negative. Her last mammogram was done in 07/2016 and was normal.  Therefore, we will plan on having her get her breast MRI sometime in 01/2017 and annual right breast unilateral screening mammogram in 07/2017.  With this schedule, she will have some type of breast imaging every 6 months, given her genetic mutation/high-risk disease. She will return to the cancer center to see Survivorship NP in 10/2017.  I encouraged her to call me with any questions or concerns before her next visit at the cancer center, and I would be happy to see her sooner, if needed.    2. Vaginal dryness: Ms. Derderian is experiencing vaginal dryness impacting her day-to-day life. She has tried several different oils (coconut oil & olive oil) and vaginal moisturizers, which have not provided her any relief. She has severe dyspareunia as well. She has frequent vaginal infections, and even has vaginal pain when wiping with toilet tissue after urinating.  This is been a problem for her since she underwent BSO in 08/2013, but she feels has progressively gotten worse.  She sees her gynecologist, Dr. Phineas Real, regularly who helps manage her vaginal health.  We discussed the option of sending her for consultation for the [place] procedure at [facility].  I shared with her that we have had very promising results from the [place] by  many of our patients suffering with severe vaginal dryness.  The downside is that these procedures are generally not covered by insurance and are quite expensive.  The patient is not sure if she can afford the treatments, but is willing to meet with the team at [facility] to discuss her options.  I am happy to place the referral today. We will work on getting her scheduled there as soon as they have availability.  She was given a brochure about the [place] procedure today for further information.   3. Bone health:  Given Ms. Paolucci's  age & history of breast cancer, she is at risk for bone demineralization. Her last DEXA scan was on 03/18/16 and showed osteopenia. She will be due for biennial DEXA imaging in 02/2018.  In the meantime, she was encouraged to increase her consumption of foods rich in calcium, as well as increase her weight-bearing activities.  She was given education on specific food and activities to promote bone health.  4. Health maintenance and wellness promotion: Ms. Weill was encouraged to consume 5-7 servings of fruits and vegetables per day. She was also encouraged to engage in moderate to vigorous exercise for 30 minutes per day most days of the week. She was instructed to limit her alcohol consumption and continue to abstain from tobacco use.    Dispo:  -Referral to [facility] for [facility] Touch consultation for vaginal dryness.  -Annual unilateral right breast mammogram due in 07/2017 at [facility]; orders placed today.  -Will get results from most recent MRI Breasts; she will be due for annual breast MRI given BRCA2 mutation.  -Return to cancer center to see Survivorship NP in 10/2017.   A total of 30 minutes of face-to-face time was spent with this patient with greater than 50% of that time in counseling and care-coordination.   Mike Craze, NP [facility]orship [facility] [place] [phone]   Note: PRIMARY CARE  PROVIDER [place], [place] [phone]

## 2016-10-24 NOTE — Telephone Encounter (Signed)
Called pt to let her know the last MRI / Breast she had done was 10/2015. I told pt as soon as we have everything coordinated she will receive call to let her know when this appt will be. Pt verbalized understanding and said she would look to receive our call. Message to be fwd to G. Dawson,NP.

## 2016-11-10 DIAGNOSIS — Z1212 Encounter for screening for malignant neoplasm of rectum: Secondary | ICD-10-CM | POA: Diagnosis not present

## 2016-11-10 DIAGNOSIS — H401112 Primary open-angle glaucoma, right eye, moderate stage: Secondary | ICD-10-CM | POA: Diagnosis not present

## 2016-11-10 DIAGNOSIS — H401122 Primary open-angle glaucoma, left eye, moderate stage: Secondary | ICD-10-CM | POA: Diagnosis not present

## 2017-02-06 DIAGNOSIS — H401132 Primary open-angle glaucoma, bilateral, moderate stage: Secondary | ICD-10-CM | POA: Diagnosis not present

## 2017-02-09 DIAGNOSIS — H401132 Primary open-angle glaucoma, bilateral, moderate stage: Secondary | ICD-10-CM | POA: Diagnosis not present

## 2017-03-16 ENCOUNTER — Encounter: Payer: Self-pay | Admitting: Gynecology

## 2017-03-16 ENCOUNTER — Ambulatory Visit (INDEPENDENT_AMBULATORY_CARE_PROVIDER_SITE_OTHER): Payer: Medicare Other | Admitting: Gynecology

## 2017-03-16 VITALS — BP 116/74

## 2017-03-16 DIAGNOSIS — N3000 Acute cystitis without hematuria: Secondary | ICD-10-CM

## 2017-03-16 DIAGNOSIS — N898 Other specified noninflammatory disorders of vagina: Secondary | ICD-10-CM | POA: Diagnosis not present

## 2017-03-16 LAB — URINALYSIS W MICROSCOPIC + REFLEX CULTURE
Bilirubin Urine: NEGATIVE
CASTS: NONE SEEN [LPF]
CRYSTALS: NONE SEEN [HPF]
GLUCOSE, UA: NEGATIVE
Hgb urine dipstick: NEGATIVE
Ketones, ur: NEGATIVE
Leukocytes, UA: NEGATIVE
NITRITE: NEGATIVE
PROTEIN: NEGATIVE
RBC / HPF: NONE SEEN RBC/HPF (ref <=2)
SPECIFIC GRAVITY, URINE: 1.025 (ref 1.001–1.035)
Yeast: NONE SEEN [HPF]
pH: 5.5 (ref 5.0–8.0)

## 2017-03-16 LAB — WET PREP FOR TRICH, YEAST, CLUE
TRICH WET PREP: NONE SEEN
YEAST WET PREP: NONE SEEN

## 2017-03-16 MED ORDER — CLINDAMYCIN PHOSPHATE 2 % VA CREA: 1.0000 | TOPICAL_CREAM | Freq: Every day | VAGINAL | 0 refills | Status: DC

## 2017-03-16 MED ORDER — SULFAMETHOXAZOLE-TRIMETHOPRIM 800-160 MG PO TABS: 1.0000 | ORAL_TABLET | Freq: Two times a day (BID) | ORAL | 0 refills | Status: DC

## 2017-03-16 NOTE — Addendum Note (Signed)
Addended by: Nelva Nay on: 03/16/2017 12:39 PM   Modules accepted: Orders

## 2017-03-16 NOTE — Progress Notes (Signed)
    [name] [birth year] [id number]        71 y.o.  G2P0020 presents complaining of vaginal discharge with irritation of the past week. Does have a history of vaginitis in the past. Notes some mild dysuria, lower abdominal discomfort and back pain. No fever or chills. No nausea vomiting diarrhea constipation. No urgency or frequency.  Past medical history,surgical history, problem list, medications, allergies, family history and social history were all reviewed and documented in the EPIC chart.  Directed ROS with pertinent positives and negatives documented in the history of present illness/assessment and plan.  Exam: [name] assistant Vitals:   03/16/17 1107  BP: 116/74   General appearance:  Normal Spine straight without CVA tenderness Abdomen soft nontender without masses guarding rebound Pelvic external BUS vagina with atrophic changes. Cervix with atrophic changes. Uterus normal size midline mobile nontender. Adnexa without masses or tenderness.  Assessment/Plan:  71 y.o. G2P0020 with:  1. UTI symptoms. Does have mild symptoms to suggest early UTI. Urine analysis shows moderate bacteria but 6-10 squamous cells suggesting contamination. Will cover with Septra DS 1 by mouth twice a day 3 days and culture the urine. 2. Vaginal irritation. Wet prep consistent with bacterial vaginosis. Will treat with Cleocin vaginal cream nightly 7 nights. 3. Atrophic vaginal changes. Has been using OTC lubricants/moisturizers but still having symptoms. Talked to her doctor at the cancer center and apparently they gave her literature on the [place] treatment. I reviewed with her there are no large population-based studies show long-term safety and the question of how long the procedure the last and whether "touchups" will needed to be done. She is not interested at this point to pursue this but if she is that she'll further discuss this with the other physicians.    [name] MD, 11:38 AM 03/16/2017

## 2017-03-16 NOTE — Patient Instructions (Signed)
Take the antibiotic pill twice daily for 3 days. Use the antibiotic vaginal cream nightly for 7 nights.

## 2017-03-18 DIAGNOSIS — H401132 Primary open-angle glaucoma, bilateral, moderate stage: Secondary | ICD-10-CM | POA: Diagnosis not present

## 2017-03-18 LAB — URINE CULTURE: ORGANISM ID, BACTERIA: NO GROWTH

## 2017-04-02 ENCOUNTER — Telehealth: Payer: Self-pay | Admitting: *Deleted

## 2017-04-02 MED ORDER — METRONIDAZOLE 0.75 % VA GEL: 1.0000 | Freq: Every day | VAGINAL | 0 refills | Status: DC

## 2017-04-02 NOTE — Telephone Encounter (Signed)
Pt was prescribed cleocin vaginal cream on 03/16/17 completed Rx but still have internal vaginal irritation only. Pt said that metrogel worked better, asked if you could prescribe this? Please advise

## 2017-04-02 NOTE — Telephone Encounter (Signed)
Pt informed, Rx sent. 

## 2017-04-02 NOTE — Telephone Encounter (Signed)
Okay for MetroGel nightly 5 nights

## 2017-04-08 ENCOUNTER — Encounter: Payer: Self-pay | Admitting: Gynecology

## 2017-05-05 DIAGNOSIS — H4089 Other specified glaucoma: Secondary | ICD-10-CM | POA: Diagnosis not present

## 2017-05-05 DIAGNOSIS — E7800 Pure hypercholesterolemia, unspecified: Secondary | ICD-10-CM | POA: Diagnosis not present

## 2017-05-05 DIAGNOSIS — G4709 Other insomnia: Secondary | ICD-10-CM | POA: Diagnosis not present

## 2017-05-05 DIAGNOSIS — F321 Major depressive disorder, single episode, moderate: Secondary | ICD-10-CM | POA: Diagnosis not present

## 2017-05-05 DIAGNOSIS — N3010 Interstitial cystitis (chronic) without hematuria: Secondary | ICD-10-CM | POA: Diagnosis not present

## 2017-05-05 DIAGNOSIS — C50919 Malignant neoplasm of unspecified site of unspecified female breast: Secondary | ICD-10-CM | POA: Diagnosis not present

## 2017-05-05 DIAGNOSIS — I10 Essential (primary) hypertension: Secondary | ICD-10-CM | POA: Diagnosis not present

## 2017-05-05 DIAGNOSIS — M859 Disorder of bone density and structure, unspecified: Secondary | ICD-10-CM | POA: Diagnosis not present

## 2017-05-05 DIAGNOSIS — Z6825 Body mass index (BMI) 25.0-25.9, adult: Secondary | ICD-10-CM | POA: Diagnosis not present

## 2017-06-17 DIAGNOSIS — H401112 Primary open-angle glaucoma, right eye, moderate stage: Secondary | ICD-10-CM | POA: Diagnosis not present

## 2017-06-17 DIAGNOSIS — H401122 Primary open-angle glaucoma, left eye, moderate stage: Secondary | ICD-10-CM | POA: Diagnosis not present

## 2017-06-19 DIAGNOSIS — H25012 Cortical age-related cataract, left eye: Secondary | ICD-10-CM | POA: Diagnosis not present

## 2017-06-19 DIAGNOSIS — H401113 Primary open-angle glaucoma, right eye, severe stage: Secondary | ICD-10-CM | POA: Diagnosis not present

## 2017-06-19 DIAGNOSIS — H2512 Age-related nuclear cataract, left eye: Secondary | ICD-10-CM | POA: Diagnosis not present

## 2017-06-19 DIAGNOSIS — H401122 Primary open-angle glaucoma, left eye, moderate stage: Secondary | ICD-10-CM | POA: Diagnosis not present

## 2017-06-24 ENCOUNTER — Other Ambulatory Visit: Payer: Self-pay | Admitting: *Deleted

## 2017-06-24 DIAGNOSIS — Z1509 Genetic susceptibility to other malignant neoplasm: Principal | ICD-10-CM

## 2017-06-24 DIAGNOSIS — Z1501 Genetic susceptibility to malignant neoplasm of breast: Secondary | ICD-10-CM

## 2017-07-29 ENCOUNTER — Telehealth: Payer: Self-pay | Admitting: *Deleted

## 2017-07-29 MED ORDER — METRONIDAZOLE 0.75 % VA GEL: 1.0000 | Freq: Every day | VAGINAL | 0 refills | Status: DC

## 2017-07-29 NOTE — Telephone Encounter (Signed)
Okay for MetroGel 1 applicator bedtime 5 days

## 2017-07-29 NOTE — Telephone Encounter (Signed)
Pt called c/o "recurrent infection" lower back discomfort, no urinary issues, vaginal odor x 2 weeks now states metrogel typically helps with this. Please advise

## 2017-07-29 NOTE — Telephone Encounter (Signed)
Pt aware, Rx sent. 

## 2017-08-12 ENCOUNTER — Encounter: Payer: Self-pay | Admitting: Gynecology

## 2017-08-12 ENCOUNTER — Ambulatory Visit (INDEPENDENT_AMBULATORY_CARE_PROVIDER_SITE_OTHER): Payer: Medicare Other | Admitting: Gynecology

## 2017-08-12 VITALS — BP 116/74 | Ht 64.0 in | Wt 146.0 lb

## 2017-08-12 DIAGNOSIS — M8580 Other specified disorders of bone density and structure, unspecified site: Secondary | ICD-10-CM

## 2017-08-12 DIAGNOSIS — Z01411 Encounter for gynecological examination (general) (routine) with abnormal findings: Secondary | ICD-10-CM

## 2017-08-12 DIAGNOSIS — N952 Postmenopausal atrophic vaginitis: Secondary | ICD-10-CM

## 2017-08-12 DIAGNOSIS — C50912 Malignant neoplasm of unspecified site of left female breast: Secondary | ICD-10-CM

## 2017-08-12 NOTE — Progress Notes (Signed)
[name] 05/13/1946 [id number]        71 y.o.  G2P0020 for breast and pelvic exam.  Past medical history,surgical history, problem list, medications, allergies, family history and social history were all reviewed and documented as reviewed in the EPIC chart.  ROS:  Performed with pertinent positives and negatives included in the history, assessment and plan.   Additional significant findings :  None   Exam: [name] assistant Vitals:   08/12/17 1456  BP: 116/74  Weight: 146 lb (66.2 kg)  Height: 5' 4" (1.626 m)   Body mass index is 25.06 kg/m.  General appearance:  Normal affect, orientation and appearance. Skin: Grossly normal HEENT: Without gross lesions.  No cervical or supraclavicular adenopathy. Thyroid normal.  Lungs:  Clear without wheezing, rales or rhonchi Cardiac: RR, without RMG Abdominal:  Soft, nontender, without masses, guarding, rebound, organomegaly or hernia Breasts:  Examined lying and sitting. Right without masses, retractions, discharge or axillary adenopathy.  Left chest wall status post mastectomy without masses or adenopathy Pelvic:  Ext, BUS, Vagina: With atrophic changes  Cervix: With atrophic changes  Uterus: Anteverted, normal size, shape and contour, midline and mobile nontender   Adnexa: Without masses or tenderness    Anus and perineum: Normal   Rectovaginal: Normal sphincter tone without palpated masses or tenderness.    Assessment/Plan:  71 y.o. G75P0020 female for annual gynecologic exam.   1. Postmenopausal/atrophic genital changes. No significant hot flushes, night sweats, vaginal dryness or any vaginal bleeding. 2. Osteopenia. DEXA 2017 T score -1.6 FRAX 4%/0.6%. Plan repeat DEXA next year to year interval. 3. 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 Does mammography annually and MRI annually. Coming due for mammogram this month. Will arrange for MRI this coming year. Breast exam normal  today.  Had LSO laparoscopically. Assessment at that time did not find right tube and ovary it was felt that this was removed when she had her ectopic surgery previously. Ultrasound 2016 showed a small area right adnexa questionable ovarian remnant versus scar tissue. Follow up ultrasound 2017 was negative. Patient has ultrasound scheduled next week for surveillance. Options to cancel this versus proceed with the ultrasound discussed. At this point patient feels more comfortable having the ultrasound just to make sure there is no pathology in the right adnexa. 4. Colonoscopy 2014. Repeat at their recommended interval. 5. Pap smear 2017. No Pap smear done today. Options to stop screening per current screening guidelines based on age reviewed. Will readdress on annual basis. 6. Health maintenance. No routine lab work done as patient does this elsewhere. Follow up 1 year, sooner as needed.   [name] MD, 3:22 PM 08/12/2017

## 2017-08-12 NOTE — Patient Instructions (Signed)
Follow up for ultrasound as scheduled 

## 2017-08-17 ENCOUNTER — Ambulatory Visit: Payer: Medicare Other | Admitting: Gynecology

## 2017-08-17 ENCOUNTER — Other Ambulatory Visit: Payer: Medicare Other

## 2017-08-18 ENCOUNTER — Encounter: Payer: Self-pay | Admitting: Gynecology

## 2017-08-18 DIAGNOSIS — Z853 Personal history of malignant neoplasm of breast: Secondary | ICD-10-CM | POA: Diagnosis not present

## 2017-08-18 DIAGNOSIS — Z1231 Encounter for screening mammogram for malignant neoplasm of breast: Secondary | ICD-10-CM | POA: Diagnosis not present

## 2017-08-19 DIAGNOSIS — H401132 Primary open-angle glaucoma, bilateral, moderate stage: Secondary | ICD-10-CM | POA: Diagnosis not present

## 2017-08-24 ENCOUNTER — Ambulatory Visit (INDEPENDENT_AMBULATORY_CARE_PROVIDER_SITE_OTHER): Payer: Medicare Other | Admitting: Gynecology

## 2017-08-24 ENCOUNTER — Ambulatory Visit (INDEPENDENT_AMBULATORY_CARE_PROVIDER_SITE_OTHER): Payer: Medicare Other

## 2017-08-24 ENCOUNTER — Encounter: Payer: Self-pay | Admitting: Gynecology

## 2017-08-24 ENCOUNTER — Other Ambulatory Visit: Payer: Self-pay | Admitting: Gynecology

## 2017-08-24 VITALS — BP 122/78

## 2017-08-24 DIAGNOSIS — Z1501 Genetic susceptibility to malignant neoplasm of breast: Secondary | ICD-10-CM

## 2017-08-24 DIAGNOSIS — R1031 Right lower quadrant pain: Secondary | ICD-10-CM

## 2017-08-24 DIAGNOSIS — Z1509 Genetic susceptibility to other malignant neoplasm: Secondary | ICD-10-CM | POA: Diagnosis not present

## 2017-08-24 DIAGNOSIS — N9983 Residual ovary syndrome: Secondary | ICD-10-CM | POA: Diagnosis not present

## 2017-08-24 DIAGNOSIS — N83201 Unspecified ovarian cyst, right side: Secondary | ICD-10-CM | POA: Diagnosis not present

## 2017-08-24 NOTE — Progress Notes (Signed)
    [name] 1946-04-19 [id number]        71 y.o.  G2P0020 presents for ultrasound. History of left breast cancer, positive BRCA2. Status post laparoscopic LSO. At that time Dr. Cherylann Banas could not identify a right tube or ovary it was thought that it was removed at a prior ectopic surgery. Ultrasounds 2013 did not show a right ovary, 2016 a measured area 16.8 x 7.7 mm, 2017 no identified right ovary.  Past medical history,surgical history, problem list, medications, allergies, family history and social history were all reviewed and documented in the EPIC chart.  Directed ROS with pertinent positives and negatives documented in the history of present illness/assessment and plan.  Exam: Vitals:   08/24/17 0952  BP: 122/78   General appearance:  Normal  Ultrasound transvaginal and transabdominal shows uterus retroverted with endometrial echo 3.8 mm. Right ovary atrophic 1.4 cc volume. Left ovary not visualized. Right/left adnexa without identifiable pathology. Cul-de-sac negative  Assessment/Plan:  71 y.o. G2P0020 with small area right adnexa questionable ovary/remnant versus non-ovary such as scar tissue or other tissue. Was not visualized on laparoscopic evaluation previously. Reviewed issues with the patient who is BRCA2 positive whether to pursue this surgically try to find this area versus continued observation. Issues of missed pathology and ovarian cancer discussed. Patient is comfortable with continued expectant management and will plan annual ultrasounds for surveillance of this area.   Greater than 50% of my time was spent in direct face to face counseling and coordination of care with the patient.     Alicia Auerbach MD, 10:06 AM 08/24/2017

## 2017-08-24 NOTE — Patient Instructions (Signed)
Follow up in one year for annual exam and repeat ultrasound

## 2017-09-05 DIAGNOSIS — Z23 Encounter for immunization: Secondary | ICD-10-CM | POA: Diagnosis not present

## 2017-10-20 DIAGNOSIS — M859 Disorder of bone density and structure, unspecified: Secondary | ICD-10-CM | POA: Diagnosis not present

## 2017-10-20 DIAGNOSIS — I10 Essential (primary) hypertension: Secondary | ICD-10-CM | POA: Diagnosis not present

## 2017-10-20 DIAGNOSIS — R82998 Other abnormal findings in urine: Secondary | ICD-10-CM | POA: Diagnosis not present

## 2017-10-20 DIAGNOSIS — E7800 Pure hypercholesterolemia, unspecified: Secondary | ICD-10-CM | POA: Diagnosis not present

## 2017-10-27 ENCOUNTER — Telehealth: Payer: Self-pay | Admitting: Adult Health

## 2017-10-27 DIAGNOSIS — E7800 Pure hypercholesterolemia, unspecified: Secondary | ICD-10-CM | POA: Diagnosis not present

## 2017-10-27 DIAGNOSIS — M859 Disorder of bone density and structure, unspecified: Secondary | ICD-10-CM | POA: Diagnosis not present

## 2017-10-27 DIAGNOSIS — C50919 Malignant neoplasm of unspecified site of unspecified female breast: Secondary | ICD-10-CM | POA: Diagnosis not present

## 2017-10-27 DIAGNOSIS — I10 Essential (primary) hypertension: Secondary | ICD-10-CM | POA: Diagnosis not present

## 2017-10-27 DIAGNOSIS — F321 Major depressive disorder, single episode, moderate: Secondary | ICD-10-CM | POA: Diagnosis not present

## 2017-10-27 DIAGNOSIS — Z0000 Encounter for general adult medical examination without abnormal findings: Secondary | ICD-10-CM | POA: Diagnosis not present

## 2017-10-27 DIAGNOSIS — H4010X0 Unspecified open-angle glaucoma, stage unspecified: Secondary | ICD-10-CM | POA: Diagnosis not present

## 2017-10-27 DIAGNOSIS — H9313 Tinnitus, bilateral: Secondary | ICD-10-CM | POA: Diagnosis not present

## 2017-10-27 DIAGNOSIS — Z1389 Encounter for screening for other disorder: Secondary | ICD-10-CM | POA: Diagnosis not present

## 2017-10-27 DIAGNOSIS — G4709 Other insomnia: Secondary | ICD-10-CM | POA: Diagnosis not present

## 2017-10-27 DIAGNOSIS — N3010 Interstitial cystitis (chronic) without hematuria: Secondary | ICD-10-CM | POA: Diagnosis not present

## 2017-10-27 DIAGNOSIS — Z6825 Body mass index (BMI) 25.0-25.9, adult: Secondary | ICD-10-CM | POA: Diagnosis not present

## 2017-10-27 NOTE — Telephone Encounter (Signed)
PAL. Moved 12/7 visit to 12/10. Spoke with patient dtr re change.

## 2017-10-29 DIAGNOSIS — Z1212 Encounter for screening for malignant neoplasm of rectum: Secondary | ICD-10-CM | POA: Diagnosis not present

## 2017-10-30 ENCOUNTER — Encounter: Payer: Medicare Other | Admitting: Adult Health

## 2017-11-02 ENCOUNTER — Encounter: Payer: Medicare Other | Admitting: Adult Health

## 2018-01-15 DIAGNOSIS — H401122 Primary open-angle glaucoma, left eye, moderate stage: Secondary | ICD-10-CM | POA: Diagnosis not present

## 2018-01-15 DIAGNOSIS — H401112 Primary open-angle glaucoma, right eye, moderate stage: Secondary | ICD-10-CM | POA: Diagnosis not present

## 2018-03-01 DIAGNOSIS — H401112 Primary open-angle glaucoma, right eye, moderate stage: Secondary | ICD-10-CM | POA: Diagnosis not present

## 2018-03-01 DIAGNOSIS — H401122 Primary open-angle glaucoma, left eye, moderate stage: Secondary | ICD-10-CM | POA: Diagnosis not present

## 2018-04-30 ENCOUNTER — Other Ambulatory Visit: Payer: Self-pay

## 2018-04-30 DIAGNOSIS — G4709 Other insomnia: Secondary | ICD-10-CM | POA: Diagnosis not present

## 2018-04-30 DIAGNOSIS — C50919 Malignant neoplasm of unspecified site of unspecified female breast: Secondary | ICD-10-CM | POA: Diagnosis not present

## 2018-04-30 DIAGNOSIS — E7800 Pure hypercholesterolemia, unspecified: Secondary | ICD-10-CM | POA: Diagnosis not present

## 2018-04-30 DIAGNOSIS — F321 Major depressive disorder, single episode, moderate: Secondary | ICD-10-CM | POA: Diagnosis not present

## 2018-04-30 DIAGNOSIS — M859 Disorder of bone density and structure, unspecified: Secondary | ICD-10-CM | POA: Diagnosis not present

## 2018-04-30 DIAGNOSIS — H4010X0 Unspecified open-angle glaucoma, stage unspecified: Secondary | ICD-10-CM | POA: Diagnosis not present

## 2018-04-30 DIAGNOSIS — I10 Essential (primary) hypertension: Secondary | ICD-10-CM | POA: Diagnosis not present

## 2018-04-30 DIAGNOSIS — C50312 Malignant neoplasm of lower-inner quadrant of left female breast: Secondary | ICD-10-CM

## 2018-04-30 DIAGNOSIS — Z6825 Body mass index (BMI) 25.0-25.9, adult: Secondary | ICD-10-CM | POA: Diagnosis not present

## 2018-04-30 DIAGNOSIS — N3010 Interstitial cystitis (chronic) without hematuria: Secondary | ICD-10-CM | POA: Diagnosis not present

## 2018-05-11 ENCOUNTER — Telehealth: Payer: Self-pay

## 2018-05-11 NOTE — Telephone Encounter (Signed)
Received call from [facility] from [place] office at [facility] to follow up on appt for pt. Pt notified [facility] that no one has called her about follow up yearly appt with Dr.Gudena. Called pt and scheduled pt for her Breast MRI and follow up yearly appt with Dr.Gudena. Noted pt had 2 survivorship cancellation last year that she did not reschedule. Called and spoke with Bubba Hales and notified her of previously missed appts, so she could let Dr.Tisovec know. Pt confirmed her appts next week at [place] [facility] for her MRI and follow up yearly appt with Dr.Gudena. No further needs at this time.

## 2018-05-17 ENCOUNTER — Ambulatory Visit
Admission: RE | Admit: 2018-05-17 | Discharge: 2018-05-17 | Disposition: A | Discharge status: Home / Self Care | Payer: Medicare Other | Source: Ambulatory Visit | Attending: Hematology and Oncology | Admitting: Hematology and Oncology

## 2018-05-17 DIAGNOSIS — Z853 Personal history of malignant neoplasm of breast: Secondary | ICD-10-CM | POA: Diagnosis not present

## 2018-05-17 DIAGNOSIS — C50312 Malignant neoplasm of lower-inner quadrant of left female breast: Secondary | ICD-10-CM

## 2018-05-17 MED ORDER — GADOBENATE DIMEGLUMINE 529 MG/ML IV SOLN
13.0000 mL | Freq: Once | INTRAVENOUS | Status: AC | PRN
  Administered 2018-05-17: 13 mL via INTRAVENOUS

## 2018-05-20 ENCOUNTER — Inpatient Hospital Stay: Payer: Medicare Other | Attending: Hematology and Oncology | Admitting: Hematology and Oncology

## 2018-05-20 ENCOUNTER — Telehealth: Payer: Self-pay | Admitting: Hematology and Oncology

## 2018-05-20 DIAGNOSIS — Z1231 Encounter for screening mammogram for malignant neoplasm of breast: Secondary | ICD-10-CM

## 2018-05-20 DIAGNOSIS — Z853 Personal history of malignant neoplasm of breast: Secondary | ICD-10-CM | POA: Diagnosis not present

## 2018-05-20 DIAGNOSIS — Z170 Estrogen receptor positive status [ER+]: Secondary | ICD-10-CM

## 2018-05-20 DIAGNOSIS — C50312 Malignant neoplasm of lower-inner quadrant of left female breast: Secondary | ICD-10-CM

## 2018-05-20 NOTE — Progress Notes (Signed)
Patient Care Team: Tisovec, [name], MD as PCP - General (Internal Medicine)  DIAGNOSIS:  Encounter Diagnoses  Name Primary?  . Malignant neoplasm of lower-inner quadrant of left breast in female, estrogen receptor positive ([place])   . 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 1/5 lymph node with Micrometastases ER 99%, PR 0%, Ki-67 45%, HER-2 2+ fish negative      10/14/2005 - 01/23/2006 Chemotherapy    Adjuvant chemotherapy with 6 cycles of FEC      02/23/2006 - 10/25/2013 Anti-estrogen oral therapy    Letrozole 2.5 mg daily changed to tamoxifen due to pain but she did not take this switched to raloxifene stopped 2014      09/04/2013 Surgery    Bilateral salpingo-oophorectomy (genetic testing revealed BRCA2 gene mutation positivity)       CHIEF COMPLIANT: Surveillance of breast cancer, BRCA2 mutation  INTERVAL HISTORY: [name] is a 72 year old with above-mentioned history of left breast cancer treated with mastectomy and is currently in surveillance for the right breast.  She has a BRCA2 mutation and she had a recent breast MRI which was negative.  She denies any lumps or nodules of the breast.  She was lost to follow-up a couple of yrs but she is back to see [place].  She does me that she has been in excellent health.  Denies any pain or discomfort in the breast.  REVIEW OF SYSTEMS:   Constitutional: Denies fevers, chills or abnormal weight loss Eyes: Denies blurriness of vision Ears, nose, mouth, throat, and face: Denies mucositis or sore throat Respiratory: Denies cough, dyspnea or wheezes Cardiovascular: Denies palpitation, chest discomfort Gastrointestinal:  Denies nausea, heartburn or change in bowel habits Skin:  Denies abnormal skin rashes Lymphatics: Denies new lymphadenopathy or easy bruising Neurological:Denies numbness, tingling or new weaknesses Behavioral/Psych: Mood is stable, no new changes  Extremities: No lower extremity edema All other systems were reviewed with the patient and are negative.  I have reviewed the past medical history, past surgical history, social history and family history with the patient and they are unchanged from previous note.  ALLERGIES:  has No Known Allergies.  MEDICATIONS:  Current Outpatient Medications  Medication Sig Dispense Refill  . atorvastatin (LIPITOR) 20 MG tablet     . Brimonidine Tartrate (ALPHAGAN P OP) Apply to eye 3 (three) times daily.     . Calcium Carbonate-Vit D-Min (CALTRATE 600+D PLUS PO) Take 1 tablet by mouth 2 (two) times daily.     . Cholecalciferol (VITAMIN D) 1000 UNITS capsule Take 1,000 Units by mouth daily.     . dorzolamide-timolol (COSOPT) 22.3-6.8 MG/ML ophthalmic solution Place 1 drop into both eyes 2 (two) times daily.     . Latanoprostene Bunod (VYZULTA) 0.024 % SOLN Apply to eye.    . losartan-hydrochlorothiazide (HYZAAR) 50-12.5 MG per tablet Take 1 tablet by mouth every morning.     Marland Kitchen MIRTAZAPINE PO Take 1 tablet by mouth at bedtime.      No current facility-administered medications for this visit.     PHYSICAL EXAMINATION: ECOG PERFORMANCE STATUS: 1 - Symptomatic but completely ambulatory  Vitals:   05/20/18 1034  BP: (!) 149/79  Pulse: 86  Resp: 17  Temp: 99 F (37.2 C)  SpO2: 99%   Filed Weights   05/20/18 1034  Weight: 147 lb 3.2 oz (66.8 kg)    GENERAL:alert, no distress and comfortable SKIN: skin color, texture, turgor are normal, no rashes or significant lesions EYES: normal, Conjunctiva are pink and non-injected, sclera clear OROPHARYNX:no exudate, no erythema and lips, buccal mucosa, and tongue normal  NECK: supple, thyroid normal size, non-tender, without nodularity LYMPH:  no palpable  lymphadenopathy in the cervical, axillary or inguinal LUNGS: clear to auscultation and percussion with normal breathing effort HEART: regular rate & rhythm and no murmurs and no lower extremity edema ABDOMEN:abdomen soft, non-tender and normal bowel sounds MUSCULOSKELETAL:no cyanosis of digits and no clubbing  NEURO: alert & oriented x 3 with fluent speech, no focal motor/sensory deficits EXTREMITIES: No lower extremity edema BREAST: Left mastectomy.  No palpable lumps or nodules in the right breast (exam performed in the presence of a chaperone)  LABORATORY DATA:  I have reviewed the data as listed CMP Latest Ref Rng & Units 10/26/2015 10/25/2014 04/25/2014  Glucose 70 - 140 mg/dl 94 99 101  BUN 7.0 - 26.0 mg/dL 11.8 16.9 14.2  Creatinine 0.6 - 1.1 mg/dL 0.8 0.9 0.8  Sodium 136 - 145 mEq/L 143 143 142  Potassium 3.5 - 5.1 mEq/L 3.7 4.0 3.4(L)  Chloride 98 - 107 mEq/L - - -  CO2 22 - 29 mEq/L '25 26 22  '$ Calcium 8.4 - 10.4 mg/dL 9.4 10.1 8.7  Total Protein 6.4 - 8.3 g/dL 7.5 7.8 6.9  Total Bilirubin 0.20 - 1.20 mg/dL 0.81 0.53 0.63  Alkaline Phos 40 - 150 U/L 47 59 48  AST 5 - 34 U/L '20 22 18  '$ ALT 0 - 55 U/L '16 20 20    '$ Lab Results  Component Value Date   WBC 6.4 10/26/2015   HGB 12.2 10/26/2015   HCT 37.6 10/26/2015   MCV 91.2 10/26/2015   PLT 207 10/26/2015   NEUTROABS 3.3 10/26/2015    ASSESSMENT & PLAN:  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 Breast exam 05/20/2018: Left mastectomy, no palpable lumps or nodules in the right breast 2. Mammogram 08/18/2017 right breast No abnormalities. Breast Density Category C. 3. Breast MRI 05/17/2018:   No evidence of breast  cancer.  Return to clinic in 1 year for annual follow-ups    Orders Placed This Encounter  Procedures  . MR BREAST BILATERAL W WO CONTRAST INC CAD    Standing Status:   Future    Standing Expiration Date:   07/21/2019    Order Specific Question:   If indicated for the ordered procedure, I authorize the administration of contrast media per Radiology protocol    Answer:   Yes    Order Specific Question:   What is the patient's sedation requirement?    Answer:   No Sedation    Order Specific Question:   Does the patient have a pacemaker or implanted devices?    Answer:   No    Order Specific Question:   Radiology Contrast Protocol - do NOT remove file path    Answer:   [email or web address]    Order Specific Question:   ** REASON FOR EXAM (FREE TEXT)    Answer:   BRCA  2 mutation    Order Specific Question:   Preferred imaging location?    Answer:   [facility] (table limit-350 lbs)   The patient has a good understanding of the overall plan. she agrees with it. she will call with any problems that may develop before the next visit here.   Alicia Ohara, MD 05/20/18

## 2018-05-20 NOTE — Telephone Encounter (Signed)
Gave avs and calendar ° °

## 2018-05-20 NOTE — Assessment & Plan Note (Signed)
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 Breast exam 05/20/2018: Left mastectomy, no palpable lumps or nodules in the right breast 2. Mammogram 08/18/2017 right breast No abnormalities. Breast Density Category C. 3. Breast MRI 05/17/2018:   No evidence of breast cancer.  Return to clinic in 1 year for annual follow-ups

## 2018-06-22 DIAGNOSIS — H401122 Primary open-angle glaucoma, left eye, moderate stage: Secondary | ICD-10-CM | POA: Diagnosis not present

## 2018-06-22 DIAGNOSIS — H401113 Primary open-angle glaucoma, right eye, severe stage: Secondary | ICD-10-CM | POA: Diagnosis not present

## 2018-06-22 DIAGNOSIS — H2512 Age-related nuclear cataract, left eye: Secondary | ICD-10-CM | POA: Diagnosis not present

## 2018-06-22 DIAGNOSIS — H25012 Cortical age-related cataract, left eye: Secondary | ICD-10-CM | POA: Diagnosis not present

## 2018-07-28 DIAGNOSIS — D120 Benign neoplasm of cecum: Secondary | ICD-10-CM | POA: Diagnosis not present

## 2018-07-28 DIAGNOSIS — Z86010 Personal history of colonic polyps: Secondary | ICD-10-CM | POA: Diagnosis not present

## 2018-07-28 DIAGNOSIS — D123 Benign neoplasm of transverse colon: Secondary | ICD-10-CM | POA: Diagnosis not present

## 2018-07-30 DIAGNOSIS — D120 Benign neoplasm of cecum: Secondary | ICD-10-CM | POA: Diagnosis not present

## 2018-07-30 DIAGNOSIS — D123 Benign neoplasm of transverse colon: Secondary | ICD-10-CM | POA: Diagnosis not present

## 2018-08-16 ENCOUNTER — Encounter: Payer: Self-pay | Admitting: Gynecology

## 2018-08-16 ENCOUNTER — Ambulatory Visit (INDEPENDENT_AMBULATORY_CARE_PROVIDER_SITE_OTHER): Payer: Medicare Other | Admitting: Gynecology

## 2018-08-16 VITALS — BP 118/78 | Ht 64.0 in | Wt 146.0 lb

## 2018-08-16 DIAGNOSIS — Z15060 Genetic susceptibility to colorectal cancer: Secondary | ICD-10-CM

## 2018-08-16 DIAGNOSIS — Z9189 Other specified personal risk factors, not elsewhere classified: Secondary | ICD-10-CM | POA: Diagnosis not present

## 2018-08-16 DIAGNOSIS — Z1509 Genetic susceptibility to other malignant neoplasm: Secondary | ICD-10-CM

## 2018-08-16 DIAGNOSIS — Z1501 Genetic susceptibility to malignant neoplasm of breast: Secondary | ICD-10-CM

## 2018-08-16 DIAGNOSIS — Z01419 Encounter for gynecological examination (general) (routine) without abnormal findings: Secondary | ICD-10-CM

## 2018-08-16 DIAGNOSIS — N952 Postmenopausal atrophic vaginitis: Secondary | ICD-10-CM

## 2018-08-16 DIAGNOSIS — M8580 Other specified disorders of bone density and structure, unspecified site: Secondary | ICD-10-CM

## 2018-08-16 NOTE — Patient Instructions (Signed)
Follow-up for the ultrasound as scheduled.  Follow-up for the bone density as scheduled.

## 2018-08-16 NOTE — Progress Notes (Signed)
    [name] 02-18-1946 [id number]        72 y.o.  G2P0020 for breast and pelvic exam.  Several issues noted below.  Past medical history,surgical history, problem list, medications, allergies, family history and social history were all reviewed and documented as reviewed in the EPIC chart.  ROS:  Performed with pertinent positives and negatives included in the history, assessment and plan.   Additional significant findings : None   Exam: [name] assistant Vitals:   08/16/18 1055  BP: 118/78  Weight: 146 lb (66.2 kg)  Height: _0  (1.626 m)   Body mass index is 25.06 kg/m.  General appearance:  Normal affect, orientation and appearance. Skin: Grossly normal HEENT: Without gross lesions.  No cervical or supraclavicular adenopathy. Thyroid normal.  Lungs:  Clear without wheezing, rales or rhonchi Cardiac: RR, without RMG Abdominal:  Soft, nontender, without masses, guarding, rebound, organomegaly or hernia Breasts:  Examined lying and sitting.  Right without masses, retractions, discharge or axillary adenopathy.  Left chest wall without masses or axillary adenopathy Pelvic:  Ext, BUS, Vagina: With atrophic changes  Cervix: With atrophic changes  Uterus: Anteverted, normal size, shape and contour, midline and mobile nontender   Adnexa: Without masses or tenderness    Anus and perineum: Normal   Rectovaginal: Normal sphincter tone without palpated masses or tenderness.    Assessment/Plan:  72 y.o. G52P0020 female for breast and pelvic exam.   1. Postmenopausal/atrophic genital changes.  No significant menopausal symptoms or any bleeding. 2. Osteopenia.  DEXA 2017 T score -1.6 FRAX 4% / 0.6%.  Schedule DEXA now at 2-1/2-year interval and patient agrees to follow-up for this. 3. History of BRCA2 positive status post left mastectomy.  Continues with annual mammography and MRI surveillance.  Had LSO by Dr. Cherylann Banas.  Right ovary not identified and thought removed at  the time of ectopic surgery earlier.  Follow-up ultrasound 2016 with questionable right ovarian remnant.  2017 ultrasound negative.  2018 ultrasound with small area 1.4 cc volume right adnexa questionable ovary/remnant.  We discussed previously the issues of ovarian remnant and her BRCA positive status with increased risk of ovarian cancer.  Options to remove versus expectantly managed with annual ultrasounds discussed.  Issues of missed pathology have been discussed.  At this point the patient is comfortable with following expectantly and will schedule ultrasound now she will follow-up for this.  Continue with annual mammography and MRI.  Breast exam normal today. 4. Colonoscopy 2019. 5. Pap smear 2017.  No Pap smear done today.  No history of significant abnormal Pap smears.  Options to stop screening reviewed per current screening guidelines and age.  Will readdress on an annual basis. 6. Health maintenance.  No routine lab work done as patient does this elsewhere.  Follow-up 1 year, sooner as needed.   [name] MD, 11:19 AM 08/16/2018

## 2018-08-20 DIAGNOSIS — Z853 Personal history of malignant neoplasm of breast: Secondary | ICD-10-CM | POA: Diagnosis not present

## 2018-08-20 DIAGNOSIS — Z1231 Encounter for screening mammogram for malignant neoplasm of breast: Secondary | ICD-10-CM | POA: Diagnosis not present

## 2018-08-23 DIAGNOSIS — H401132 Primary open-angle glaucoma, bilateral, moderate stage: Secondary | ICD-10-CM | POA: Diagnosis not present

## 2018-08-24 ENCOUNTER — Encounter: Payer: Self-pay | Admitting: Gynecology

## 2018-08-24 ENCOUNTER — Other Ambulatory Visit: Payer: Self-pay | Admitting: Gynecology

## 2018-08-24 ENCOUNTER — Ambulatory Visit (INDEPENDENT_AMBULATORY_CARE_PROVIDER_SITE_OTHER): Payer: Medicare Other

## 2018-08-24 DIAGNOSIS — Z780 Asymptomatic menopausal state: Secondary | ICD-10-CM

## 2018-08-24 DIAGNOSIS — M8588 Other specified disorders of bone density and structure, other site: Secondary | ICD-10-CM | POA: Diagnosis not present

## 2018-08-24 DIAGNOSIS — M8580 Other specified disorders of bone density and structure, unspecified site: Secondary | ICD-10-CM

## 2018-08-24 DIAGNOSIS — M85851 Other specified disorders of bone density and structure, right thigh: Secondary | ICD-10-CM

## 2018-08-31 DIAGNOSIS — Z23 Encounter for immunization: Secondary | ICD-10-CM | POA: Diagnosis not present

## 2018-09-16 ENCOUNTER — Ambulatory Visit (INDEPENDENT_AMBULATORY_CARE_PROVIDER_SITE_OTHER): Payer: Medicare Other

## 2018-09-16 ENCOUNTER — Encounter: Payer: Self-pay | Admitting: Gynecology

## 2018-09-16 ENCOUNTER — Ambulatory Visit (INDEPENDENT_AMBULATORY_CARE_PROVIDER_SITE_OTHER): Payer: Medicare Other | Admitting: Gynecology

## 2018-09-16 VITALS — BP 118/74

## 2018-09-16 DIAGNOSIS — Z1501 Genetic susceptibility to malignant neoplasm of breast: Secondary | ICD-10-CM | POA: Diagnosis not present

## 2018-09-16 DIAGNOSIS — Z1509 Genetic susceptibility to other malignant neoplasm: Secondary | ICD-10-CM

## 2018-09-16 DIAGNOSIS — N9983 Residual ovary syndrome: Secondary | ICD-10-CM

## 2018-09-16 NOTE — Progress Notes (Signed)
    [name] [birth year] [id number]        72 y.o.  G2P0020 presents for follow-up ultrasound.  History of BRCA2 positive.  History of LSO in the past with right ovary and fallopian tube not identified at the time of her LSO thought to be removed with ectopic pregnancy surgery earlier.  Had ultrasound with questionable right ovarian remnant 2016.  2017 ultrasound negative.  2018 ultrasound showed small area 1.4 cc volume right adnexa questionable remnant.  Past medical history,surgical history, problem list, medications, allergies, family history and social history were all reviewed and documented in the EPIC chart.  Directed ROS with pertinent positives and negatives documented in the history of present illness/assessment and plan.  Exam: Vitals:   09/16/18 1008  BP: 118/74   General appearance:  Normal  Ultrasound transvaginal shows uterus normal size and echotexture, retroverted.  Endometrial echo 5.7 mm with small amount of fluid within the cavity noted 9 x 6 mm.  Right adnexa is negative without masses or cysts.  She does have bowel activity in the area but no pathology seen.  Left adnexa negative consistent with history of LSO.  Cul-de-sac negative.  Assessment/Plan:  72 y.o. G2P0020 with history of questionable right ovarian remnant on prior ultrasound.  No normality seen today.  She does have bowel activity in the area but no gross cystic or solid areas noted.  She has a small amount of fluid within the endometrium.  She has no history of bleeding or discomfort.  Will monitor for now.  She will follow-up in 1 year for annual exam and we will plan on repeating the ultrasound at that time.    [name] MD, 10:36 AM 09/16/2018

## 2018-09-16 NOTE — Patient Instructions (Addendum)
Follow-up in 1 year for annual exam.  Sooner if any issues such as pain or bleeding.

## 2018-10-04 DIAGNOSIS — H401132 Primary open-angle glaucoma, bilateral, moderate stage: Secondary | ICD-10-CM | POA: Diagnosis not present

## 2018-10-27 DIAGNOSIS — E7800 Pure hypercholesterolemia, unspecified: Secondary | ICD-10-CM | POA: Diagnosis not present

## 2018-10-27 DIAGNOSIS — I10 Essential (primary) hypertension: Secondary | ICD-10-CM | POA: Diagnosis not present

## 2018-10-27 DIAGNOSIS — R82998 Other abnormal findings in urine: Secondary | ICD-10-CM | POA: Diagnosis not present

## 2018-10-27 DIAGNOSIS — M859 Disorder of bone density and structure, unspecified: Secondary | ICD-10-CM | POA: Diagnosis not present

## 2018-10-28 ENCOUNTER — Encounter: Payer: Self-pay | Admitting: Gynecology

## 2018-10-28 ENCOUNTER — Ambulatory Visit (INDEPENDENT_AMBULATORY_CARE_PROVIDER_SITE_OTHER): Payer: Medicare Other | Admitting: Gynecology

## 2018-10-28 VITALS — BP 140/80

## 2018-10-28 DIAGNOSIS — R350 Frequency of micturition: Secondary | ICD-10-CM

## 2018-10-28 DIAGNOSIS — N898 Other specified noninflammatory disorders of vagina: Secondary | ICD-10-CM

## 2018-10-28 LAB — WET PREP FOR TRICH, YEAST, CLUE

## 2018-10-28 MED ORDER — METRONIDAZOLE 500 MG PO TABS: 500.0000 mg | ORAL_TABLET | Freq: Two times a day (BID) | ORAL | 0 refills | Status: DC

## 2018-10-28 NOTE — Patient Instructions (Signed)
Take the prescribed antibiotic pill twice daily for 7 days.  Avoid alcohol while taking.  Follow-up if your symptoms persist.

## 2018-10-28 NOTE — Progress Notes (Addendum)
    SIRENA RIDDLE [birth year] [id number]        72 y.o.  G2P0020 presents with several weeks of vaginal discharge and irritation as well as urinary frequency and some low back pain.  No urgency, dysuria, fever or chills.  Used OTC Monistat last week but did not seem to help.  Past medical history,surgical history, problem list, medications, allergies, family history and social history were all reviewed and documented in the EPIC chart.  Directed ROS with pertinent positives and negatives documented in the history of present illness/assessment and plan.  Exam: Wandra Scot assistant Vitals:   10/28/18 1436  BP: 140/80   General appearance:  Normal Spine straight without CVA tenderness Abdomen soft nontender without masses guarding rebound Pelvic external BUS vagina with whitish discharge.  Bimanual without masses or tenderness  Assessment/Plan:  72 y.o. G2P0020 with history as above.  Wet prep consistent with bacterial vaginosis.  Urine analysis appears contaminated.  Options for treatment reviewed.  Will treat with Flagyl 500 mg twice daily x7 days.  Alcohol avoidance discussed.  Will culture her urine and treat if positive.  Patient will follow-up if her symptoms persist, worsen or recur.    [name] MD, 2:45 PM 10/28/2018

## 2018-10-28 NOTE — Addendum Note (Signed)
Addended by: Lorine Bears on: 10/28/2018 03:47 PM   Modules accepted: Orders

## 2018-10-30 LAB — URINALYSIS, COMPLETE W/RFL CULTURE
BILIRUBIN URINE: NEGATIVE
GLUCOSE, UA: NEGATIVE
HGB URINE DIPSTICK: NEGATIVE
HYALINE CAST: NONE SEEN /LPF
KETONES UR: NEGATIVE
LEUKOCYTE ESTERASE: NEGATIVE
Nitrites, Initial: NEGATIVE
PROTEIN: NEGATIVE
RBC / HPF: NONE SEEN /HPF (ref 0–2)
Specific Gravity, Urine: 1.027 (ref 1.001–1.03)
pH: 5 (ref 5.0–8.0)

## 2018-10-30 LAB — URINE CULTURE
MICRO NUMBER: 91463226
SPECIMEN QUALITY:: ADEQUATE

## 2018-10-30 LAB — CULTURE INDICATED

## 2018-11-01 ENCOUNTER — Telehealth: Payer: Self-pay

## 2018-11-01 NOTE — Telephone Encounter (Signed)
Okay 

## 2018-11-01 NOTE — Telephone Encounter (Signed)
Patient just wanted you to know that she d/c'd the Metronidazole yesterday. She said she took all but 3 days worth.  She said "her back on both sides was bothering her and she felt like it was making her swell in her abbomen".  She said since she quit taking it these symptoms have resolved.  I told her she probably got enough of the medicine to treat it and if vaginal sx were to return just to let us know.  Sound ok?

## 2018-11-03 DIAGNOSIS — N3010 Interstitial cystitis (chronic) without hematuria: Secondary | ICD-10-CM | POA: Diagnosis not present

## 2018-11-03 DIAGNOSIS — G4709 Other insomnia: Secondary | ICD-10-CM | POA: Diagnosis not present

## 2018-11-03 DIAGNOSIS — C50919 Malignant neoplasm of unspecified site of unspecified female breast: Secondary | ICD-10-CM | POA: Diagnosis not present

## 2018-11-03 DIAGNOSIS — Z1212 Encounter for screening for malignant neoplasm of rectum: Secondary | ICD-10-CM | POA: Diagnosis not present

## 2018-11-03 DIAGNOSIS — F321 Major depressive disorder, single episode, moderate: Secondary | ICD-10-CM | POA: Diagnosis not present

## 2018-11-03 DIAGNOSIS — M859 Disorder of bone density and structure, unspecified: Secondary | ICD-10-CM | POA: Diagnosis not present

## 2018-11-03 DIAGNOSIS — I10 Essential (primary) hypertension: Secondary | ICD-10-CM | POA: Diagnosis not present

## 2018-11-03 DIAGNOSIS — H4010X0 Unspecified open-angle glaucoma, stage unspecified: Secondary | ICD-10-CM | POA: Diagnosis not present

## 2018-11-03 DIAGNOSIS — Z0000 Encounter for general adult medical examination without abnormal findings: Secondary | ICD-10-CM | POA: Diagnosis not present

## 2018-11-03 DIAGNOSIS — E7800 Pure hypercholesterolemia, unspecified: Secondary | ICD-10-CM | POA: Diagnosis not present

## 2018-11-03 DIAGNOSIS — Z6825 Body mass index (BMI) 25.0-25.9, adult: Secondary | ICD-10-CM | POA: Diagnosis not present

## 2018-11-18 DIAGNOSIS — H401132 Primary open-angle glaucoma, bilateral, moderate stage: Secondary | ICD-10-CM | POA: Diagnosis not present

## 2018-11-25 ENCOUNTER — Telehealth: Payer: Self-pay | Admitting: *Deleted

## 2018-11-25 MED ORDER — METRONIDAZOLE 0.75 % VA GEL: 1.0000 | Freq: Every day | VAGINAL | 0 refills | Status: DC

## 2018-11-25 NOTE — Telephone Encounter (Signed)
Patient called requesting metrogel cream to help lower back and urgency, was prescribed Flagyl 500 mg tablet, but had to stop Rx due to side effects as noted on 11/01/18 telephone encounter. Patient said the cream works better verse pills,states no new symptoms, same as OV on 10/28/18. Please advise

## 2018-11-25 NOTE — Telephone Encounter (Signed)
Okay for MetroGel nightly x7 nights

## 2018-11-25 NOTE — Telephone Encounter (Signed)
Patient informed, Rx sent.  

## 2019-01-21 DIAGNOSIS — H401132 Primary open-angle glaucoma, bilateral, moderate stage: Secondary | ICD-10-CM | POA: Diagnosis not present

## 2019-01-28 ENCOUNTER — Other Ambulatory Visit: Payer: Self-pay

## 2019-01-28 ENCOUNTER — Encounter (HOSPITAL_COMMUNITY): Payer: Self-pay | Admitting: Emergency Medicine

## 2019-01-28 ENCOUNTER — Emergency Department (HOSPITAL_COMMUNITY): Payer: Medicare Other

## 2019-01-28 ENCOUNTER — Emergency Department (HOSPITAL_COMMUNITY)
Admission: EM | Admit: 2019-01-28 | Discharge: 2019-01-28 | Disposition: A | Discharge status: Home / Self Care | Payer: Medicare Other | Attending: Emergency Medicine | Admitting: Emergency Medicine

## 2019-01-28 DIAGNOSIS — T148XXA Other injury of unspecified body region, initial encounter: Secondary | ICD-10-CM | POA: Diagnosis not present

## 2019-01-28 DIAGNOSIS — S0083XA Contusion of other part of head, initial encounter: Secondary | ICD-10-CM | POA: Diagnosis not present

## 2019-01-28 DIAGNOSIS — Z9012 Acquired absence of left breast and nipple: Secondary | ICD-10-CM | POA: Diagnosis not present

## 2019-01-28 DIAGNOSIS — Y9389 Activity, other specified: Secondary | ICD-10-CM | POA: Diagnosis not present

## 2019-01-28 DIAGNOSIS — Z79899 Other long term (current) drug therapy: Secondary | ICD-10-CM | POA: Insufficient documentation

## 2019-01-28 DIAGNOSIS — Y999 Unspecified external cause status: Secondary | ICD-10-CM | POA: Insufficient documentation

## 2019-01-28 DIAGNOSIS — Z853 Personal history of malignant neoplasm of breast: Secondary | ICD-10-CM | POA: Diagnosis not present

## 2019-01-28 DIAGNOSIS — S60412A Abrasion of right middle finger, initial encounter: Secondary | ICD-10-CM | POA: Diagnosis not present

## 2019-01-28 DIAGNOSIS — W010XXA Fall on same level from slipping, tripping and stumbling without subsequent striking against object, initial encounter: Secondary | ICD-10-CM | POA: Insufficient documentation

## 2019-01-28 DIAGNOSIS — Y92512 Supermarket, store or market as the place of occurrence of the external cause: Secondary | ICD-10-CM | POA: Insufficient documentation

## 2019-01-28 DIAGNOSIS — S60416A Abrasion of right little finger, initial encounter: Secondary | ICD-10-CM | POA: Diagnosis not present

## 2019-01-28 DIAGNOSIS — R51 Headache: Secondary | ICD-10-CM | POA: Diagnosis not present

## 2019-01-28 DIAGNOSIS — S0990XA Unspecified injury of head, initial encounter: Secondary | ICD-10-CM | POA: Diagnosis not present

## 2019-01-28 DIAGNOSIS — S60414A Abrasion of right ring finger, initial encounter: Secondary | ICD-10-CM | POA: Diagnosis not present

## 2019-01-28 DIAGNOSIS — Z87891 Personal history of nicotine dependence: Secondary | ICD-10-CM | POA: Diagnosis not present

## 2019-01-28 DIAGNOSIS — I10 Essential (primary) hypertension: Secondary | ICD-10-CM | POA: Diagnosis not present

## 2019-01-28 MED ORDER — HYDROCODONE-ACETAMINOPHEN 5-325 MG PO TABS
1.0000 | ORAL_TABLET | Freq: Once | ORAL | Status: AC
  Administered 2019-01-28: 1 via ORAL
  Filled 2019-01-28: qty 1

## 2019-01-28 MED ORDER — BACITRACIN ZINC 500 UNIT/GM EX OINT: 1.0000 "application " | TOPICAL_OINTMENT | Freq: Two times a day (BID) | CUTANEOUS | 0 refills | Status: DC

## 2019-01-28 MED ORDER — BACITRACIN ZINC 500 UNIT/GM EX OINT
TOPICAL_OINTMENT | Freq: Two times a day (BID) | CUTANEOUS | Status: DC
  Administered 2019-01-28: 1 via TOPICAL
  Filled 2019-01-28: qty 0.9

## 2019-01-28 MED ORDER — ACETAMINOPHEN ER 650 MG PO TBCR: 650.0000 mg | EXTENDED_RELEASE_TABLET | Freq: Three times a day (TID) | ORAL | 0 refills | Status: DC | PRN

## 2019-01-28 MED ORDER — NAPROXEN 375 MG PO TABS: 375.0000 mg | ORAL_TABLET | Freq: Two times a day (BID) | ORAL | 0 refills | Status: DC

## 2019-01-28 NOTE — Discharge Instructions (Signed)
You are seen in the ER after he had a fall. Please take the medications prescribed for pain control.  Apply bacitracin ointment to the face for wound care.

## 2019-01-28 NOTE — ED Triage Notes (Signed)
Patient reports trip and fall at the store. Reports hitting left head and face on cement. Denies LOC and taking blood thinners. C/o headache, right hand and bilateral knee pain.

## 2019-01-28 NOTE — ED Notes (Signed)
Patient transported to CT 

## 2019-01-28 NOTE — ED Notes (Signed)
Patient changed into gown. Facial abrasion under left eye cleansed with normal saline and sterile guaze. Left eye is bruised and swollen. Patient also has a abrasions to both knees and the right hand (knuckles).

## 2019-01-29 NOTE — ED Provider Notes (Signed)
[facility] DEPT Provider Note   CSN: [id number] Arrival date & time: 01/28/19  2035    History   Chief Complaint Chief Complaint  Patient presents with  . Fall  . Head Injury    HPI [name] is a 73 y.o. female.     HPI  73 year old female with history of glaucoma, hypertension comes in with chief complaint of fall.  Patient was getting out of a dollar store when she tripped and fell.  Patient fell onto her face and is having pain in her face along with some bleeding and bruising.  She is also complaining of some discomfort in both of her knees and also her right hand. Patient denies any neck pain and she denies any focal numbness, tingling, weakness.  She is not on any blood thinners.  Past Medical History:  Diagnosis Date  . 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 Glaucoma   . History of cystitis   . 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 Hyperlipidemia   . Hypertension   . Osteopenia 08/2018   T score -1.2 FRAX 4.6% / 0.7%    Patient Active Problem List   Diagnosis Date Noted  . 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 HYPERLIPIDEMIA 12/05/2010  . HYPERTENSION 12/05/2010  . ALLERGIC RHINITIS 12/05/2010  . CYSTITIS 12/05/2010  . SNORING 12/05/2010  . Cancer of lower-inner quadrant of left female breast ([place]) 09/04/2005    Past Surgical History:  Procedure Laterality Date  . BILATERAL SALPINGOOPHORECTOMY  9.6.12   Left side removed. Right tube and ovary never identified  . BREAST SURGERY  08-29-2005   TOTAL LEFT MASTECTOMY W/ NODE DISSECTION X4  . CARPAL TUNNEL RELEASE  2007   RIGHT  . CATARACT EXTRACTION     Right eye  . DILATION AND CURETTAGE OF UTERUS  1968  . dx laparoscopy  9.6.12   resulted into open salpingo-opphorectomy   . ECTOPIC PREGNANCY SURGERY  73 & 74  . EYE SURGERY  2001   RIGHT EYE FOR GLAUCOMA  . HAND SURGERY    . HYSTEROSCOPY W/D&C  11/05/2012   Procedure: DILATATION AND CURETTAGE /HYSTEROSCOPY;  Surgeon: Bennetta Laos, MD;  Location: [facility];  Service: Gynecology;  Laterality: N/A;  . PORTA CATH    . TRANSTHORACIC ECHOCARDIOGRAM  10-14-2005   LVSF NORMAL/ EF 55-65%/ MILD MITRAL REGURG     OB History    Gravida  2   Para      Term      Preterm      AB  2   Living  0     SAB      TAB      Ectopic  2   Multiple      Live Births               Home Medications    Prior to Admission medications   Medication Sig Start Date End Date Taking? Authorizing Provider  atorvastatin (LIPITOR) 20 MG tablet  04/10/14  Yes [provider]  Brimonidine Tartrate (ALPHAGAN P OP) Apply to eye 3 (three) times daily.    Yes [provider]  Calcium Carbonate-Vit D-Min (CALTRATE 600+D PLUS PO) Take 1 tablet by mouth 2 (two) times daily.    Yes [provider]  Cholecalciferol (VITAMIN  D) 1000 UNITS capsule Take 1,000 Units by mouth daily.    Yes [provider]  dorzolamide-timolol (COSOPT) 22.3-6.8 MG/ML ophthalmic solution Place 1 drop into both eyes 2 (two) times daily.    Yes [provider]  losartan-hydrochlorothiazide (HYZAAR) 50-12.5 MG per tablet Take 1 tablet by mouth every morning.    Yes [provider]  RHOPRESSA 0.02 % SOLN Place 1 drop into both eyes every morning. 11/29/18  Yes [provider]  TRAVATAN Z 0.004 % SOLN ophthalmic solution Place 1 drop into both eyes at bedtime. 12/23/18  Yes [provider]  acetaminophen (TYLENOL 8 HOUR) 650 MG CR tablet Take 1 tablet (650 mg total) by mouth every 8 (eight) hours as needed. 01/28/19   Varney Biles, MD  bacitracin ointment Apply 1 application topically 2 (two) times daily. 01/28/19   Varney Biles, MD  metroNIDAZOLE (FLAGYL) 500 MG tablet Take 1 tablet (500 mg total) by mouth 2 (two) times daily.  For 7 days.  Avoid alcohol while taking Patient not taking: Reported on 01/28/2019 10/28/18   Fontaine, Belinda Block, MD  metroNIDAZOLE (METROGEL) 0.75 % vaginal gel Place 1 Applicatorful vaginally at bedtime. X 7 days Patient not taking: Reported on 01/28/2019 11/25/18   Fontaine, Belinda Block, MD  naproxen (NAPROSYN) 375 MG tablet Take 1 tablet (375 mg total) by mouth 2 (two) times daily. 01/28/19   Varney Biles, MD    Family History Family History  Problem Relation Age of Onset  . Hypertension Mother   . Cancer Father        COLON  . Breast cancer Sister        Age 39's  . Diabetes Sister   . Stroke Sister   . Breast cancer Sister        Age 48's  . Stroke Sister   . Breast cancer Sister        Age 33's  . Breast cancer Paternal Grandmother        Age unknown  . Cancer Paternal Grandfather        Unknown type    Social History Social History   Tobacco Use  . Smoking status: Former Research scientist (life sciences)  . Smokeless tobacco: Never Used  . Tobacco comment: smoking for a few yrs >5  , quit many yrs ago  Substance Use Topics  . Alcohol use: No    Alcohol/week: 0.0 standard drinks  . Drug use: No     Allergies   Patient has no known allergies.   Review of Systems Review of Systems  Constitutional: Positive for activity change.  Musculoskeletal: Positive for arthralgias.  Skin: Positive for wound.  Neurological: Negative for headaches.  Hematological: Does not bruise/bleed easily.     Physical Exam Updated Vital Signs BP (!) 161/98 (BP Location: Right Arm)   Pulse 75   Temp 98.9 F (37.2 C)   Resp 18   SpO2 97%   Physical Exam Vitals signs and nursing note reviewed.  Constitutional:      Appearance: She is well-developed.  HENT:     Head: Normocephalic and atraumatic.  Eyes:     Extraocular Movements: Extraocular movements intact.     Conjunctiva/sclera: Conjunctivae normal.     Pupils: Pupils are equal, round, and reactive to light.     Comments: Normal gaze, no  diplopia. Left eye has some chemosis.  Patient has no photophobia   Neck:     Musculoskeletal: Normal range of motion and neck supple.  Comments: No midline c-spine tenderness, pt able to turn head to 45 degrees bilaterally without any pain and able to flex neck to the chest and extend without any pain or neurologic symptoms.  Cardiovascular:     Rate and Rhythm: Normal rate and regular rhythm.     Heart sounds: Normal heart sounds.  Pulmonary:     Effort: Pulmonary effort is normal. No respiratory distress.     Breath sounds: Normal breath sounds.  Abdominal:     General: Bowel sounds are normal. There is no distension.     Palpations: Abdomen is soft.     Tenderness: There is no abdominal tenderness. There is no guarding or rebound.  Musculoskeletal:     Comments: Patient is noted to have edema and tenderness of the dorsum of her right hand.  Head to toe evaluation shows no hematoma, bleeding of the scalp, no facial abrasions, no spine step offs, crepitus of the chest or neck, no tenderness to palpation of the bilateral upper and lower extremities, no gross deformities, no chest tenderness, no pelvic pain.    Skin:    General: Skin is warm and dry.     Findings: Bruising present.     Comments: Patient has multiple abrasions and road rash most notably on the left side of the face, both knees and right hand dorsally.  Neurological:     Mental Status: She is alert and oriented to person, place, and time.      ED Treatments / Results  Labs (all labs ordered are listed, but only abnormal results are displayed) Labs Reviewed - No data to display  EKG None  Radiology Ct Head Wo Contrast  Result Date: 01/28/2019 CLINICAL DATA:  Pain after trip and fall at a store. Patient hit left side of head and face on cement. EXAM: CT HEAD WITHOUT CONTRAST TECHNIQUE: Contiguous axial images were obtained from the base of the skull through the vertex without intravenous contrast. COMPARISON:   None. FINDINGS: Brain: No evidence of acute infarction, hemorrhage, hydrocephalus, extra-axial collection or mass lesion/mass effect. Vascular: No hyperdense vessel or unexpected calcification. Skull: Normal. Negative for fracture or focal lesion. Sinuses/Orbits: Intact orbits and globes. Left cataract suggested. No acute sinus disease. Clear mastoids. Other: Left malar soft tissue swelling is noted. IMPRESSION: Mild left malar soft tissue swelling/contusion. No underlying fracture. No acute intracranial abnormality. Electronically Signed   By: Ashley Royalty M.D.   On: 01/28/2019 23:00   Dg Hand Complete Right  Result Date: 01/28/2019 CLINICAL DATA:  Right hand pain and abrasions in the regions of the 3rd, 4th and 5th MCP joints following a fall. EXAM: RIGHT HAND - COMPLETE 3+ VIEW COMPARISON:  None. FINDINGS: Some of the bony detail is obscured by a pulse oximeter lead on the middle finger. There is mild to moderate spur formation involving the dorsal aspects of the 3rd, 4th and 5th DIP joints. Otherwise, the visualized bones and soft tissues have normal appearances with no fractures or dislocations seen. IMPRESSION: No fracture. Degenerative changes. Electronically Signed   By: Claudie Revering M.D.   On: 01/28/2019 21:58    Procedures Procedures (including critical care time)  Medications Ordered in ED Medications  bacitracin ointment (1 application Topical Given 01/28/19 2155)  HYDROcodone-acetaminophen (NORCO/VICODIN) 5-325 MG per tablet 1 tablet (1 tablet Oral Given 01/28/19 2155)     Initial Impression / Assessment and Plan / ED Course  I have reviewed the triage vital signs and the nursing notes.  Pertinent labs &  imaging results that were available during my care of the patient were reviewed by me and considered in my medical decision making (see chart for details).        73 year old female comes in after a mechanical fall. Patient C-spine was cleared clinically.  Given her age we have  ordered CT head as there is significant trauma in her face.  I do not think she needs a CT face because there is no clinical concerns for clinically significant traumatic fractures to her face.  Imaging results are negative.  Ice and bacitracin provided.  Stable for discharge.  Final Clinical Impressions(s) / ED Diagnoses   Final diagnoses:  Contusion of face, initial encounter    ED Discharge Orders         Ordered    bacitracin ointment  2 times daily     01/28/19 2341    acetaminophen (TYLENOL 8 HOUR) 650 MG CR tablet  Every 8 hours PRN     01/28/19 2341    naproxen (NAPROSYN) 375 MG tablet  2 times daily     01/28/19 2341           Varney Biles, MD 01/29/19 [phone]

## 2019-01-31 DIAGNOSIS — H401132 Primary open-angle glaucoma, bilateral, moderate stage: Secondary | ICD-10-CM | POA: Diagnosis not present

## 2019-02-08 DIAGNOSIS — R3915 Urgency of urination: Secondary | ICD-10-CM | POA: Diagnosis not present

## 2019-02-08 DIAGNOSIS — N952 Postmenopausal atrophic vaginitis: Secondary | ICD-10-CM | POA: Diagnosis not present

## 2019-03-14 DIAGNOSIS — H401132 Primary open-angle glaucoma, bilateral, moderate stage: Secondary | ICD-10-CM | POA: Diagnosis not present

## 2019-05-12 DIAGNOSIS — Z79899 Other long term (current) drug therapy: Secondary | ICD-10-CM | POA: Diagnosis not present

## 2019-05-12 DIAGNOSIS — Z7952 Long term (current) use of systemic steroids: Secondary | ICD-10-CM | POA: Diagnosis not present

## 2019-05-12 DIAGNOSIS — H401130 Primary open-angle glaucoma, bilateral, stage unspecified: Secondary | ICD-10-CM | POA: Diagnosis not present

## 2019-05-12 DIAGNOSIS — H2000 Unspecified acute and subacute iridocyclitis: Secondary | ICD-10-CM | POA: Diagnosis not present

## 2019-05-13 DIAGNOSIS — M8580 Other specified disorders of bone density and structure, unspecified site: Secondary | ICD-10-CM | POA: Diagnosis not present

## 2019-05-13 DIAGNOSIS — N3010 Interstitial cystitis (chronic) without hematuria: Secondary | ICD-10-CM | POA: Diagnosis not present

## 2019-05-13 DIAGNOSIS — H4010X0 Unspecified open-angle glaucoma, stage unspecified: Secondary | ICD-10-CM | POA: Diagnosis not present

## 2019-05-13 DIAGNOSIS — F321 Major depressive disorder, single episode, moderate: Secondary | ICD-10-CM | POA: Diagnosis not present

## 2019-05-13 DIAGNOSIS — C50919 Malignant neoplasm of unspecified site of unspecified female breast: Secondary | ICD-10-CM | POA: Diagnosis not present

## 2019-05-13 DIAGNOSIS — I10 Essential (primary) hypertension: Secondary | ICD-10-CM | POA: Diagnosis not present

## 2019-05-13 DIAGNOSIS — G4700 Insomnia, unspecified: Secondary | ICD-10-CM | POA: Diagnosis not present

## 2019-05-13 DIAGNOSIS — E7800 Pure hypercholesterolemia, unspecified: Secondary | ICD-10-CM | POA: Diagnosis not present

## 2019-05-13 DIAGNOSIS — Z1331 Encounter for screening for depression: Secondary | ICD-10-CM | POA: Diagnosis not present

## 2019-05-16 NOTE — Assessment & Plan Note (Signed)
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 Breast exam 05/20/2018: Left mastectomy, no palpable lumps or nodules in the right breast 2. Mammogram  and cortisone 2019 right breast No abnormalities. Breast Density Category C. 3. Breast MRI scheduled for 05/18/2019   Return to clinic in 1 year for annual follow-ups

## 2019-05-17 ENCOUNTER — Telehealth: Payer: Self-pay | Admitting: Hematology and Oncology

## 2019-05-17 NOTE — Telephone Encounter (Signed)
6/26 f/u converted to phone visit. Confirmed with patient. Per patient she does not have skill/capability for virtual visits and prefers phone call.

## 2019-05-18 ENCOUNTER — Other Ambulatory Visit: Payer: Self-pay

## 2019-05-18 ENCOUNTER — Ambulatory Visit (HOSPITAL_COMMUNITY)
Admission: RE | Admit: 2019-05-18 | Discharge: 2019-05-18 | Disposition: A | Discharge status: Home / Self Care | Payer: Medicare Other | Source: Ambulatory Visit | Attending: Hematology and Oncology | Admitting: Hematology and Oncology

## 2019-05-18 DIAGNOSIS — N6489 Other specified disorders of breast: Secondary | ICD-10-CM | POA: Diagnosis not present

## 2019-05-18 DIAGNOSIS — H401112 Primary open-angle glaucoma, right eye, moderate stage: Secondary | ICD-10-CM | POA: Diagnosis not present

## 2019-05-18 DIAGNOSIS — Z1231 Encounter for screening mammogram for malignant neoplasm of breast: Secondary | ICD-10-CM | POA: Insufficient documentation

## 2019-05-18 DIAGNOSIS — H401122 Primary open-angle glaucoma, left eye, moderate stage: Secondary | ICD-10-CM | POA: Diagnosis not present

## 2019-05-18 LAB — POCT I-STAT CREATININE: Creatinine, Ser: 0.8 mg/dL (ref 0.44–1.00)

## 2019-05-18 MED ORDER — GADOBUTROL 1 MMOL/ML IV SOLN
7.0000 mL | Freq: Once | INTRAVENOUS | Status: AC | PRN
  Administered 2019-05-18: 7 mL via INTRAVENOUS

## 2019-05-19 ENCOUNTER — Telehealth: Payer: Self-pay | Admitting: Hematology and Oncology

## 2019-05-19 NOTE — Progress Notes (Signed)
  HEMATOLOGY-ONCOLOGY TELEPHONE VISIT PROGRESS NOTE  I connected with [name] on 05/20/2019 at 10:00 AM EDT by telephone and verified that I am speaking with the correct person using two identifiers.  I discussed the limitations, risks, security and privacy concerns of performing an evaluation and management service by telephone and the availability of in person appointments.  I also discussed with the patient that there may be a patient responsible charge related to this service. The patient expressed understanding and agreed to proceed.   History of Present Illness: [name] is a 73 y.o. female with above-mentioned history of left breast cancer treated with mastectomy, adjuvant chemotherapy, and anti-estrogen therapy for 7 years. I last saw her a year ago. Breast MRI on 05/18/19 showed no evidence of malignancy bilaterally. She presents over the phone today for annual follow-up.   Oncology History  Cancer of lower-inner quadrant of left female breast ([place])  08/29/2005 Surgery   Left breast mastectomy: Multicentric invasive ductal carcinoma 2.5 and 1.5 cm grade 3 with high-grade DCIS 1/5 lymph node with Micrometastases ER 99%, PR 0%, Ki-67 45%, HER-2 2+ fish negative   10/14/2005 - 01/23/2006 Chemotherapy   Adjuvant chemotherapy with 6 cycles of FEC   02/23/2006 - 10/25/2013 Anti-estrogen oral therapy   Letrozole 2.5 mg daily changed to tamoxifen due to pain but she did not take this switched to raloxifene stopped 2014   09/04/2013 Surgery   Bilateral salpingo-oophorectomy (genetic testing revealed BRCA2 gene mutation positivity)     Observations/Objective:  No evidence of recurrence.   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 Breast exam 05/20/2018: Left mastectomy, no palpable lumps or nodules in the right breast 2. Mammogram 2019 right breast No abnormalities. Breast Density Category C. 3. Breast MRI scheduled for 05/18/2019: Benign   Glaucoma and cataract: may need surgery (July 2020).  Return to clinic in 1 year for annual follow-ups  I discussed the assessment and treatment plan with the patient. The patient was provided an opportunity to ask questions and all were answered. The patient agreed with the plan and demonstrated an understanding of the instructions. The patient was advised to call back or seek an in-person evaluation if the symptoms worsen or if the condition fails to improve as anticipated.   I provided 15 minutes of non-face-to-face time during this encounter.   Rulon Eisenmenger, MD 05/20/2019    I, Molly Dorshimer, am acting as scribe for Nicholas Lose, MD.  I have reviewed the above documentation for accuracy and completeness, and I agree with the above.

## 2019-05-19 NOTE — Telephone Encounter (Signed)
Contacted patient to verify telephone visit for pre reg °

## 2019-05-20 ENCOUNTER — Inpatient Hospital Stay: Payer: Medicare Other | Attending: Hematology and Oncology | Admitting: Hematology and Oncology

## 2019-05-20 DIAGNOSIS — Z90722 Acquired absence of ovaries, bilateral: Secondary | ICD-10-CM | POA: Diagnosis not present

## 2019-05-20 DIAGNOSIS — Z9012 Acquired absence of left breast and nipple: Secondary | ICD-10-CM | POA: Diagnosis not present

## 2019-05-20 DIAGNOSIS — Z9079 Acquired absence of other genital organ(s): Secondary | ICD-10-CM

## 2019-05-20 DIAGNOSIS — Z90710 Acquired absence of both cervix and uterus: Secondary | ICD-10-CM

## 2019-05-20 DIAGNOSIS — Z170 Estrogen receptor positive status [ER+]: Secondary | ICD-10-CM

## 2019-05-20 DIAGNOSIS — Z79811 Long term (current) use of aromatase inhibitors: Secondary | ICD-10-CM

## 2019-05-20 DIAGNOSIS — C50312 Malignant neoplasm of lower-inner quadrant of left female breast: Secondary | ICD-10-CM

## 2019-05-20 DIAGNOSIS — Z1231 Encounter for screening mammogram for malignant neoplasm of breast: Secondary | ICD-10-CM

## 2019-05-23 DIAGNOSIS — H401122 Primary open-angle glaucoma, left eye, moderate stage: Secondary | ICD-10-CM | POA: Diagnosis not present

## 2019-05-23 DIAGNOSIS — H401112 Primary open-angle glaucoma, right eye, moderate stage: Secondary | ICD-10-CM | POA: Diagnosis not present

## 2019-06-02 DIAGNOSIS — Z01812 Encounter for preprocedural laboratory examination: Secondary | ICD-10-CM | POA: Diagnosis not present

## 2019-06-02 DIAGNOSIS — H401122 Primary open-angle glaucoma, left eye, moderate stage: Secondary | ICD-10-CM | POA: Diagnosis not present

## 2019-06-02 DIAGNOSIS — H401124 Primary open-angle glaucoma, left eye, indeterminate stage: Secondary | ICD-10-CM | POA: Diagnosis not present

## 2019-06-02 DIAGNOSIS — Z1159 Encounter for screening for other viral diseases: Secondary | ICD-10-CM | POA: Diagnosis not present

## 2019-06-06 DIAGNOSIS — H401112 Primary open-angle glaucoma, right eye, moderate stage: Secondary | ICD-10-CM | POA: Diagnosis not present

## 2019-06-06 DIAGNOSIS — H401122 Primary open-angle glaucoma, left eye, moderate stage: Secondary | ICD-10-CM | POA: Diagnosis not present

## 2019-06-07 DIAGNOSIS — H401122 Primary open-angle glaucoma, left eye, moderate stage: Secondary | ICD-10-CM | POA: Diagnosis not present

## 2019-06-20 DIAGNOSIS — H401132 Primary open-angle glaucoma, bilateral, moderate stage: Secondary | ICD-10-CM | POA: Diagnosis not present

## 2019-06-20 DIAGNOSIS — Z79899 Other long term (current) drug therapy: Secondary | ICD-10-CM | POA: Diagnosis not present

## 2019-06-20 DIAGNOSIS — Z7952 Long term (current) use of systemic steroids: Secondary | ICD-10-CM | POA: Diagnosis not present

## 2019-06-20 DIAGNOSIS — Z9889 Other specified postprocedural states: Secondary | ICD-10-CM | POA: Diagnosis not present

## 2019-07-28 DIAGNOSIS — Z23 Encounter for immunization: Secondary | ICD-10-CM | POA: Diagnosis not present

## 2019-07-29 ENCOUNTER — Ambulatory Visit (INDEPENDENT_AMBULATORY_CARE_PROVIDER_SITE_OTHER): Payer: Medicare Other | Admitting: Gynecology

## 2019-07-29 ENCOUNTER — Encounter: Payer: Self-pay | Admitting: Gynecology

## 2019-07-29 ENCOUNTER — Other Ambulatory Visit: Payer: Self-pay

## 2019-07-29 VITALS — BP 120/70

## 2019-07-29 DIAGNOSIS — N898 Other specified noninflammatory disorders of vagina: Secondary | ICD-10-CM

## 2019-07-29 DIAGNOSIS — R300 Dysuria: Secondary | ICD-10-CM

## 2019-07-29 LAB — WET PREP FOR TRICH, YEAST, CLUE

## 2019-07-29 MED ORDER — FLUCONAZOLE 150 MG PO TABS: 150.0000 mg | ORAL_TABLET | Freq: Once | ORAL | 0 refills | Status: AC

## 2019-07-29 MED ORDER — NITROFURANTOIN MONOHYD MACRO 100 MG PO CAPS: 100.0000 mg | ORAL_CAPSULE | Freq: Two times a day (BID) | ORAL | 0 refills | Status: DC

## 2019-07-29 NOTE — Patient Instructions (Signed)
Take the antibiotic twice daily for 7 days to cover for a possible urinary tract infection. Take the Diflucan pill once to cover for yeast.  Follow-up if your symptoms persist, worsen or recur.

## 2019-07-29 NOTE — Progress Notes (Signed)
    [name] 10-26-1946 [id number]        73 y.o.  G2P0020 presents with 2-week history of suprapubic discomfort and some low back pain.  Also notes some vaginal itching.  No significant discharge.  No odor.  Past medical history,surgical history, problem list, medications, allergies, family history and social history were all reviewed and documented in the EPIC chart.  Directed ROS with pertinent positives and negatives documented in the history of present illness/assessment and plan.  Exam: [name] assistant Vitals:   07/29/19 1200  BP: 120/70   General appearance:  Normal Spine straight without CVA tenderness. Abdomen soft nontender without mass guarding rebound Pelvic external BUS vagina with atrophic changes.  Scant discharge noted.  Bimanual without masses or tenderness.  Assessment/Plan:  73 y.o. G2P0020 with history and exam as above.  Wet prep is negative.  Urine analysis appears contaminated but does show some bacteria.  Given we are entering a 3-day weekend going to cover her for UTI with Macrobid 100 mg twice daily x7 days.  Also with Diflucan 150 mg x 1 because of the itching to cover for yeast.  Will follow-up if her symptoms persist or recur.    [name] MD, 12:08 PM 07/29/2019

## 2019-07-31 LAB — URINALYSIS, COMPLETE W/RFL CULTURE
Bilirubin Urine: NEGATIVE
Glucose, UA: NEGATIVE
Hgb urine dipstick: NEGATIVE
Hyaline Cast: NONE SEEN /LPF
Ketones, ur: NEGATIVE
Leukocyte Esterase: NEGATIVE
Nitrites, Initial: POSITIVE — AB
Protein, ur: NEGATIVE
RBC / HPF: NONE SEEN /HPF (ref 0–2)
Specific Gravity, Urine: 1.025 (ref 1.001–1.03)
pH: 5.5 (ref 5.0–8.0)

## 2019-07-31 LAB — CULTURE INDICATED

## 2019-07-31 LAB — URINE CULTURE
MICRO NUMBER:: 853454
SPECIMEN QUALITY:: ADEQUATE

## 2019-08-02 ENCOUNTER — Telehealth: Payer: Self-pay

## 2019-08-02 NOTE — Telephone Encounter (Signed)
Patient said she only took two Macrobid because it made things worse. She said she was hurting so bad with the pain in her side and her "stomach" is swelling.  She said she is worse now than when she saw you. No vaginal sx.  She was asking for you to prescribed Metrogel but I explained with no vaginal sx that would not be appropriate.  She sees a urologist and used to get treatments for her bladder but they discontinued those. Then they had her taking baking soda and Azo but she said that made her BP go up.  I told her you may want her to go back and see urologist.  What to advise her?

## 2019-08-02 NOTE — Telephone Encounter (Signed)
Patient said she will call her urologist. She said she came here because it takes so long to get in with her urologist but she will call and schedule. Declined pelvic u/s for now.

## 2019-08-02 NOTE — Telephone Encounter (Signed)
Her urine did not grow out significant bacteria.  I also gave her a Diflucan.  Did she take that?  If urinary symptoms continue then she will need to see the urologist.  If pelvic discomfort is an issue I would recommend baseline ultrasound.

## 2019-08-08 DIAGNOSIS — R300 Dysuria: Secondary | ICD-10-CM | POA: Diagnosis not present

## 2019-08-08 DIAGNOSIS — N952 Postmenopausal atrophic vaginitis: Secondary | ICD-10-CM | POA: Diagnosis not present

## 2019-08-18 ENCOUNTER — Encounter: Payer: Medicare Other | Admitting: Gynecology

## 2019-09-01 ENCOUNTER — Encounter: Payer: Self-pay | Admitting: Gynecology

## 2019-09-02 DIAGNOSIS — Z853 Personal history of malignant neoplasm of breast: Secondary | ICD-10-CM | POA: Diagnosis not present

## 2019-09-02 DIAGNOSIS — Z1231 Encounter for screening mammogram for malignant neoplasm of breast: Secondary | ICD-10-CM | POA: Diagnosis not present

## 2019-09-05 ENCOUNTER — Other Ambulatory Visit: Payer: Self-pay

## 2019-09-06 ENCOUNTER — Encounter: Payer: Self-pay | Admitting: Gynecology

## 2019-09-06 ENCOUNTER — Ambulatory Visit (INDEPENDENT_AMBULATORY_CARE_PROVIDER_SITE_OTHER): Payer: Medicare Other | Admitting: Gynecology

## 2019-09-06 VITALS — BP 120/74 | Ht 64.0 in | Wt 146.0 lb

## 2019-09-06 DIAGNOSIS — Z9189 Other specified personal risk factors, not elsewhere classified: Secondary | ICD-10-CM

## 2019-09-06 DIAGNOSIS — M8580 Other specified disorders of bone density and structure, unspecified site: Secondary | ICD-10-CM

## 2019-09-06 DIAGNOSIS — N952 Postmenopausal atrophic vaginitis: Secondary | ICD-10-CM

## 2019-09-06 DIAGNOSIS — Z01419 Encounter for gynecological examination (general) (routine) without abnormal findings: Secondary | ICD-10-CM | POA: Diagnosis not present

## 2019-09-06 DIAGNOSIS — N9983 Residual ovary syndrome: Secondary | ICD-10-CM

## 2019-09-06 DIAGNOSIS — Z1501 Genetic susceptibility to malignant neoplasm of breast: Secondary | ICD-10-CM

## 2019-09-06 DIAGNOSIS — Z124 Encounter for screening for malignant neoplasm of cervix: Secondary | ICD-10-CM | POA: Diagnosis not present

## 2019-09-06 DIAGNOSIS — Z9289 Personal history of other medical treatment: Secondary | ICD-10-CM

## 2019-09-06 DIAGNOSIS — Z853 Personal history of malignant neoplasm of breast: Secondary | ICD-10-CM

## 2019-09-06 NOTE — Patient Instructions (Signed)
Follow-up for the ultrasound as scheduled. 

## 2019-09-06 NOTE — Progress Notes (Signed)
    [name] 1946-01-28 [id number]        73 y.o.  G2P0020 for annual gynecologic exam.  Without gynecologic complaints  Past medical history,surgical history, problem list, medications, allergies, family history and social history were all reviewed and documented as reviewed in the EPIC chart.  ROS:  Performed with pertinent positives and negatives included in the history, assessment and plan.   Additional significant findings : None   Exam: [name] assistant Vitals:   09/06/19 1054  BP: 120/74  Weight: 146 lb (66.2 kg)  Height: '5\' 4"'$  (1.626 m)   Body mass index is 25.06 kg/m.  General appearance:  Normal affect, orientation and appearance. Skin: Grossly normal HEENT: Without gross lesions.  No cervical or supraclavicular adenopathy. Thyroid normal.  Lungs:  Clear without wheezing, rales or rhonchi Cardiac: RR, without RMG Abdominal:  Soft, nontender, without masses, guarding, rebound, organomegaly or hernia Breasts:  Examined lying and sitting.  Right without masses, retractions, discharge or axillary adenopathy.  Left status post mastectomy without masses or axillary adenopathy Pelvic:  Ext, BUS, Vagina: With atrophic changes  Cervix: With atrophic changes.  Pap smear done  Uterus: Anteverted, normal size, shape and contour, midline and mobile nontender   Adnexa: Without masses or tenderness    Anus and perineum: Normal   Rectovaginal: Normal sphincter tone without palpated masses or tenderness.    Assessment/Plan:  73 y.o. G48P0020 female for breast and pelvic exam.  1. Postmenopausal.  No significant menopausal symptoms or any vaginal bleeding. 2. History of BRCA2 positive status post left mastectomy.  Continues with annual mammography and MRI surveillance.  Last mammogram this month.  MRI 04/2019.  Breast exam normal today.  History of LSO by Dr. Cherylann Banas where right ovary was not identified and felt that it was removed with an ectopic pregnancy surgery  earlier.  Follow-up ultrasound in 2016 with questionable ovarian remnant.  Follow-up ultrasound 2017 negative.  Ultrasound 2018 with small questionable remnant at 1.4 cc volume right adnexa.  Ultrasound 2019 negative.  We will go ahead and relook now with ultrasound and she will schedule in follow-up for this. 3. Osteopenia.  DEXA 08/2018 T score -1.2 FRAX 4.6% / 0.7%.  Plan follow-up DEXA in another year or 2. 4. Colonoscopy up-to-date.  Repeat at their recommended interval. 5. Pap smear 2017.  Pap smear done today.  No history of abnormal Pap smears.  Options to stop screening reviewed per current screening guidelines and she feels uncomfortable stop screening. 6. Health maintenance.  No routine lab work done as patient does this elsewhere.  Follow-up for ultrasound.  Follow-up in 1 year for annual exam.   [name] MD, 11:13 AM 09/06/2019

## 2019-09-06 NOTE — Addendum Note (Signed)
Addended by: Nelva Nay on: 09/06/2019 11:29 AM   Modules accepted: Orders

## 2019-09-07 LAB — PAP IG W/ RFLX HPV ASCU

## 2019-09-14 ENCOUNTER — Telehealth: Payer: Self-pay | Admitting: Hematology and Oncology

## 2019-09-14 NOTE — Telephone Encounter (Signed)
Scheduled appt per 10/20 sch message - mailed reminder letter with appt date and time

## 2019-09-15 DIAGNOSIS — H401112 Primary open-angle glaucoma, right eye, moderate stage: Secondary | ICD-10-CM | POA: Diagnosis not present

## 2019-09-15 DIAGNOSIS — H401122 Primary open-angle glaucoma, left eye, moderate stage: Secondary | ICD-10-CM | POA: Diagnosis not present

## 2019-09-19 ENCOUNTER — Other Ambulatory Visit: Payer: Medicare Other

## 2019-09-19 ENCOUNTER — Ambulatory Visit: Payer: Medicare Other | Admitting: Gynecology

## 2019-10-03 ENCOUNTER — Other Ambulatory Visit: Payer: Self-pay

## 2019-10-04 ENCOUNTER — Ambulatory Visit (INDEPENDENT_AMBULATORY_CARE_PROVIDER_SITE_OTHER): Payer: Medicare Other | Admitting: Gynecology

## 2019-10-04 ENCOUNTER — Ambulatory Visit (INDEPENDENT_AMBULATORY_CARE_PROVIDER_SITE_OTHER): Payer: Medicare Other

## 2019-10-04 ENCOUNTER — Encounter: Payer: Self-pay | Admitting: Gynecology

## 2019-10-04 VITALS — BP 118/72

## 2019-10-04 DIAGNOSIS — N9983 Residual ovary syndrome: Secondary | ICD-10-CM

## 2019-10-04 NOTE — Progress Notes (Signed)
    [name] [birth year] [id number]        73 y.o.  G2P0020 presents for ultrasound.  History of positive BRCA 2 status post left mastectomy.  Had laparoscopic LSO by Dr. Cherylann Banas where right ovary was not found.  Was thought to be removed at the time of an ectopic pregnancy surgery earlier.  Subsequently has had ultrasounds to include 2016 showing questionable small ovarian remnant on the right measuring 1.7 cm.  Follow-up ultrasound did not show this area in 2017.  Repeat ultrasound 2018 showed an area measuring 1.4 cc volume.  Ultrasound last year 2019 did not show an area in the right adnexa.  Past medical history,surgical history, problem list, medications, allergies, family history and social history were all reviewed and documented in the EPIC chart.  Directed ROS with pertinent positives and negatives documented in the history of present illness/assessment and plan.  Exam: Vitals:   10/04/19 1227  BP: 118/72   General appearance:  Normal  Ultrasound transvaginal shows uterus normal size and echotexture.  Myoma 0.74 cm noted.  Endometrial echo 2.3 mm with small amount of fluid within the cavity.  Left adnexa is normal.  Right adnexa shows 1.7 x 1 x 0.6 cm area of questionable ovarian remnant.  No free fluid or cul-de-sac abnormalities.  Assessment/Plan:  73 y.o. G2P0020 with persistent small right area questionable ovarian remnant versus scar tissue.  Has not changed since visualization 2016.  Options for management to include surgery to attempt to remove this area versus observation reviewed.  Risks of surgery to include not finding this area versus missed pathology and development of ovarian cancer in the future reviewed.  At this point the patient is comfortable with continued observation and we will plan on repeating her ultrasound next year and following this area.    [name] MD, 12:45 PM 10/04/2019

## 2019-10-04 NOTE — Patient Instructions (Signed)
Follow-up next year for your annual exam when due.  We will plan on repeating your ultrasound.

## 2019-10-05 DIAGNOSIS — H401132 Primary open-angle glaucoma, bilateral, moderate stage: Secondary | ICD-10-CM | POA: Diagnosis not present

## 2019-10-05 DIAGNOSIS — H401112 Primary open-angle glaucoma, right eye, moderate stage: Secondary | ICD-10-CM | POA: Diagnosis not present

## 2019-10-05 DIAGNOSIS — Z79899 Other long term (current) drug therapy: Secondary | ICD-10-CM | POA: Diagnosis not present

## 2019-10-05 DIAGNOSIS — H401122 Primary open-angle glaucoma, left eye, moderate stage: Secondary | ICD-10-CM | POA: Diagnosis not present

## 2019-10-14 DIAGNOSIS — H401132 Primary open-angle glaucoma, bilateral, moderate stage: Secondary | ICD-10-CM | POA: Diagnosis not present

## 2019-10-14 DIAGNOSIS — H401122 Primary open-angle glaucoma, left eye, moderate stage: Secondary | ICD-10-CM | POA: Diagnosis not present

## 2019-10-14 DIAGNOSIS — H401112 Primary open-angle glaucoma, right eye, moderate stage: Secondary | ICD-10-CM | POA: Diagnosis not present

## 2019-10-14 DIAGNOSIS — Z79899 Other long term (current) drug therapy: Secondary | ICD-10-CM | POA: Diagnosis not present

## 2019-10-25 DIAGNOSIS — U071 COVID-19: Secondary | ICD-10-CM

## 2019-10-25 HISTORY — DX: COVID-19: U07.1

## 2019-10-31 DIAGNOSIS — H401122 Primary open-angle glaucoma, left eye, moderate stage: Secondary | ICD-10-CM | POA: Diagnosis not present

## 2019-10-31 DIAGNOSIS — H401112 Primary open-angle glaucoma, right eye, moderate stage: Secondary | ICD-10-CM | POA: Diagnosis not present

## 2019-11-02 DIAGNOSIS — E7800 Pure hypercholesterolemia, unspecified: Secondary | ICD-10-CM | POA: Diagnosis not present

## 2019-11-02 DIAGNOSIS — M859 Disorder of bone density and structure, unspecified: Secondary | ICD-10-CM | POA: Diagnosis not present

## 2019-11-07 DIAGNOSIS — R82998 Other abnormal findings in urine: Secondary | ICD-10-CM | POA: Diagnosis not present

## 2019-11-15 DIAGNOSIS — Z1212 Encounter for screening for malignant neoplasm of rectum: Secondary | ICD-10-CM | POA: Diagnosis not present

## 2019-11-16 ENCOUNTER — Ambulatory Visit: Payer: Medicare Other | Attending: Internal Medicine

## 2019-11-16 DIAGNOSIS — Z20822 Contact with and (suspected) exposure to covid-19: Secondary | ICD-10-CM

## 2019-11-17 LAB — NOVEL CORONAVIRUS, NAA: SARS-CoV-2, NAA: DETECTED — AB

## 2019-11-20 ENCOUNTER — Telehealth: Payer: Self-pay | Admitting: Infectious Diseases

## 2019-11-20 NOTE — Telephone Encounter (Signed)
Called to discuss with patient about Covid symptoms and the use of bamlanivimab, a monoclonal antibody infusion for those with mild to moderate Covid symptoms and at a high risk of hospitalization.  Pt is qualified for this infusion at the [facility] infusion center due to Age > 27. Her symptom onset of runny nose started 4 days ago and noticed loss of sense of taste yesterday.   She feels better today - Has been taking coricidin and nasal spray with good effect. Has also been taking vitamin D and hopes it is helping.   She went to get tested because her daughter tested positive - she was taken to the ER to get IVF.    With her having early and mild symptoms she would qualify - she would like to discuss with her doctor and will get back to me Monday via [place].  Isolation precautions discussed.

## 2019-11-21 ENCOUNTER — Other Ambulatory Visit: Payer: Self-pay | Admitting: Infectious Diseases

## 2019-11-21 DIAGNOSIS — U071 COVID-19: Secondary | ICD-10-CM

## 2019-11-21 DIAGNOSIS — I10 Essential (primary) hypertension: Secondary | ICD-10-CM

## 2019-11-21 NOTE — Telephone Encounter (Signed)
Contacted back by patient - she and her husband have discussed with their PCP and would like to go ahead with the infusion.   Scheduled Wednesday 12/30 at 1PM

## 2019-11-21 NOTE — Progress Notes (Signed)
  I connected by phone with [name] on 11/21/2019 at 12:16 PM to discuss the potential use of an new treatment for mild to moderate COVID-19 viral infection in non-hospitalized patients.  This patient is a 73 y.o. female that meets the FDA criteria for Emergency Use Authorization of bamlanivimab or casirivimab\imdevimab.  Has a (+) direct SARS-CoV-2 viral test result  Has mild or moderate COVID-19   Is ? 73 years of age and weighs ? 40 kg  Is NOT hospitalized due to COVID-19  Is NOT requiring oxygen therapy or requiring an increase in baseline oxygen flow rate due to COVID-19  Is within 10 days of symptom onset  Has at least one of the high risk factor(s) for progression to severe COVID-19 and/or hospitalization as defined in EUA.  Specific high risk criteria : >/= 75 yo   I have spoken and communicated the following to the patient or parent/caregiver:  1. FDA has authorized the emergency use of bamlanivimab and casirivimab\imdevimab for the treatment of mild to moderate COVID-19 in adults and pediatric patients with positive results of direct SARS-CoV-2 viral testing who are 24 years of age and older weighing at least 40 kg, and who are at high risk for progressing to severe COVID-19 and/or hospitalization.  2. The significant known and potential risks and benefits of bamlanivimab and casirivimab\imdevimab, and the extent to which such potential risks and benefits are unknown.  3. Information on available alternative treatments and the risks and benefits of those alternatives, including clinical trials.  4. Patients treated with bamlanivimab and casirivimab\imdevimab should continue to self-isolate and use infection control measures (e.g., wear mask, isolate, social distance, avoid sharing personal items, clean and disinfect "high touch" surfaces, and frequent handwashing) according to CDC guidelines.   5. The patient or parent/caregiver has the option to accept or refuse  bamlanivimab or casirivimab\imdevimab .  After reviewing this information with the patient, The patient agreed to proceed with receiving the bamlanimivab infusion and will be provided a copy of the Fact sheet prior to receiving the infusion.[name] 11/21/2019 12:16 PM

## 2019-11-23 ENCOUNTER — Ambulatory Visit (HOSPITAL_COMMUNITY)
Admission: RE | Admit: 2019-11-23 | Discharge: 2019-11-23 | Disposition: A | Discharge status: Home / Self Care | Payer: Medicare Other | Source: Ambulatory Visit | Attending: Pulmonary Disease | Admitting: Pulmonary Disease

## 2019-11-23 ENCOUNTER — Encounter (HOSPITAL_COMMUNITY): Payer: Self-pay

## 2019-11-23 DIAGNOSIS — Z23 Encounter for immunization: Secondary | ICD-10-CM | POA: Diagnosis not present

## 2019-11-23 DIAGNOSIS — I10 Essential (primary) hypertension: Secondary | ICD-10-CM | POA: Diagnosis not present

## 2019-11-23 DIAGNOSIS — U071 COVID-19: Secondary | ICD-10-CM | POA: Diagnosis not present

## 2019-11-23 MED ORDER — METHYLPREDNISOLONE SODIUM SUCC 125 MG IJ SOLR: 125.0000 mg | Freq: Once | INTRAMUSCULAR | Status: DC | PRN

## 2019-11-23 MED ORDER — SODIUM CHLORIDE 0.9 % IV SOLN
INTRAVENOUS | Status: DC | PRN
  Administered 2019-11-23: 250 mL via INTRAVENOUS

## 2019-11-23 MED ORDER — ALBUTEROL SULFATE HFA 108 (90 BASE) MCG/ACT IN AERS: 2.0000 | INHALATION_SPRAY | Freq: Once | RESPIRATORY_TRACT | Status: DC | PRN

## 2019-11-23 MED ORDER — EPINEPHRINE 0.3 MG/0.3ML IJ SOAJ: 0.3000 mg | Freq: Once | INTRAMUSCULAR | Status: DC | PRN

## 2019-11-23 MED ORDER — DIPHENHYDRAMINE HCL 50 MG/ML IJ SOLN: 50.0000 mg | Freq: Once | INTRAMUSCULAR | Status: DC | PRN

## 2019-11-23 MED ORDER — SODIUM CHLORIDE 0.9 % IV SOLN
700.0000 mg | Freq: Once | INTRAVENOUS | Status: AC
  Administered 2019-11-23: 14:00:00 700 mg via INTRAVENOUS
  Filled 2019-11-23: qty 20

## 2019-11-23 MED ORDER — FAMOTIDINE IN NACL 20-0.9 MG/50ML-% IV SOLN: 20.0000 mg | Freq: Once | INTRAVENOUS | Status: DC | PRN

## 2019-11-23 NOTE — Discharge Instructions (Signed)
10 Things You Can Do to Manage Your COVID-19 Symptoms at Home If you have possible or confirmed COVID-19: 1. Stay home from work and school. And stay away from other public places. If you must go out, avoid using any kind of public transportation, ridesharing, or taxis. 2. Monitor your symptoms carefully. If your symptoms get worse, call your healthcare provider immediately. 3. Get rest and stay hydrated. 4. If you have a medical appointment, call the healthcare provider ahead of time and tell them that you have or may have COVID-19. 5. For medical emergencies, call 911 and notify the dispatch personnel that you have or may have COVID-19. 6. Cover your cough and sneezes with a tissue or use the inside of your elbow. 7. Wash your hands often with soap and water for at least 20 seconds or clean your hands with an alcohol-based hand sanitizer that contains at least 60% alcohol. 8. As much as possible, stay in a specific room and away from other people in your home. Also, you should use a separate bathroom, if available. If you need to be around other people in or outside of the home, wear a mask. 9. Avoid sharing personal items with other people in your household, like dishes, towels, and bedding. 10. Clean all surfaces that are touched often, like counters, tabletops, and doorknobs. Use household cleaning sprays or wipes according to the label instructions. [email or web address] 05/25/2019 This information is not intended to replace advice given to you by your health care provider. Make sure you discuss any questions you have with your health care provider. Document Revised: 10/27/2019 Document Reviewed: 10/27/2019 Elsevier Patient Education  [place].   COVID-19 Frequently Asked Questions COVID-19 (coronavirus disease) is an infection that is caused by a large family of viruses. Some viruses cause illness in people and others cause illness in animals like camels, cats, and bats. In some  cases, the viruses that cause illness in animals can spread to humans. Where did the coronavirus come from? In December 2019, [place] told the [facility]) of several cases of lung disease (human respiratory illness). These cases were linked to an open seafood and livestock market in the city of [place]. The link to the seafood and livestock market suggests that the virus may have spread from animals to humans. However, since that first outbreak in December, the virus has also been shown to spread from person to person. What is the name of the disease and the virus? Disease name Early on, this disease was called novel coronavirus. This is because scientists determined that the disease was caused by a new (novel) respiratory virus. The World Health Organization [facility]) has now named the disease COVID-19, or coronavirus disease. Virus name The virus that causes the disease is called severe acute respiratory syndrome coronavirus 2 (SARS-CoV-2). More information on disease and virus naming World Health Organization [facility]): www.who.int/emergencies/diseases/novel-coronavirus-2019/technical-guidance/naming-the-coronavirus-disease-(covid-2019)-and-the-virus-that-causes-it Who is at risk for complications from coronavirus disease? Some people may be at higher risk for complications from coronavirus disease. This includes older adults and people who have chronic diseases, such as heart disease, diabetes, and lung disease. If you are at higher risk for complications, take these extra precautions:  Avoid close contact with people who are sick or have a fever or cough. Stay at least 3-6 ft (1-2 m) away from them, if possible.  Wash your hands often with soap and water for at least 20 seconds.  Avoid touching your face, mouth, nose, or eyes.  Keep supplies on  hand at home, such as food, medicine, and cleaning supplies.  Stay home as much as possible.  Avoid social gatherings and travel. How  does coronavirus disease spread? The virus that causes coronavirus disease spreads easily from person to person (is contagious). There are also cases of community-spread disease. This means the disease has spread to:  People who have no known contact with other infected people.  People who have not traveled to areas where there are known cases. It appears to spread from one person to another through droplets from coughing or sneezing. Can I get the virus from touching surfaces or objects? There is still a lot that we do not know about the virus that causes coronavirus disease. Scientists are basing a lot of information on what they know about similar viruses, such as:  Viruses cannot generally survive on surfaces for long. They need a human body (host) to survive.  It is more likely that the virus is spread by close contact with people who are sick (direct contact), such as through: ? Shaking hands or hugging. ? Breathing in respiratory droplets that travel through the air. This can happen when an infected person coughs or sneezes on or near other people.  It is less likely that the virus is spread when a person touches a surface or object that has the virus on it (indirect contact). The virus may be able to enter the body if the person touches a surface or object and then touches his or her face, eyes, nose, or mouth. Can a person spread the virus without having symptoms of the disease? It may be possible for the virus to spread before a person has symptoms of the disease, but this is most likely not the main way the virus is spreading. It is more likely for the virus to spread by being in close contact with people who are sick and breathing in the respiratory droplets of a sick person's cough or sneeze. What are the symptoms of coronavirus disease? Symptoms vary from person to person and can range from mild to severe. Symptoms may include:  Fever.  Cough.  Tiredness, weakness, or  fatigue.  Fast breathing or feeling short of breath. These symptoms can appear anywhere from 2 to 14 days after you have been exposed to the virus. If you develop symptoms, call your health care provider. People with severe symptoms may need hospital care. If I am exposed to the virus, how long does it take before symptoms start? Symptoms of coronavirus disease may appear anywhere from 2 to 14 days after a person has been exposed to the virus. If you develop symptoms, call your health care provider. Should I be tested for this virus? Your health care provider will decide whether to test you based on your symptoms, history of exposure, and your risk factors. How does a health care provider test for this virus? Health care providers will collect samples to send for testing. Samples may include:  Taking a swab of fluid from the nose.  Taking fluid from the lungs by having you cough up mucus (sputum) into a sterile cup.  Taking a blood sample.  Taking a stool or urine sample. Is there a treatment or vaccine for this virus? Currently, there is no vaccine to prevent coronavirus disease. Also, there are no medicines like antibiotics or antivirals to treat the virus. A person who becomes sick is given supportive care, which means rest and fluids. A person may also relieve his or her  symptoms by using over-the-counter medicines that treat sneezing, coughing, and runny nose. These are the same medicines that a person takes for the common cold. If you develop symptoms, call your health care provider. People with severe symptoms may need hospital care. What can I do to protect myself and my family from this virus?     You can protect yourself and your family by taking the same actions that you would take to prevent the spread of other viruses. Take the following actions:  Wash your hands often with soap and water for at least 20 seconds. If soap and water are not available, use alcohol-based hand  sanitizer.  Avoid touching your face, mouth, nose, or eyes.  Cough or sneeze into a tissue, sleeve, or elbow. Do not cough or sneeze into your hand or the air. ? If you cough or sneeze into a tissue, throw it away immediately and wash your hands.  Disinfect objects and surfaces that you frequently touch every day.  Avoid close contact with people who are sick or have a fever or cough. Stay at least 3-6 ft (1-2 m) away from them, if possible.  Stay home if you are sick, except to get medical care. Call your health care provider before you get medical care.  Make sure your vaccines are up to date. Ask your health care provider what vaccines you need. What should I do if I need to travel? Follow travel recommendations from your local health authority, the CDC, and WHO. Travel information and advice  Centers for Disease Control and Prevention (CDC): BodyEditor.hu  World Health Organization [facility]): ThirdIncome.ca Know the risks and take action to protect your health  You are at higher risk of getting coronavirus disease if you are traveling to areas with an outbreak or if you are exposed to travelers from areas with an outbreak.  Wash your hands often and practice good hygiene to lower the risk of catching or spreading the virus. What should I do if I am sick? General instructions to stop the spread of infection  Wash your hands often with soap and water for at least 20 seconds. If soap and water are not available, use alcohol-based hand sanitizer.  Cough or sneeze into a tissue, sleeve, or elbow. Do not cough or sneeze into your hand or the air.  If you cough or sneeze into a tissue, throw it away immediately and wash your hands.  Stay home unless you must get medical care. Call your health care provider or local health authority before you get medical care.  Avoid public areas. Do not take  public transportation, if possible.  If you can, wear a mask if you must go out of the house or if you are in close contact with someone who is not sick. Keep your home clean  Disinfect objects and surfaces that are frequently touched every day. This may include: ? Counters and tables. ? Doorknobs and light switches. ? Sinks and faucets. ? Electronics such as phones, remote controls, keyboards, computers, and tablets.  Wash dishes in hot, soapy water or use a dishwasher. Air-dry your dishes.  Wash laundry in hot water. Prevent infecting other household members  Let healthy household members care for children and pets, if possible. If you have to care for children or pets, wash your hands often and wear a mask.  Sleep in a different bedroom or bed, if possible.  Do not share personal items, such as razors, toothbrushes, deodorant, combs, brushes, towels, and  washcloths. Where to find more information Centers for Disease Control and Prevention (CDC)  Information and news updates: https://www.butler-gonzalez.com/ World Health Organization [facility])  Information and news updates: MissExecutive.com.ee  Coronavirus health topic: https://www.castaneda.info/  Questions and answers on COVID-19: OpportunityDebt.at  Global tracker: who.sprinklr.com American Academy of Pediatrics (AAP)  Information for families: www.healthychildren.org/English/health-issues/conditions/chest-lungs/Pages/2019-Novel-Coronavirus.aspx The coronavirus situation is changing rapidly. Check your local health authority website or the CDC and [facility] websites for updates and news. When should I contact a health care provider?  Contact your health care provider if you have symptoms of an infection, such as fever or cough, and you: ? Have been near anyone who is known to have coronavirus disease. ? Have come into contact with a person who is  suspected to have coronavirus disease. ? Have traveled outside of the country. When should I get emergency medical care?  Get help right away by calling your local emergency services (911 in the [place] if you have: ? Trouble breathing. ? Pain or pressure in your chest. ? Confusion. ? Blue-tinged lips and fingernails. ? Difficulty waking from sleep. ? Symptoms that get worse. Let the emergency medical personnel know if you think you have coronavirus disease. Summary  A new respiratory virus is spreading from person to person and causing COVID-19 (coronavirus disease).  The virus that causes COVID-19 appears to spread easily. It spreads from one person to another through droplets from coughing or sneezing.  Older adults and those with chronic diseases are at higher risk of disease. If you are at higher risk for complications, take extra precautions.  There is currently no vaccine to prevent coronavirus disease. There are no medicines, such as antibiotics or antivirals, to treat the virus.  You can protect yourself and your family by washing your hands often, avoiding touching your face, and covering your coughs and sneezes. This information is not intended to replace advice given to you by your health care provider. Make sure you discuss any questions you have with your health care provider. Document Released: 03/08/2019 Document Revised: 03/08/2019 Document Reviewed: 03/08/2019 Elsevier Patient Education  [place].

## 2019-11-23 NOTE — Progress Notes (Signed)
  Diagnosis: COVID-19  Physician: Dr. Osborne Casco  Procedure: [facility] Infusion Clinic Med: bamlanivimab infusion - Provided patient with bamlanimivab fact sheet for patients, parents and caregivers prior to infusion.  Complications: No immediate complications noted.  Discharge: Discharged home   Lashun Mccants N Darely Becknell 11/23/2019

## 2019-11-23 NOTE — Progress Notes (Signed)
  Diagnosis: COVID-19  Physician: Domenick Gong, MD  Procedure: Covid Infusion Clinic Med: bamlanivimab infusion - Provided patient with bamlanimivab fact sheet for patients, parents and caregivers prior to infusion.  Complications: No immediate complications noted.  Discharge: Discharged home   [place] 11/23/2019

## 2020-01-02 DIAGNOSIS — M542 Cervicalgia: Secondary | ICD-10-CM | POA: Diagnosis not present

## 2020-01-02 DIAGNOSIS — E7800 Pure hypercholesterolemia, unspecified: Secondary | ICD-10-CM | POA: Diagnosis not present

## 2020-01-02 DIAGNOSIS — I10 Essential (primary) hypertension: Secondary | ICD-10-CM | POA: Diagnosis not present

## 2020-01-02 DIAGNOSIS — Z8616 Personal history of COVID-19: Secondary | ICD-10-CM | POA: Diagnosis not present

## 2020-01-02 DIAGNOSIS — G43C0 Periodic headache syndromes in child or adult, not intractable: Secondary | ICD-10-CM | POA: Diagnosis not present

## 2020-01-09 DIAGNOSIS — H401122 Primary open-angle glaucoma, left eye, moderate stage: Secondary | ICD-10-CM | POA: Diagnosis not present

## 2020-01-09 DIAGNOSIS — H401112 Primary open-angle glaucoma, right eye, moderate stage: Secondary | ICD-10-CM | POA: Diagnosis not present

## 2020-01-11 ENCOUNTER — Ambulatory Visit (HOSPITAL_COMMUNITY)
Admission: RE | Admit: 2020-01-11 | Discharge: 2020-01-11 | Disposition: A | Discharge status: Home / Self Care | Payer: Medicare Other | Source: Ambulatory Visit | Attending: Vascular Surgery | Admitting: Vascular Surgery

## 2020-01-11 ENCOUNTER — Other Ambulatory Visit (HOSPITAL_COMMUNITY): Payer: Self-pay | Admitting: Internal Medicine

## 2020-01-11 ENCOUNTER — Other Ambulatory Visit: Payer: Self-pay

## 2020-01-11 DIAGNOSIS — M542 Cervicalgia: Secondary | ICD-10-CM

## 2020-01-14 ENCOUNTER — Ambulatory Visit: Payer: Medicare Other | Attending: Internal Medicine

## 2020-01-14 DIAGNOSIS — Z23 Encounter for immunization: Secondary | ICD-10-CM | POA: Insufficient documentation

## 2020-01-14 NOTE — Progress Notes (Signed)
   Covid-19 Vaccination Clinic  Name:  MODENA STEEDLEY    MRN: [id number] DOB: Nov 09, 1946  01/14/2020  Ms. Clayton was observed post Covid-19 immunization for 15 minutes without incidence. She was provided with Vaccine Information Sheet and instruction to access the V-Safe system.   Ms. Gritz was instructed to call 911 with any severe reactions post vaccine: Marland Kitchen Difficulty breathing  . Swelling of your face and throat  . A fast heartbeat  . A bad rash all over your body  . Dizziness and weakness    Immunizations Administered    Name Date Dose VIS Date Route   Pfizer COVID-19 Vaccine 01/14/2020  8:19 AM 0.3 mL 11/04/2019 Intramuscular   Manufacturer: [place]   Lot: [id number]   [place]: [id number]

## 2020-01-14 NOTE — Progress Notes (Signed)
.  covido

## 2020-01-16 DIAGNOSIS — K5900 Constipation, unspecified: Secondary | ICD-10-CM | POA: Diagnosis not present

## 2020-01-16 DIAGNOSIS — I10 Essential (primary) hypertension: Secondary | ICD-10-CM | POA: Diagnosis not present

## 2020-01-16 DIAGNOSIS — G43C0 Periodic headache syndromes in child or adult, not intractable: Secondary | ICD-10-CM | POA: Diagnosis not present

## 2020-01-16 DIAGNOSIS — I6523 Occlusion and stenosis of bilateral carotid arteries: Secondary | ICD-10-CM | POA: Diagnosis not present

## 2020-01-30 DIAGNOSIS — H401112 Primary open-angle glaucoma, right eye, moderate stage: Secondary | ICD-10-CM | POA: Diagnosis not present

## 2020-01-30 DIAGNOSIS — H401122 Primary open-angle glaucoma, left eye, moderate stage: Secondary | ICD-10-CM | POA: Diagnosis not present

## 2020-02-06 ENCOUNTER — Ambulatory Visit: Payer: Medicare Other | Attending: Internal Medicine

## 2020-02-06 DIAGNOSIS — Z23 Encounter for immunization: Secondary | ICD-10-CM

## 2020-02-06 NOTE — Progress Notes (Signed)
   Covid-19 Vaccination Clinic  Name:  [name]    MRN: [id number] DOB: [birth year]  02/06/2020  [name] was observed post Covid-19 immunization for 15 minutes without incident. She was provided with Vaccine Information Sheet and instruction to access the V-Safe system.   [name] was instructed to call 911 with any severe reactions post vaccine: Marland Kitchen Difficulty breathing  . Swelling of face and throat  . A fast heartbeat  . A bad rash all over body  . Dizziness and weakness   Immunizations Administered    Name Date Dose VIS Date Route   Pfizer COVID-19 Vaccine 02/06/2020  8:18 AM 0.3 mL 11/04/2019 Intramuscular   Manufacturer: [place]   Lot: [id number]   [place]: [id number]

## 2020-02-15 DIAGNOSIS — Z20822 Contact with and (suspected) exposure to covid-19: Secondary | ICD-10-CM | POA: Diagnosis not present

## 2020-02-15 DIAGNOSIS — Z20828 Contact with and (suspected) exposure to other viral communicable diseases: Secondary | ICD-10-CM | POA: Diagnosis not present

## 2020-02-21 ENCOUNTER — Encounter: Payer: 59 | Admitting: Vascular Surgery

## 2020-02-21 ENCOUNTER — Encounter (HOSPITAL_COMMUNITY): Payer: 59

## 2020-02-21 DIAGNOSIS — H401122 Primary open-angle glaucoma, left eye, moderate stage: Secondary | ICD-10-CM | POA: Diagnosis not present

## 2020-03-14 ENCOUNTER — Other Ambulatory Visit: Payer: Self-pay | Admitting: *Deleted

## 2020-03-14 DIAGNOSIS — I6529 Occlusion and stenosis of unspecified carotid artery: Secondary | ICD-10-CM

## 2020-03-20 ENCOUNTER — Encounter: Payer: Self-pay | Admitting: Vascular Surgery

## 2020-03-20 ENCOUNTER — Other Ambulatory Visit: Payer: Self-pay

## 2020-03-20 ENCOUNTER — Ambulatory Visit (HOSPITAL_COMMUNITY)
Admission: RE | Admit: 2020-03-20 | Discharge: 2020-03-20 | Disposition: A | Discharge status: Home / Self Care | Payer: Medicare Other | Source: Ambulatory Visit | Attending: Surgery | Admitting: Surgery

## 2020-03-20 ENCOUNTER — Ambulatory Visit (INDEPENDENT_AMBULATORY_CARE_PROVIDER_SITE_OTHER): Payer: Medicare Other | Admitting: Vascular Surgery

## 2020-03-20 DIAGNOSIS — I6523 Occlusion and stenosis of bilateral carotid arteries: Secondary | ICD-10-CM

## 2020-03-20 DIAGNOSIS — I6529 Occlusion and stenosis of unspecified carotid artery: Secondary | ICD-10-CM

## 2020-03-20 NOTE — Progress Notes (Signed)
Patient name: [name] MRN: [id number] DOB: 1946/05/19 Sex: female  REASON FOR CONSULT: Evaluate bilateral carotid artery stenosis  HPI: [name] is a 74 y.o. female, with history of hypertension, hyperlipidemia, history of breast cancer that presents for evaluation of bilateral carotid artery stenosis.  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  She states ultimately after she discussed this with her PCP this prompted a ultrasound of her carotid arteries.  There was concern for 60 to 79% stenosis in the right ICA with 40 to 59% stenosis in the left ICA after duplex.  She denies any previous history of strokes or TIAs.  States she does take an aspirin as well as a statin daily.  Ultimately she states all of the neck pain as well as her headaches on the left side got relieved after she had eye surgery with her ophthalmologist and the pressure was relieved from her glaucoma.  No other neurologic events or symptoms.  No history of neck surgery or neck radiation.  Past Medical History:  Diagnosis Date  . 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 COVID-19 10/2019  . Glaucoma   . History of cystitis   . 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 Hyperlipidemia   . Hypertension   . Osteopenia 08/2018   T score -1.2 FRAX 4.6% / 0.7%    Past Surgical History:  Procedure Laterality Date  . BILATERAL SALPINGOOPHORECTOMY  9.6.12   Left side removed. Right tube and ovary never identified  . BREAST SURGERY  08-29-2005   TOTAL LEFT MASTECTOMY W/ NODE DISSECTION X4  . CARPAL TUNNEL RELEASE  2007   RIGHT  . CATARACT EXTRACTION     Right eye  . DILATION AND CURETTAGE OF UTERUS  1968  . dx laparoscopy  9.6.12   resulted into open salpingo-opphorectomy   . ECTOPIC  PREGNANCY SURGERY  73 & 74  . EYE SURGERY  2001   RIGHT EYE FOR GLAUCOMA  . HAND SURGERY    . HYSTEROSCOPY WITH D & C  11/05/2012   Procedure: DILATATION AND CURETTAGE /HYSTEROSCOPY;  Surgeon: Bennetta Laos, MD;  Location: [facility];  Service: Gynecology;  Laterality: N/A;  . PORTA CATH    . TRANSTHORACIC ECHOCARDIOGRAM  10-14-2005   LVSF NORMAL/ EF 55-65%/ MILD MITRAL REGURG    Family History  Problem Relation Age of Onset  . Hypertension Mother   . Cancer Father        COLON  . Breast cancer Sister        Age 42's  . Diabetes Sister   . Stroke Sister   . Breast cancer Sister        Age 29's  . Stroke Sister   . Breast cancer Sister        Age 18's  . Breast cancer Paternal Grandmother        Age unknown  . Cancer Paternal Grandfather        Unknown type    SOCIAL HISTORY: Social History   Socioeconomic History  . Marital status: Married    Spouse name: Not on file  . Number of children: Not on file  . Years of education: Not on file  .  Highest education level: Not on file  Occupational History  . Not on file  Tobacco Use  . Smoking status: Former Research scientist (life sciences)  . Smokeless tobacco: Never Used  . Tobacco comment: smoking for a few yrs >5  , quit many yrs ago  Substance and Sexual Activity  . Alcohol use: No    Alcohol/week: 0.0 standard drinks  . Drug use: No  . Sexual activity: Yes    Partners: Male    Birth control/protection: Post-menopausal    Comment: 1st intercourse 69 yo-1 partner  Other Topics Concern  . Not on file  Social History Narrative  . Not on file   Social Determinants of Health   Financial Resource Strain:   . Difficulty of Paying Living Expenses:   Food Insecurity:   . Worried About Charity fundraiser in the Last Year:   . Arboriculturist in the Last Year:   Transportation Needs:   . Film/video editor (Medical):   Marland Kitchen Lack of Transportation (Non-Medical):   Physical Activity:   . Days of Exercise per Week:    . Minutes of Exercise per Session:   Stress:   . Feeling of Stress :   Social Connections:   . Frequency of Communication with Friends and Family:   . Frequency of Social Gatherings with Friends and Family:   . Attends Religious Services:   . Active Member of Clubs or Organizations:   . Attends Archivist Meetings:   Marland Kitchen Marital Status:   Intimate Partner Violence:   . Fear of Current or Ex-Partner:   . Emotionally Abused:   Marland Kitchen Physically Abused:   . Sexually Abused:     No Known Allergies  Current Outpatient Medications  Medication Sig Dispense Refill  . acetaminophen (TYLENOL 8 HOUR) 650 MG CR tablet Take 1 tablet (650 mg total) by mouth every 8 (eight) hours as needed. 30 tablet 0  . atorvastatin (LIPITOR) 40 MG tablet     . Brimonidine Tartrate (ALPHAGAN P OP) Apply to eye 2 (two) times daily.     . Calcium Carbonate-Vit D-Min (CALTRATE 600+D PLUS PO) Take 1 tablet by mouth 2 (two) times daily.     . Cholecalciferol (VITAMIN D) 1000 UNITS capsule Take 1,000 Units by mouth daily.     . dorzolamide-timolol (COSOPT) 22.3-6.8 MG/ML ophthalmic solution Place 1 drop into both eyes 2 (two) times daily.     Marland Kitchen losartan-hydrochlorothiazide (HYZAAR) 100-25 MG tablet Take 1 tablet by mouth every morning.      No current facility-administered medications for this visit.    REVIEW OF SYSTEMS:  '[X]'$  denotes positive finding, '[ ]'$  denotes negative finding Cardiac  Comments:  Chest pain or chest pressure:    Shortness of breath upon exertion:    Short of breath when lying flat:    Irregular heart rhythm:        Vascular    Pain in calf, thigh, or hip brought on by ambulation:    Pain in feet at night that wakes you up from your sleep:     Blood clot in your veins:    Leg swelling:         Pulmonary    Oxygen at home:    Productive cough:     Wheezing:         Neurologic    Sudden weakness in arms or legs:     Sudden numbness in arms or legs:     Sudden onset of  difficulty speaking or slurred speech:    Temporary loss of vision in one eye:     Problems with dizziness:         Gastrointestinal    Blood in stool:     Vomited blood:         Genitourinary    Burning when urinating:     Blood in urine:        Psychiatric    Major depression:         Hematologic    Bleeding problems:    Problems with blood clotting too easily:        Skin    Rashes or ulcers:        Constitutional    Fever or chills:      PHYSICAL EXAM: Vitals:   03/20/20 1207  BP: 140/82  Pulse: 75  Resp: 16  Temp: (!) 97.5 F (36.4 C)  TempSrc: Oral  SpO2: 98%  Weight: 142 lb 8 oz (64.6 kg)  Height: '5\' 5"'$  (1.651 m)    GENERAL: The patient is a well-nourished female, in no acute distress. The vital signs are documented above. CARDIAC: There is a regular rate and rhythm.  VASCULAR:  Palpable radial pulses bilaterally Palpable femoral pulses bilaterally Palpable dorsalis pedis pulses bilaterally PULMONARY: There is good air exchange bilaterally without wheezing or rales. ABDOMEN: Soft and non-tender with normal pitched bowel sounds.  MUSCULOSKELETAL: There are no major deformities or cyanosis. NEUROLOGIC: No focal weakness or paresthesias are detected.  CN II-XII grossly intact SKIN: There are no ulcers or rashes noted. PSYCHIATRIC: The patient has a normal affect.  DATA:   Carotid duplex done on 01/11/2020 shows a 60 to 79% right ICA stenosis in the mid ICA and a 40 to 59% stenosis in the left ICA.   Repeat duplex here today shows 40-59% ICA stenosis bilaterally.    Assessment/Plan:  74 year old female presents for evaluation of bilateral carotid artery stenosis.  Discussed that this is asymptomatic disease given no history of TIA or strokes.  I think her left-sided headache/neck pain back in February was related to glaucoma issues with her left eye that has since resolved since eye surgery.  Discussed in the setting of asymptomatic disease would not  offer any surgical intervention until more than 80% stenosis.  On repeat duplex today we only demonstrated a 40 to 59% stenosis in bilateral ICA's whereas the referring ultrasound suggested a higher 60 to 79% stenosis on the right.  Either way recommend medical management for now with aspirin and statin and I will have her follow-up in 6 months with repeat carotid duplex for surveillance.   Marty Heck, MD [facility] Office: [phone]

## 2020-03-21 ENCOUNTER — Other Ambulatory Visit: Payer: Self-pay | Admitting: *Deleted

## 2020-03-21 DIAGNOSIS — I6523 Occlusion and stenosis of bilateral carotid arteries: Secondary | ICD-10-CM

## 2020-04-11 DIAGNOSIS — Z7952 Long term (current) use of systemic steroids: Secondary | ICD-10-CM | POA: Diagnosis not present

## 2020-04-11 DIAGNOSIS — H401132 Primary open-angle glaucoma, bilateral, moderate stage: Secondary | ICD-10-CM | POA: Diagnosis not present

## 2020-04-24 ENCOUNTER — Other Ambulatory Visit: Payer: Self-pay

## 2020-04-24 ENCOUNTER — Encounter: Payer: Self-pay | Admitting: Nurse Practitioner

## 2020-04-24 ENCOUNTER — Ambulatory Visit (INDEPENDENT_AMBULATORY_CARE_PROVIDER_SITE_OTHER): Payer: Medicare Other | Admitting: Nurse Practitioner

## 2020-04-24 VITALS — BP 118/80

## 2020-04-24 DIAGNOSIS — N898 Other specified noninflammatory disorders of vagina: Secondary | ICD-10-CM | POA: Diagnosis not present

## 2020-04-24 DIAGNOSIS — N760 Acute vaginitis: Secondary | ICD-10-CM | POA: Diagnosis not present

## 2020-04-24 DIAGNOSIS — R300 Dysuria: Secondary | ICD-10-CM | POA: Diagnosis not present

## 2020-04-24 LAB — URINALYSIS, COMPLETE W/RFL CULTURE
Bacteria, UA: NONE SEEN /HPF
Bilirubin Urine: NEGATIVE
Glucose, UA: NEGATIVE
Hgb urine dipstick: NEGATIVE
Hyaline Cast: NONE SEEN /LPF
Ketones, ur: NEGATIVE
Leukocyte Esterase: NEGATIVE
Nitrites, Initial: NEGATIVE
Protein, ur: NEGATIVE
RBC / HPF: NONE SEEN /HPF (ref 0–2)
Specific Gravity, Urine: 1.015 (ref 1.001–1.03)
WBC, UA: NONE SEEN /HPF (ref 0–5)
pH: 8.5 — ABNORMAL HIGH (ref 5.0–8.0)

## 2020-04-24 LAB — NO CULTURE INDICATED

## 2020-04-24 LAB — WET PREP FOR TRICH, YEAST, CLUE

## 2020-04-24 MED ORDER — HYDROCORTISONE 1 % EX OINT: 1.0000 "application " | TOPICAL_OINTMENT | Freq: Two times a day (BID) | CUTANEOUS | 1 refills | Status: AC

## 2020-04-24 NOTE — Progress Notes (Signed)
   Acute Office Visit  Subjective:    Patient ID: [name], female    DOB: 1946/04/23, 74 y.o.   MRN: [id number]  Chief Complaint  Patient presents with  . Vaginal Itching    jk  . Dysuria  . Back Pain    HPI Presents today for vaginal itching, dysuria, and back pain that began 2 weeks ago. Dysuria occurs at beginning of stream, denies hematuria, frequency, or urgency. Back pain is described as dull, mid, bilateral. History of interstitial cystitis, followed by urology. Sexually active with husband.    Review of Systems  Constitutional: Negative.   Gastrointestinal: Negative for abdominal pain, constipation and diarrhea.       Bloating  Genitourinary: Positive for dysuria and vaginal discharge. Negative for flank pain, frequency, hematuria and urgency.  Musculoskeletal: Positive for back pain.       Objective:    Physical Exam Constitutional:      Appearance: Normal appearance.  Abdominal:     General: Abdomen is flat.     Palpations: Abdomen is soft.     Tenderness: There is no abdominal tenderness. There is no right CVA tenderness, left CVA tenderness, guarding or rebound.  Genitourinary:    General: Normal vulva.     Vagina: Normal.     Cervix: Normal.     Uterus: Normal.      Comments: Atrophic changes    BP 118/80 (BP Location: Right Arm, Patient Position: Sitting, Cuff Size: Normal)  Wt Readings from Last 3 Encounters:  03/20/20 142 lb 8 oz (64.6 kg)  09/06/19 146 lb (66.2 kg)  08/16/18 146 lb (66.2 kg)   UA: Negative leukocytes, no WBCs, no RBCs, no bacteria Wet prep negative     Assessment & Plan:   Problem List Items Addressed This Visit    None    Visit Diagnoses    Acute vaginitis    -  Primary   Relevant Medications   hydrocortisone 1 % ointment   Dysuria       Relevant Orders   Urinalysis,Complete w/RFL Culture   Vagina itching       Relevant Orders   WET PREP FOR TRICH, YEAST, CLUE      Plan: UA and wet prep unremarkable.   Saw urology recently with similar symptoms, negative work-up.  Most likely vulvovaginitis due to postmenopausal dryness.  Apply hydrocortisone 1% ointment twice a day for 14 days.  If symptoms worsen or do not improve return to office.    Tamela Gammon [facility], 2:18 PM 04/24/2020

## 2020-05-07 ENCOUNTER — Telehealth: Payer: Self-pay | Admitting: Adult Health

## 2020-05-07 NOTE — Telephone Encounter (Signed)
Rescheduled 06/21 appointment per 06/09 scheduled message, called patient and left a voicemail.

## 2020-05-14 ENCOUNTER — Ambulatory Visit: Payer: 59 | Admitting: Hematology and Oncology

## 2020-05-23 ENCOUNTER — Other Ambulatory Visit: Payer: Self-pay

## 2020-05-23 ENCOUNTER — Inpatient Hospital Stay: Payer: Medicare Other | Attending: Adult Health | Admitting: Adult Health

## 2020-05-23 ENCOUNTER — Encounter: Payer: Self-pay | Admitting: Adult Health

## 2020-05-23 VITALS — BP 150/76 | HR 76 | Temp 98.5°F | Resp 18 | Ht 65.0 in | Wt 142.9 lb

## 2020-05-23 DIAGNOSIS — Z803 Family history of malignant neoplasm of breast: Secondary | ICD-10-CM | POA: Diagnosis not present

## 2020-05-23 DIAGNOSIS — I6523 Occlusion and stenosis of bilateral carotid arteries: Secondary | ICD-10-CM

## 2020-05-23 DIAGNOSIS — I6529 Occlusion and stenosis of unspecified carotid artery: Secondary | ICD-10-CM | POA: Insufficient documentation

## 2020-05-23 DIAGNOSIS — Z170 Estrogen receptor positive status [ER+]: Secondary | ICD-10-CM | POA: Diagnosis not present

## 2020-05-23 DIAGNOSIS — Z90722 Acquired absence of ovaries, bilateral: Secondary | ICD-10-CM | POA: Diagnosis not present

## 2020-05-23 DIAGNOSIS — M85851 Other specified disorders of bone density and structure, right thigh: Secondary | ICD-10-CM | POA: Insufficient documentation

## 2020-05-23 DIAGNOSIS — Z79811 Long term (current) use of aromatase inhibitors: Secondary | ICD-10-CM | POA: Insufficient documentation

## 2020-05-23 DIAGNOSIS — Z87891 Personal history of nicotine dependence: Secondary | ICD-10-CM | POA: Diagnosis not present

## 2020-05-23 DIAGNOSIS — Z823 Family history of stroke: Secondary | ICD-10-CM | POA: Insufficient documentation

## 2020-05-23 DIAGNOSIS — Z800 Family history of malignant neoplasm of digestive organs: Secondary | ICD-10-CM | POA: Diagnosis not present

## 2020-05-23 DIAGNOSIS — C50312 Malignant neoplasm of lower-inner quadrant of left female breast: Secondary | ICD-10-CM | POA: Insufficient documentation

## 2020-05-23 DIAGNOSIS — Z1501 Genetic susceptibility to malignant neoplasm of breast: Secondary | ICD-10-CM

## 2020-05-23 DIAGNOSIS — Z9012 Acquired absence of left breast and nipple: Secondary | ICD-10-CM | POA: Insufficient documentation

## 2020-05-23 DIAGNOSIS — Z833 Family history of diabetes mellitus: Secondary | ICD-10-CM | POA: Insufficient documentation

## 2020-05-23 DIAGNOSIS — Z8249 Family history of ischemic heart disease and other diseases of the circulatory system: Secondary | ICD-10-CM | POA: Insufficient documentation

## 2020-05-23 DIAGNOSIS — Z1509 Genetic susceptibility to other malignant neoplasm: Secondary | ICD-10-CM

## 2020-05-23 NOTE — Progress Notes (Signed)
[facility] Follow up:    Tisovec, [name], MD [place] [address]   DIAGNOSIS: Cancer Staging No matching staging information was found for the patient.  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 female returns for evaluation of her high risk of breast cancer due to BRCA 2 positivity.  She does note that she has a couple of relatives who have a history of pancreatic cancer as well.    She notes that she has been in good health this year.  Her most recent bone density was completed 08/2018 and showed osteopenia with T score of -1.2 in the right femur.  She is taking Calcium and vitamin D.  She does not exercise.    She is up to date with her PCP visits, and sees Dr. Osborne Casco regularly.  She is due for f/u with him in 10/2020.  She is up to date with skin cancer, gyn , and colon cancer screenings. She is not intentionally exercising. She feels like she has a skin change in her right upper inner breast and wants me to evaluate this.  Patient Active Problem List   Diagnosis Date Noted  . Carotid artery stenosis 03/20/2020  . 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 HYPERLIPIDEMIA 12/05/2010  . HYPERTENSION 12/05/2010  . ALLERGIC RHINITIS  12/05/2010  . CYSTITIS 12/05/2010  . SNORING 12/05/2010  . Cancer of lower-inner quadrant of left female breast ([place]) 09/04/2005    has No Known Allergies.  MEDICAL HISTORY: Past Medical History:  Diagnosis Date  . 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 COVID-19 10/2019  . Glaucoma   . History of cystitis   . 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 Hyperlipidemia   . Hypertension   . Osteopenia 08/2018   T score -1.2 FRAX 4.6% / 0.7%    SURGICAL HISTORY: Past Surgical History:  Procedure Laterality Date  . BILATERAL SALPINGOOPHORECTOMY  9.6.12   Left side removed. Right tube and ovary never identified  . BREAST SURGERY  08-29-2005   TOTAL LEFT MASTECTOMY W/ NODE DISSECTION X4  . CARPAL TUNNEL RELEASE  2007   RIGHT  . CATARACT EXTRACTION     Right eye  . DILATION AND CURETTAGE OF UTERUS  1968  . dx laparoscopy  9.6.12   resulted into open salpingo-opphorectomy   . ECTOPIC PREGNANCY SURGERY  73 & 74  . EYE SURGERY  2001   RIGHT EYE FOR GLAUCOMA  . HAND SURGERY    . HYSTEROSCOPY WITH D & C  11/05/2012   Procedure: DILATATION AND CURETTAGE /HYSTEROSCOPY;  Surgeon: Bennetta Laos, MD;  Location:  [facility];  Service: Gynecology;  Laterality: N/A;  . PORTA CATH    . TRANSTHORACIC ECHOCARDIOGRAM  10-14-2005   LVSF NORMAL/ EF 55-65%/ MILD MITRAL REGURG    SOCIAL HISTORY: Social History   Socioeconomic History  . Marital status: Married    Spouse name: Not on file  . Number of children: Not on file  . Years of education: Not on file  . Highest education level: Not on file  Occupational History  . Not on file  Tobacco Use  . Smoking status: Former Research scientist (life sciences)  . Smokeless tobacco: Never Used  . Tobacco comment: smoking for a few yrs >5  , quit many yrs ago  Vaping Use  . Vaping Use: Never used  Substance and Sexual  Activity  . Alcohol use: No    Alcohol/week: 0.0 standard drinks  . Drug use: No  . Sexual activity: Yes    Partners: Male    Birth control/protection: Post-menopausal    Comment: 1st intercourse 78 yo-1 partner  Other Topics Concern  . Not on file  Social History Narrative  . Not on file   Social Determinants of Health   Financial Resource Strain:   . Difficulty of Paying Living Expenses:   Food Insecurity:   . Worried About Charity fundraiser in the Last Year:   . Arboriculturist in the Last Year:   Transportation Needs:   . Film/video editor (Medical):   Marland Kitchen Lack of Transportation (Non-Medical):   Physical Activity:   . Days of Exercise per Week:   . Minutes of Exercise per Session:   Stress:   . Feeling of Stress :   Social Connections:   . Frequency of Communication with Friends and Family:   . Frequency of Social Gatherings with Friends and Family:   . Attends Religious Services:   . Active Member of Clubs or Organizations:   . Attends Archivist Meetings:   Marland Kitchen Marital Status:   Intimate Partner Violence:   . Fear of Current or Ex-Partner:   . Emotionally Abused:   Marland Kitchen Physically Abused:   . Sexually Abused:     FAMILY HISTORY: Family History  Problem Relation Age of Onset  . Hypertension Mother   . Cancer Father        COLON  . Breast cancer Sister        Age 59's  . Diabetes Sister   . Stroke Sister   . Breast cancer Sister        Age 1's  . Stroke Sister   . Breast cancer Sister        Age 54's  . Breast cancer Paternal Grandmother        Age unknown  . Cancer Paternal Grandfather        Unknown type    Review of Systems  Constitutional: Negative for appetite change, chills, fatigue, fever and unexpected weight change.  HENT:   Negative for hearing loss, lump/mass and mouth sores.   Eyes: Negative for eye problems and icterus.  Respiratory: Negative for chest tightness, cough and shortness of breath.   Cardiovascular: Negative  for chest pain, leg swelling and palpitations.  Gastrointestinal: Negative for abdominal distention, abdominal pain, constipation, diarrhea, nausea and vomiting.  Endocrine: Negative for hot flashes.  Musculoskeletal: Negative for arthralgias.  Skin: Negative for rash.  Neurological: Negative for dizziness, extremity weakness, headaches and numbness.  Hematological: Negative for adenopathy. Does not bruise/bleed easily.  Psychiatric/Behavioral: Negative  for depression. The patient is not nervous/anxious.       PHYSICAL EXAMINATION  ECOG PERFORMANCE STATUS: 1 - Symptomatic but completely ambulatory  Vitals:   05/23/20 1145  BP: (!) 150/76  Pulse: 76  Resp: 18  Temp: 98.5 F (36.9 C)  SpO2: 100%    Physical Exam Constitutional:      General: She is not in acute distress.    Appearance: Normal appearance. She is not toxic-appearing.  HENT:     Head: Normocephalic and atraumatic.  Cardiovascular:     Rate and Rhythm: Normal rate and regular rhythm.     Pulses: Normal pulses.     Heart sounds: Normal heart sounds.  Pulmonary:     Effort: Pulmonary effort is normal.     Breath sounds: Normal breath sounds.     Comments: Left breast s/p mastectomy, no sign of local recurrence, right breast with thickening noted in right upper inner breast, otherwise benign Abdominal:     General: Abdomen is flat. Bowel sounds are normal. There is no distension.     Palpations: Abdomen is soft.     Tenderness: There is no abdominal tenderness.  Musculoskeletal:        General: No swelling.     Cervical back: Neck supple.  Skin:    General: Skin is warm and dry.     Findings: No rash.  Neurological:     General: No focal deficit present.     Mental Status: She is alert.  Psychiatric:        Mood and Affect: Mood normal.        Behavior: Behavior normal.     LABORATORY DATA:  CBC    Component Value Date/Time   WBC 6.4 10/26/2015 1103   WBC 7.4 11/03/2012 1407   RBC 4.13  10/26/2015 1103   RBC 4.14 11/03/2012 1407   HGB 12.2 10/26/2015 1103   HCT 37.6 10/26/2015 1103   PLT 207 10/26/2015 1103   MCV 91.2 10/26/2015 1103   MCH 29.6 10/26/2015 1103   MCH 29.5 11/03/2012 1407   MCHC 32.5 10/26/2015 1103   MCHC 33.6 11/03/2012 1407   RDW 15.0 (H) 10/26/2015 1103   LYMPHSABS 2.4 10/26/2015 1103   MONOABS 0.5 10/26/2015 1103   EOSABS 0.1 10/26/2015 1103   BASOSABS 0.0 10/26/2015 1103    CMP     Component Value Date/Time   NA 143 10/26/2015 1103   K 3.7 10/26/2015 1103   CL 110 (H) 04/08/2013 1112   CO2 25 10/26/2015 1103   GLUCOSE 94 10/26/2015 1103   GLUCOSE 68 (L) 04/08/2013 1112   BUN 11.8 10/26/2015 1103   CREATININE 0.80 05/18/2019 0839   CREATININE 0.8 10/26/2015 1103   CALCIUM 9.4 10/26/2015 1103   PROT 7.5 10/26/2015 1103   ALBUMIN 4.0 10/26/2015 1103   AST 20 10/26/2015 1103   ALT 16 10/26/2015 1103   ALKPHOS 47 10/26/2015 1103   BILITOT 0.81 10/26/2015 1103   GFRNONAA 84 (L) 11/03/2012 1407   GFRAA >90 11/03/2012 1407        ASSESSMENT and PLAN:   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 Breast exam 05/23/2020 ? Thickening in right upper inner quadrant  2. Mammogram in  08/2019 was normal 3. Breast MRI ordered for 05/2020  Has FH of pancreatic cancer: ordered MRCP as well, per NCCN guidelines for BRCA2 positive patients with first or second degree relative with pancreatic cancer, to consider pancreatic cancer screening with MRCP as they are at increased risk for pancreatic cancer.    I recommended healthy diet and exercise as risk reduction as well.           All questions were answered. The patient knows to  call the clinic with any problems, questions or concerns. We can certainly see the patient much sooner if necessary.  Total encounter time: 20 minutes*  Wilber Bihari, NP 05/23/20 7:25 PM Medical Oncology and Hematology [facility] [address], [place] [address] Tel. [phone]    Fax. [phone]  *Total Encounter Time as defined by the Centers for Medicare and Medicaid Services includes, in addition to the face-to-face time of a patient visit (documented in the note above) non-face-to-face time: obtaining and reviewing outside history, ordering and reviewing medications, tests or procedures, care coordination (communications with other health care professionals or caregivers) and documentation in the medical record.

## 2020-05-23 NOTE — Assessment & Plan Note (Addendum)
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 Breast exam 05/23/2020 ? Thickening in right upper inner quadrant  2. Mammogram in 08/2019 was normal 3. Breast MRI ordered for 05/2020  Has FH of pancreatic cancer: ordered MRCP as well, per NCCN guidelines for BRCA2 positive patients with first or second degree relative with pancreatic cancer, to consider pancreatic cancer screening with MRCP as they are at increased risk for pancreatic cancer.    I recommended healthy diet and exercise as risk reduction as well.

## 2020-05-24 ENCOUNTER — Telehealth: Payer: Self-pay | Admitting: Hematology and Oncology

## 2020-05-24 NOTE — Telephone Encounter (Signed)
Scheduled appts per 6/30 los. Left voicemail with appt date and time.  

## 2020-06-10 ENCOUNTER — Ambulatory Visit
Admission: RE | Admit: 2020-06-10 | Discharge: 2020-06-10 | Disposition: A | Discharge status: Home / Self Care | Payer: Medicare Other | Source: Ambulatory Visit | Attending: Adult Health | Admitting: Adult Health

## 2020-06-10 DIAGNOSIS — Z1501 Genetic susceptibility to malignant neoplasm of breast: Secondary | ICD-10-CM

## 2020-06-10 DIAGNOSIS — Z170 Estrogen receptor positive status [ER+]: Secondary | ICD-10-CM

## 2020-06-10 DIAGNOSIS — Z1509 Genetic susceptibility to other malignant neoplasm: Secondary | ICD-10-CM

## 2020-06-10 DIAGNOSIS — C50312 Malignant neoplasm of lower-inner quadrant of left female breast: Secondary | ICD-10-CM

## 2020-06-10 DIAGNOSIS — Z800 Family history of malignant neoplasm of digestive organs: Secondary | ICD-10-CM

## 2020-06-10 MED ORDER — GADOBUTROL 1 MMOL/ML IV SOLN: 6.0000 mL | Freq: Once | INTRAVENOUS | Status: DC | PRN

## 2020-06-25 DIAGNOSIS — H401112 Primary open-angle glaucoma, right eye, moderate stage: Secondary | ICD-10-CM | POA: Diagnosis not present

## 2020-06-25 DIAGNOSIS — H401122 Primary open-angle glaucoma, left eye, moderate stage: Secondary | ICD-10-CM | POA: Diagnosis not present

## 2020-07-05 ENCOUNTER — Ambulatory Visit
Admission: RE | Admit: 2020-07-05 | Discharge: 2020-07-05 | Disposition: A | Discharge status: Home / Self Care | Payer: Medicare Other | Source: Ambulatory Visit | Attending: Adult Health | Admitting: Adult Health

## 2020-07-05 ENCOUNTER — Other Ambulatory Visit: Payer: Self-pay

## 2020-07-05 ENCOUNTER — Telehealth: Payer: Self-pay | Admitting: Hematology and Oncology

## 2020-07-05 DIAGNOSIS — Z853 Personal history of malignant neoplasm of breast: Secondary | ICD-10-CM | POA: Diagnosis not present

## 2020-07-05 DIAGNOSIS — Z9012 Acquired absence of left breast and nipple: Secondary | ICD-10-CM | POA: Diagnosis not present

## 2020-07-05 DIAGNOSIS — Z803 Family history of malignant neoplasm of breast: Secondary | ICD-10-CM | POA: Diagnosis not present

## 2020-07-05 MED ORDER — GADOBUTROL 1 MMOL/ML IV SOLN
6.0000 mL | Freq: Once | INTRAVENOUS | Status: AC | PRN
  Administered 2020-07-05: 6 mL via INTRAVENOUS

## 2020-07-05 NOTE — Telephone Encounter (Signed)
Inform her that the breast MRI was normal

## 2020-07-06 ENCOUNTER — Other Ambulatory Visit: Payer: Self-pay

## 2020-07-06 ENCOUNTER — Other Ambulatory Visit: Payer: Self-pay | Admitting: *Deleted

## 2020-07-06 ENCOUNTER — Telehealth: Payer: Self-pay

## 2020-07-06 DIAGNOSIS — C50312 Malignant neoplasm of lower-inner quadrant of left female breast: Secondary | ICD-10-CM

## 2020-07-06 NOTE — Telephone Encounter (Signed)
Pt given below information per Wilber Bihari, NP. Pt verbalized understanding. Order entered for MM screening.

## 2020-07-06 NOTE — Telephone Encounter (Signed)
-----   Message from Gardenia Phlegm, NP sent at 07/06/2020  9:35 AM EDT ----- Results are normal.  Please let patient know.  She is due for screening mammogram in 08/2020.  Will you make sure that is entered?  Thanks ----- Message ----- From: Interface, Rad Results In Sent: 07/05/2020   9:46 AM EDT To: Gardenia Phlegm, NP

## 2020-07-09 ENCOUNTER — Telehealth: Payer: Self-pay

## 2020-07-09 ENCOUNTER — Other Ambulatory Visit: Payer: Self-pay

## 2020-07-09 ENCOUNTER — Ambulatory Visit
Admission: RE | Admit: 2020-07-09 | Discharge: 2020-07-09 | Disposition: A | Discharge status: Home / Self Care | Payer: Medicare Other | Source: Ambulatory Visit | Attending: Adult Health | Admitting: Adult Health

## 2020-07-09 DIAGNOSIS — K8020 Calculus of gallbladder without cholecystitis without obstruction: Secondary | ICD-10-CM | POA: Diagnosis not present

## 2020-07-09 MED ORDER — GADOBENATE DIMEGLUMINE 529 MG/ML IV SOLN
11.0000 mL | Freq: Once | INTRAVENOUS | Status: AC | PRN
  Administered 2020-07-09: 11 mL via INTRAVENOUS

## 2020-07-09 NOTE — Telephone Encounter (Signed)
TC to pt per Wilber Bihari NP to let her know that her MRI shows no evidence of pancreatic cancer which is great news! It incidentally found gall stones which are at no clinical significance, unless she develops pain in her right upper abdomen, or nausea after meals. If she does note those things I let her know to call us, that could mean that she needs to have her gall bladder out. Also informed pt that most of the time, patients don't experience that though. Patient verbalized understanding and stated that she would be sure to call if she is having any pain in her abdomen. No further problems or concerns at this time.

## 2020-07-16 ENCOUNTER — Encounter: Payer: Self-pay | Admitting: *Deleted

## 2020-07-16 ENCOUNTER — Telehealth: Payer: Self-pay | Admitting: *Deleted

## 2020-07-16 NOTE — Progress Notes (Signed)
Per pt request, RN successfully faxed abdominal MRI to Dr. Osborne Casco at [phone].

## 2020-07-16 NOTE — Telephone Encounter (Signed)
Received VM from pt.  Attempt x1 to return call.  No answer, LVM to return call to the office.  

## 2020-07-20 DIAGNOSIS — H401132 Primary open-angle glaucoma, bilateral, moderate stage: Secondary | ICD-10-CM | POA: Diagnosis not present

## 2020-07-20 DIAGNOSIS — H25012 Cortical age-related cataract, left eye: Secondary | ICD-10-CM | POA: Diagnosis not present

## 2020-07-20 DIAGNOSIS — H2512 Age-related nuclear cataract, left eye: Secondary | ICD-10-CM | POA: Diagnosis not present

## 2020-07-20 DIAGNOSIS — Z961 Presence of intraocular lens: Secondary | ICD-10-CM | POA: Diagnosis not present

## 2020-07-25 HISTORY — PX: CATARACT EXTRACTION: SUR2

## 2020-07-26 ENCOUNTER — Ambulatory Visit (INDEPENDENT_AMBULATORY_CARE_PROVIDER_SITE_OTHER): Payer: Medicare Other | Admitting: Obstetrics and Gynecology

## 2020-07-26 ENCOUNTER — Encounter: Payer: Self-pay | Admitting: Obstetrics and Gynecology

## 2020-07-26 ENCOUNTER — Other Ambulatory Visit: Payer: Self-pay

## 2020-07-26 VITALS — BP 130/80

## 2020-07-26 DIAGNOSIS — R350 Frequency of micturition: Secondary | ICD-10-CM | POA: Diagnosis not present

## 2020-07-26 DIAGNOSIS — M545 Low back pain, unspecified: Secondary | ICD-10-CM

## 2020-07-26 DIAGNOSIS — N898 Other specified noninflammatory disorders of vagina: Secondary | ICD-10-CM

## 2020-07-26 DIAGNOSIS — N760 Acute vaginitis: Secondary | ICD-10-CM

## 2020-07-26 DIAGNOSIS — B9689 Other specified bacterial agents as the cause of diseases classified elsewhere: Secondary | ICD-10-CM

## 2020-07-26 LAB — WET PREP FOR TRICH, YEAST, CLUE

## 2020-07-26 MED ORDER — METRONIDAZOLE 500 MG PO TABS: 500.0000 mg | ORAL_TABLET | Freq: Two times a day (BID) | ORAL | 0 refills | Status: DC

## 2020-07-26 NOTE — Progress Notes (Signed)
   [name] 1946/02/14 [id number]  SUBJECTIVE:  74 y.o. G2P0020 female presents merrily for evaluation of low back pain in general discomfort.  She says she has no known back problems.  She says she typically gets pain in the low back when she has onset of vaginal infections, which she indicates she has frequently.  Last seen in this office June 2021 with back pain symptoms in addition to vaginal itching and dysuria with negative vaginal wet mount findings.  She denies any vaginal bleeding, not noting any abnormal discharge. Not using any hormones.    Current Outpatient Medications  Medication Sig Dispense Refill  . acetaminophen (TYLENOL 8 HOUR) 650 MG CR tablet Take 1 tablet (650 mg total) by mouth every 8 (eight) hours as needed. 30 tablet 0  . atorvastatin (LIPITOR) 80 MG tablet Take 80 mg by mouth daily.    . Brimonidine Tartrate (ALPHAGAN P OP) Apply to eye 2 (two) times daily.     . Cholecalciferol (VITAMIN D) 1000 UNITS capsule Take 1,000 Units by mouth daily.     . dorzolamide-timolol (COSOPT) 22.3-6.8 MG/ML ophthalmic solution Place 1 drop into both eyes 2 (two) times daily.     Marland Kitchen losartan-hydrochlorothiazide (HYZAAR) 100-25 MG tablet Take 1 tablet by mouth every morning.     Marland Kitchen aspirin 81 MG chewable tablet Chew 81 mg by mouth daily. (Patient not taking: Reported on 07/26/2020)    . Calcium Carbonate-Vit D-Min (CALTRATE 600+D PLUS PO) Take 1 tablet by mouth 2 (two) times daily.  (Patient not taking: Reported on 07/26/2020)     No current facility-administered medications for this visit.   Allergies: Patient has no known allergies.  No LMP recorded. Patient is postmenopausal.  Past medical history,surgical history, problem list, medications, allergies, family history and social history were all reviewed and documented as reviewed in the EPIC chart.  ROS:  + Low back pain.  No abdominal pain, change in bowel habits, black or bloody stools.  No urinary tract symptoms. GYN ROS: As  described in HPI   OBJECTIVE:  BP 130/80  The patient appears well, alert, oriented, in no distress. PELVIC EXAM: VULVA: normal appearing vulva with no masses, tenderness or lesions, VAGINA: normal appearing vagina with normal color and discharge, no lesions, CERVIX: normal appearing cervix without discharge or lesions, UTERUS: uterus is normal size, shape, consistency and nontender, ADNEXA: normal adnexa in size, nontender and no masses, WET MOUNT done - results: +clue cells Urinalysis unremarkable  Chaperone: Aurora Mask (DNP student) present during the examination and performed the pelvic exam with me in attendance to confirm the exam findings   ASSESSMENT:  74 y.o. G2P0020 here for back pain with bacterial vaginosis  PLAN:  Reviewed the diagnosis of bacterial vaginosis and recommend treatment with metronidazole 500 mg twice daily x7 days.  Discussed possible side effects and alcohol avoidance while taking this medication.  If still having symptoms she should follow-up with [place] and may need to check with her primary doctor regarding further work-up of her back pain if that persists.  Joseph Pierini MD 07/26/20

## 2020-07-27 ENCOUNTER — Telehealth: Payer: Self-pay | Admitting: *Deleted

## 2020-07-27 MED ORDER — METRONIDAZOLE 0.75 % VA GEL: 1.0000 | Freq: Every day | VAGINAL | 0 refills | Status: DC

## 2020-07-27 NOTE — Telephone Encounter (Signed)
Yes metrogel 0.75% x 5 nights

## 2020-07-27 NOTE — Telephone Encounter (Signed)
Patient informed, Rx sent.  

## 2020-07-27 NOTE — Telephone Encounter (Signed)
Patient was seen yesterday and prescribed Flagyl 500  Mg tablet, called this am c/o diarrhea all night, asked if pills could be switched to cream form? Please advise

## 2020-07-28 LAB — URINE CULTURE
MICRO NUMBER:: 10904931
SPECIMEN QUALITY:: ADEQUATE

## 2020-07-28 LAB — CULTURE INDICATED

## 2020-07-28 LAB — URINALYSIS, COMPLETE W/RFL CULTURE
Bilirubin Urine: NEGATIVE
Glucose, UA: NEGATIVE
Hgb urine dipstick: NEGATIVE
Hyaline Cast: NONE SEEN /LPF
Leukocyte Esterase: NEGATIVE
Nitrites, Initial: NEGATIVE
Protein, ur: NEGATIVE
RBC / HPF: NONE SEEN /HPF (ref 0–2)
Specific Gravity, Urine: 1.023 (ref 1.001–1.03)
pH: 5 (ref 5.0–8.0)

## 2020-07-31 ENCOUNTER — Encounter: Payer: Self-pay | Admitting: Obstetrics and Gynecology

## 2020-08-15 DIAGNOSIS — H25812 Combined forms of age-related cataract, left eye: Secondary | ICD-10-CM | POA: Diagnosis not present

## 2020-08-15 DIAGNOSIS — H2512 Age-related nuclear cataract, left eye: Secondary | ICD-10-CM | POA: Diagnosis not present

## 2020-08-15 DIAGNOSIS — H25012 Cortical age-related cataract, left eye: Secondary | ICD-10-CM | POA: Diagnosis not present

## 2020-08-28 ENCOUNTER — Ambulatory Visit: Payer: Medicare Other | Attending: Internal Medicine

## 2020-08-28 DIAGNOSIS — Z23 Encounter for immunization: Secondary | ICD-10-CM

## 2020-08-28 NOTE — Progress Notes (Signed)
° °  Covid-19 Vaccination Clinic  Name:  [name]    MRN: [id number] DOB: [birth year]  08/28/2020  [name] was observed post Covid-19 immunization for 15 minutes without incident. She was provided with Vaccine Information Sheet and instruction to access the V-Safe system.   [name] was instructed to call 911 with any severe reactions post vaccine:  Difficulty breathing   Swelling of face and throat   A fast heartbeat   A bad rash all over body   Dizziness and weakness

## 2020-09-07 DIAGNOSIS — Z1231 Encounter for screening mammogram for malignant neoplasm of breast: Secondary | ICD-10-CM | POA: Diagnosis not present

## 2020-09-08 DIAGNOSIS — Z23 Encounter for immunization: Secondary | ICD-10-CM | POA: Diagnosis not present

## 2020-09-11 ENCOUNTER — Other Ambulatory Visit: Payer: Self-pay

## 2020-09-11 ENCOUNTER — Encounter: Payer: Self-pay | Admitting: Obstetrics and Gynecology

## 2020-09-11 ENCOUNTER — Ambulatory Visit (INDEPENDENT_AMBULATORY_CARE_PROVIDER_SITE_OTHER): Payer: Medicare Other | Admitting: Obstetrics and Gynecology

## 2020-09-11 VITALS — BP 120/78 | Ht 64.0 in | Wt 137.0 lb

## 2020-09-11 DIAGNOSIS — Z1501 Genetic susceptibility to malignant neoplasm of breast: Secondary | ICD-10-CM | POA: Diagnosis not present

## 2020-09-11 DIAGNOSIS — Z1509 Genetic susceptibility to other malignant neoplasm: Secondary | ICD-10-CM | POA: Diagnosis not present

## 2020-09-11 DIAGNOSIS — Z01419 Encounter for gynecological examination (general) (routine) without abnormal findings: Secondary | ICD-10-CM

## 2020-09-11 DIAGNOSIS — Z9189 Other specified personal risk factors, not elsewhere classified: Secondary | ICD-10-CM | POA: Diagnosis not present

## 2020-09-11 DIAGNOSIS — N9983 Residual ovary syndrome: Secondary | ICD-10-CM

## 2020-09-11 DIAGNOSIS — Z853 Personal history of malignant neoplasm of breast: Secondary | ICD-10-CM

## 2020-09-11 DIAGNOSIS — M8580 Other specified disorders of bone density and structure, unspecified site: Secondary | ICD-10-CM

## 2020-09-11 NOTE — Progress Notes (Signed)
[name] 1946/10/13 [id number]  SUBJECTIVE:  74 y.o. G2P0020 female here for a breast and pelvic exam. She has no gynecologic concerns.  Current Outpatient Medications  Medication Sig Dispense Refill  . acetaminophen (TYLENOL 8 HOUR) 650 MG CR tablet Take 1 tablet (650 mg total) by mouth every 8 (eight) hours as needed. 30 tablet 0  . aspirin 81 MG chewable tablet Chew 81 mg by mouth daily.     Marland Kitchen atorvastatin (LIPITOR) 80 MG tablet Take 80 mg by mouth daily.    . Brimonidine Tartrate (ALPHAGAN P OP) Apply to eye 2 (two) times daily.     . Calcium Carbonate-Vit D-Min (CALTRATE 600+D PLUS PO) Take 1 tablet by mouth 2 (two) times daily.     . Cholecalciferol (VITAMIN D) 1000 UNITS capsule Take 1,000 Units by mouth daily.     . dorzolamide-timolol (COSOPT) 22.3-6.8 MG/ML ophthalmic solution Place 1 drop into both eyes 2 (two) times daily.     Marland Kitchen losartan-hydrochlorothiazide (HYZAAR) 100-25 MG tablet Take 1 tablet by mouth every morning.      No current facility-administered medications for this visit.   Allergies: Patient has no known allergies.  No LMP recorded. Patient is postmenopausal.  Past medical history,surgical history, problem list, medications, allergies, family history and social history were all reviewed and documented as reviewed in the EPIC chart.  GYN ROS: no abnormal bleeding, pelvic pain or discharge, no breast pain or new or enlarging lumps on self exam.  No dysuria, urinary frequency, pain with urination, cloudy/malodorous urine.   OBJECTIVE:  BP 120/78   Ht $R'5\' 4"'HF$  (1.626 m)   Wt 137 lb (62.1 kg)   BMI 23.52 kg/m  The patient appears well, alert, oriented, in no distress.  BREAST EXAM: right breast normal without mass, skin or nipple changes or axillary nodes, prior left mastectomy with well-healed scar and area is without masses or axillary adenopathy  PELVIC EXAM: VULVA: normal appearing vulva with atrophic change, no masses, tenderness or lesions,  VAGINA: normal appearing vagina with atrophic change, normal color and discharge, no lesions, CERVIX: normal appearing atrophic cervix without discharge or lesions, UTERUS: uterus is normal size, shape, consistency and nontender, ADNEXA: normal adnexa in size, nontender and no masses, RECTAL: normal rectal, no masses  Chaperone: [name] present during the examination  ASSESSMENT:  74 y.o. G2P0020 here for a breast and pelvic exam  PLAN:   1. Postmenopausal.  No vaginal bleeding.  No significant menopausal symptoms. 2. Pap smear 2020.  No significant history of abnormal Pap smears.  Option to stop screening based on current guidelines has been reviewed and she would like to continue with screening.  Will revisit at the routine interval every 3 years. 3.  History of BRCA2 s/p left mastectomy.  She does continue with annual mammograms and MRI surveillance.  Mammogram and breast/abdomen 08/2020 (due to family history).  Prior LSO as right ovary was not identified, thought was that it was removed with an topic pregnancy surgery in the past.  She did have an ultrasound in 2016 which showed probable right ovarian remnant.  She has been followed up with annual ultrasounds due to this suspected remnant.  She would like to continue with the annual ultrasound so we will go ahead and order this.  Normal breast exam today/NED.   4. Colonoscopy 2014.  She will follow up at the interval recommended by her GI specialist.   5.  Osteopenia.  DEXA 08/2018.  T score -1.2, FRAX  4.6% / 0.7%.  Next DEXA recommended at the 2-3 year interval given the mild BMD loss, so she would prefer this next year will revisit at that time.  Encouraged weightbearing exercise and discussed calcium and vitamin D.   6. Health maintenance.  No labs today as she normally has these completed elsewhere.  Return annually or sooner, prn.  Joseph Pierini MD 09/11/20

## 2020-09-14 IMAGING — MR MR BILATERAL BREAST WITHOUT AND WITH CONTRAST
6 of 13 series · 19 of 48 positions shown · IV contrast (gadavist)
Comparison: Previous exams including prior breast MRIs, most recent
dated 05/17/2018.

CLINICAL DATA: High risk screening. Greater than 20% lifetime risk
developing additional breast carcinoma. History of a left mastectomy
for breast carcinoma performed in 9115.

LABS:  No labs drawn at time of imaging.
EXAM:
BILATERAL BREAST MRI WITH AND WITHOUT CONTRAST
TECHNIQUE: Multiplanar, multisequence MR images of both breasts were obtained
prior to and following the intravenous administration of 7 ml of
Gadavist

[Series 1: (phone_number) · axial · 7.0mm · 1.56mm/px · 1 of 25 slices shown]
[im 1/25]
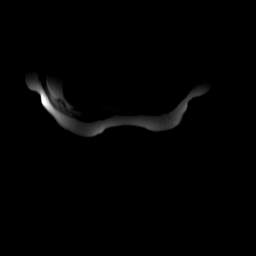

[Series 3: T1 · axial · non-contrast · 1.8mm · 0.62mm/px · z∈[-77,+121]mm · 5 of 220 slices shown]
[im 1/220]
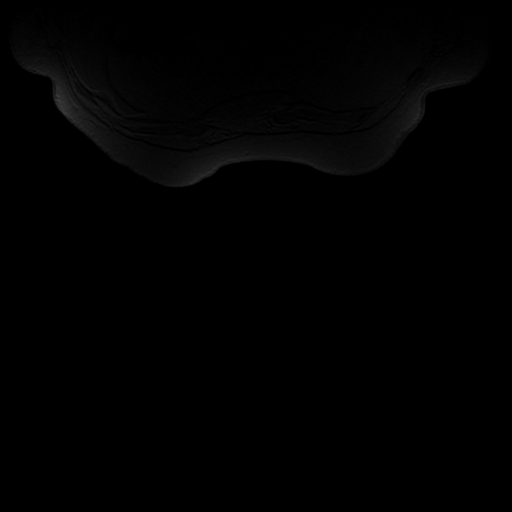
[im 55/220]
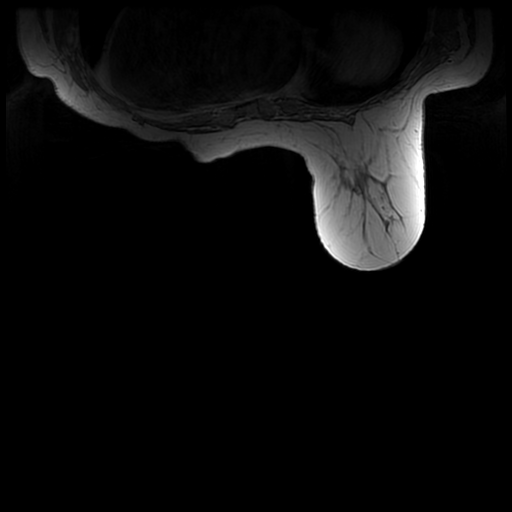
[im 110/220]
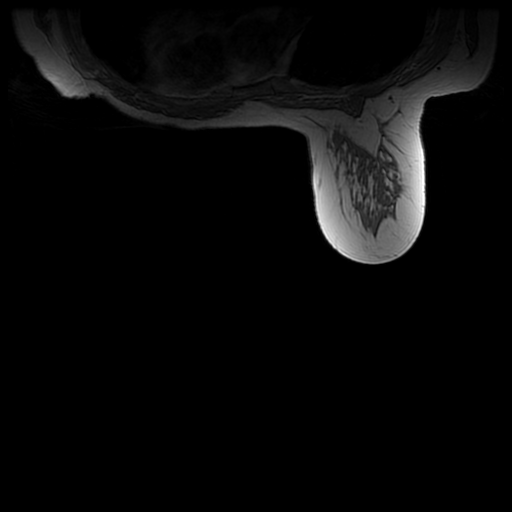
[im 165/220]
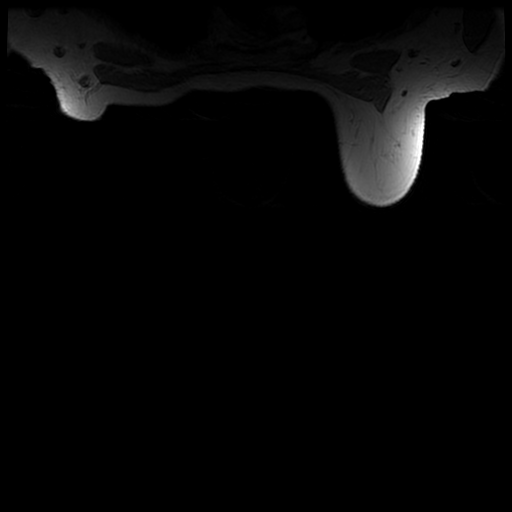
[im 220/220]
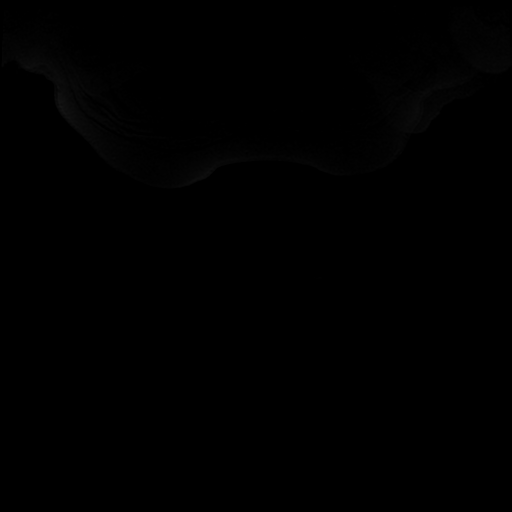

[Series 4: T2 · axial · 3.0mm · 0.62mm/px · 1 of 65 slices shown]
[im 1/65]
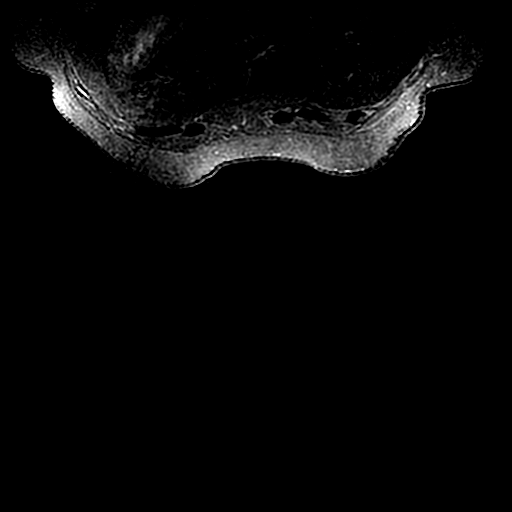

[Series 500: T1 fat-sat · axial · 1.8mm · 0.62mm/px · z∈[-77,+118]mm · 5 of 217 slices shown (1 of 3)]
[im 1/217]
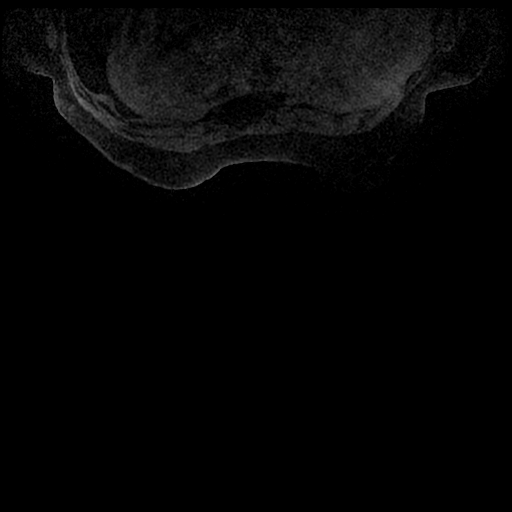
[im 55/217]
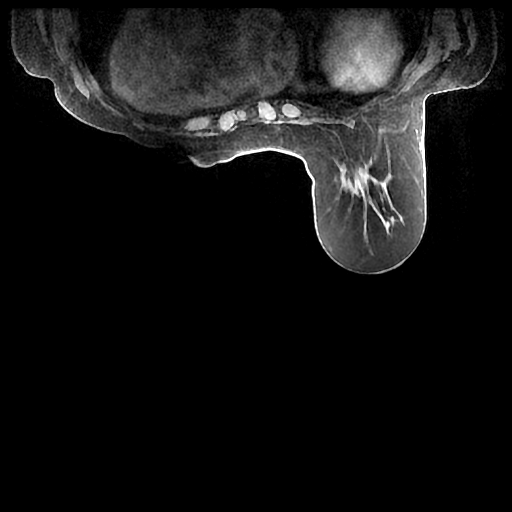
[im 109/217]
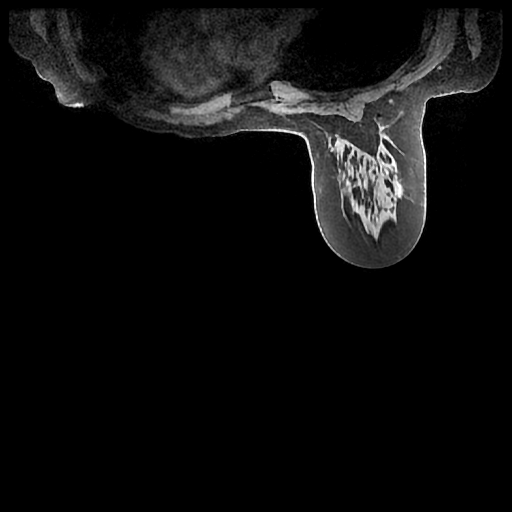
[im 163/217]
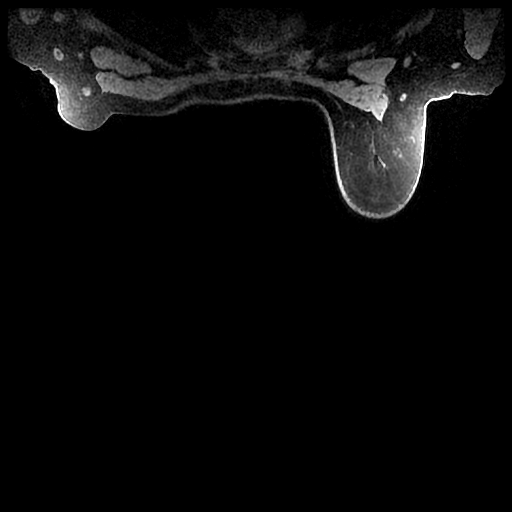
[im 217/217]
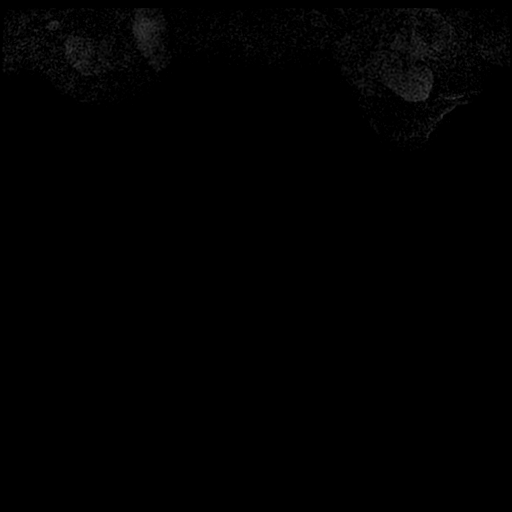

[Series 501: T1 fat-sat · axial · 1.8mm · 0.62mm/px · z∈[-77,+118]mm · 5 of 217 slices shown (2 of 3)]
[im 1/217]
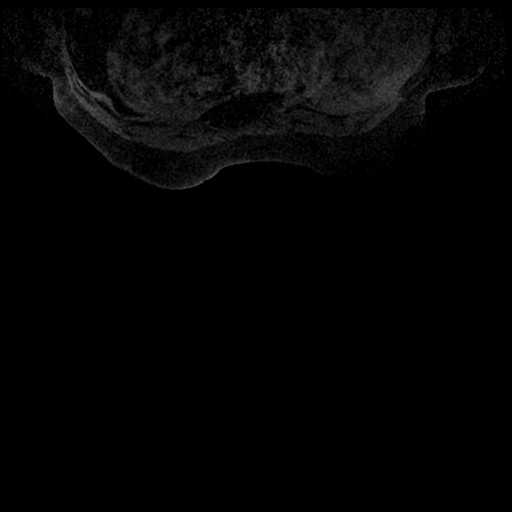
[im 55/217]
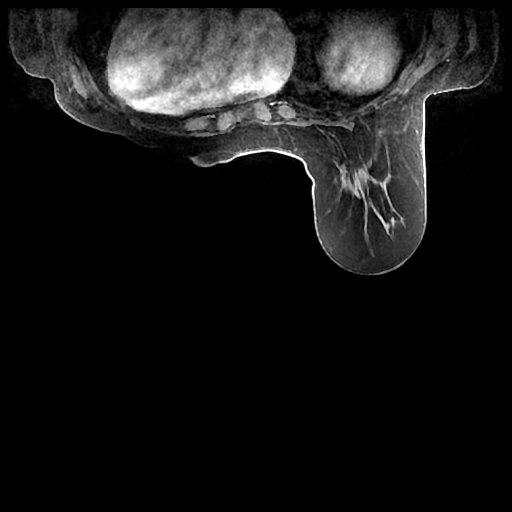
[im 109/217]
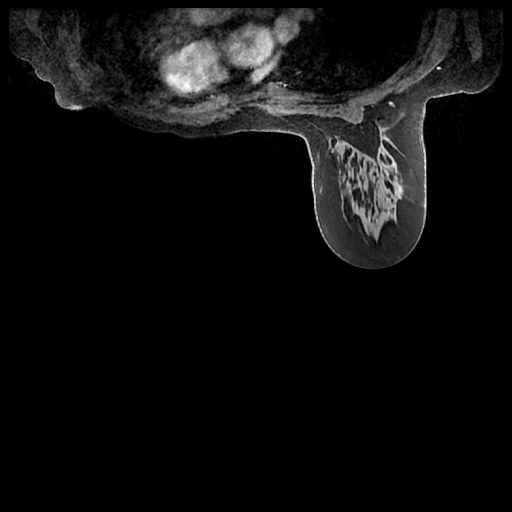
[im 163/217]
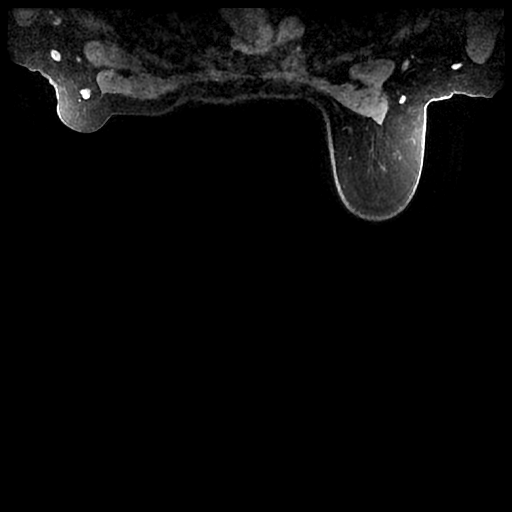
[im 217/217]
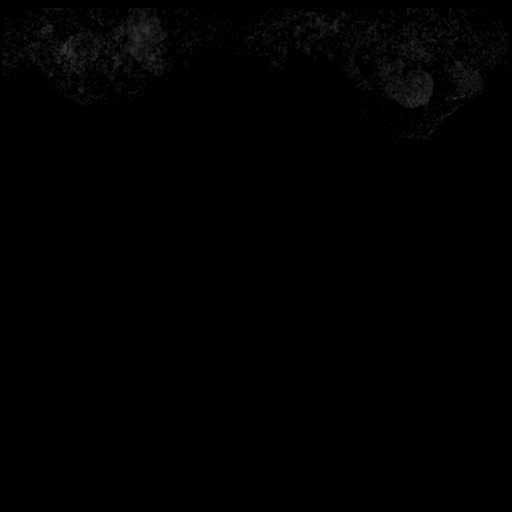

[Series 502: T1 fat-sat · axial · 1.8mm · 0.62mm/px · z∈[-77,-28]mm · 2 of 217 slices shown (3 of 3)]
[im 1/217]
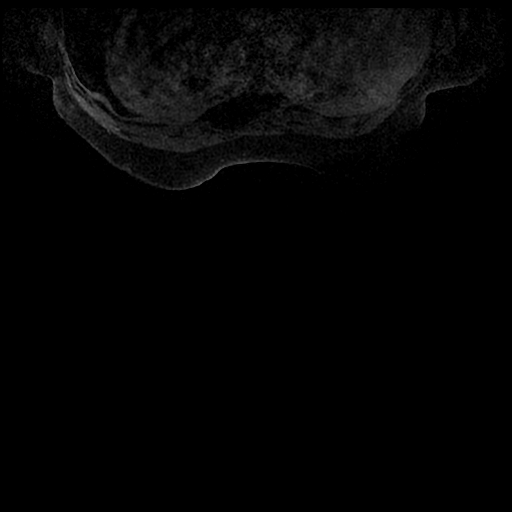
[im 55/217]
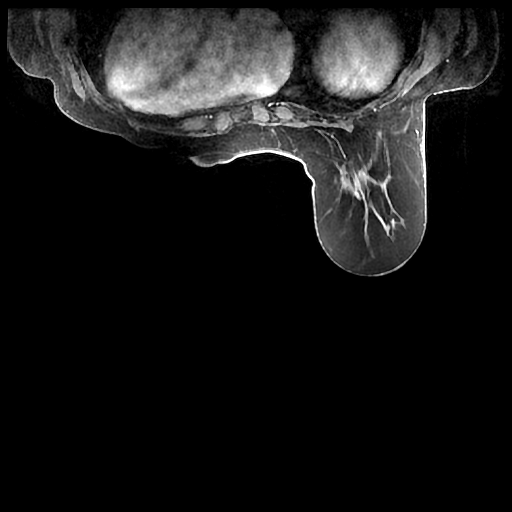

[19 of 48 positions shown; findings below may reference images not displayed]

Three-dimensional MR images were rendered by post-processing of the
original MR data on an independent workstation. The
three-dimensional MR images were interpreted, and findings are
reported in the following complete MRI report for this study. Three
dimensional images were evaluated at the independent [employer]
workstation
FINDINGS: Breast composition: c. Heterogeneous fibroglandular tissue.

Background parenchymal enhancement: Mild

Right breast: No mass or abnormal enhancement.

Left breast: Surgically absent. No mass or abnormal enhancement
along the mastectomy bed/remaining left breast soft tissues.

Lymph nodes: No abnormal appearing lymph nodes.

Ancillary findings:  None.
IMPRESSION: 1. No evidence of breast carcinoma.
2. Status post left mastectomy.

RECOMMENDATION:
1. Annual screening mammography. (Code:[id number])
2. Recommend annual screening breast MRI given the patient's greater
than 20% lifetime risk for developing additional breast carcinoma
and her heterogeneously dense breast architecture on mammography.

BI-RADS CATEGORY  1: Negative.

## 2020-09-27 ENCOUNTER — Other Ambulatory Visit: Payer: Self-pay

## 2020-09-27 ENCOUNTER — Ambulatory Visit (INDEPENDENT_AMBULATORY_CARE_PROVIDER_SITE_OTHER): Payer: Medicare Other

## 2020-09-27 ENCOUNTER — Encounter: Payer: Self-pay | Admitting: Obstetrics and Gynecology

## 2020-09-27 ENCOUNTER — Ambulatory Visit (INDEPENDENT_AMBULATORY_CARE_PROVIDER_SITE_OTHER): Payer: Medicare Other | Admitting: Obstetrics and Gynecology

## 2020-09-27 VITALS — BP 124/80

## 2020-09-27 DIAGNOSIS — Z1501 Genetic susceptibility to malignant neoplasm of breast: Secondary | ICD-10-CM

## 2020-09-27 DIAGNOSIS — N9983 Residual ovary syndrome: Secondary | ICD-10-CM | POA: Diagnosis not present

## 2020-09-27 DIAGNOSIS — Z1509 Genetic susceptibility to other malignant neoplasm: Secondary | ICD-10-CM

## 2020-09-27 DIAGNOSIS — Z01419 Encounter for gynecological examination (general) (routine) without abnormal findings: Secondary | ICD-10-CM

## 2020-09-27 DIAGNOSIS — Z853 Personal history of malignant neoplasm of breast: Secondary | ICD-10-CM

## 2020-09-27 DIAGNOSIS — N854 Malposition of uterus: Secondary | ICD-10-CM | POA: Diagnosis not present

## 2020-09-27 NOTE — Progress Notes (Signed)
   GERRICA CYGAN 1946/10/26 [id number]  SUBJECTIVE:  74 y.o. G2P0020  Female with a history of BRCA2 and breast cancer presents for a pelvic ultrasound for surveillance of the adnexa as there was mentioned to be a right ovarian remnant noted on previous ultrasounds.  She has been followed with annual pelvic ultrasounds to monitor the adnexa given the BRCA2 mutation.  OBJECTIVE:  BP 124/80   Pelvic ultrasound Retroverted uterus 4.2 x 2.2 x 2.1 cm.  Atrophic. Symmetrical endometrium measuring 2.4 mm. No evidence of ovarian remnant visualized on today's ultrasound.  Ovaries and surgically absent. Adnexal area without masses.  No free fluid.   ASSESSMENT:  74 y.o. G28P0020 female with BRCA2 here for pelvic ultrasound surveillance  PLAN:  The patient is reassured by the normal pelvic ultrasound and there is an ovarian remnant identified today.  We will follow up at her next regular routine annual visit to see if she desires to continue with these ultrasound screenings.  Joseph Pierini MD 09/27/20

## 2020-10-01 DIAGNOSIS — H401122 Primary open-angle glaucoma, left eye, moderate stage: Secondary | ICD-10-CM | POA: Diagnosis not present

## 2020-10-01 DIAGNOSIS — H401112 Primary open-angle glaucoma, right eye, moderate stage: Secondary | ICD-10-CM | POA: Diagnosis not present

## 2020-11-02 DIAGNOSIS — M859 Disorder of bone density and structure, unspecified: Secondary | ICD-10-CM | POA: Diagnosis not present

## 2020-11-02 DIAGNOSIS — E7800 Pure hypercholesterolemia, unspecified: Secondary | ICD-10-CM | POA: Diagnosis not present

## 2020-11-09 DIAGNOSIS — H9319 Tinnitus, unspecified ear: Secondary | ICD-10-CM | POA: Diagnosis not present

## 2020-11-09 DIAGNOSIS — F321 Major depressive disorder, single episode, moderate: Secondary | ICD-10-CM | POA: Diagnosis not present

## 2020-11-09 DIAGNOSIS — I6523 Occlusion and stenosis of bilateral carotid arteries: Secondary | ICD-10-CM | POA: Diagnosis not present

## 2020-11-09 DIAGNOSIS — Z0000 Encounter for general adult medical examination without abnormal findings: Secondary | ICD-10-CM | POA: Diagnosis not present

## 2020-11-09 DIAGNOSIS — M8580 Other specified disorders of bone density and structure, unspecified site: Secondary | ICD-10-CM | POA: Diagnosis not present

## 2020-11-09 DIAGNOSIS — H4010X0 Unspecified open-angle glaucoma, stage unspecified: Secondary | ICD-10-CM | POA: Diagnosis not present

## 2020-11-09 DIAGNOSIS — I10 Essential (primary) hypertension: Secondary | ICD-10-CM | POA: Diagnosis not present

## 2020-11-09 DIAGNOSIS — R82998 Other abnormal findings in urine: Secondary | ICD-10-CM | POA: Diagnosis not present

## 2020-11-09 DIAGNOSIS — K5900 Constipation, unspecified: Secondary | ICD-10-CM | POA: Diagnosis not present

## 2020-11-09 DIAGNOSIS — G4700 Insomnia, unspecified: Secondary | ICD-10-CM | POA: Diagnosis not present

## 2020-11-09 DIAGNOSIS — G43C0 Periodic headache syndromes in child or adult, not intractable: Secondary | ICD-10-CM | POA: Diagnosis not present

## 2020-11-09 DIAGNOSIS — Z853 Personal history of malignant neoplasm of breast: Secondary | ICD-10-CM | POA: Diagnosis not present

## 2020-11-09 DIAGNOSIS — E7800 Pure hypercholesterolemia, unspecified: Secondary | ICD-10-CM | POA: Diagnosis not present

## 2020-11-13 DIAGNOSIS — Z1212 Encounter for screening for malignant neoplasm of rectum: Secondary | ICD-10-CM | POA: Diagnosis not present

## 2020-11-24 HISTORY — PX: OTHER SURGICAL HISTORY: SHX169

## 2020-11-27 ENCOUNTER — Ambulatory Visit (INDEPENDENT_AMBULATORY_CARE_PROVIDER_SITE_OTHER): Payer: Medicare Other | Admitting: Vascular Surgery

## 2020-11-27 ENCOUNTER — Ambulatory Visit (HOSPITAL_COMMUNITY)
Admission: RE | Admit: 2020-11-27 | Discharge: 2020-11-27 | Disposition: A | Discharge status: Home / Self Care | Payer: Medicare Other | Source: Ambulatory Visit | Attending: Vascular Surgery | Admitting: Vascular Surgery

## 2020-11-27 ENCOUNTER — Encounter: Payer: Self-pay | Admitting: Vascular Surgery

## 2020-11-27 ENCOUNTER — Other Ambulatory Visit: Payer: Self-pay

## 2020-11-27 VITALS — BP 146/74 | HR 72 | Temp 98.0°F | Resp 14 | Ht 65.0 in | Wt 133.0 lb

## 2020-11-27 DIAGNOSIS — I6523 Occlusion and stenosis of bilateral carotid arteries: Secondary | ICD-10-CM | POA: Insufficient documentation

## 2020-11-27 NOTE — Progress Notes (Signed)
Patient name: [name] MRN: [id number] DOB: June 08, 1946 Sex: female  REASON FOR CONSULT: 6 month follow-up for surveillance of bilateral carotid artery stenosis  HPI: [name] is a 75 y.o. female, with history of hypertension, hyperlipidemia, history of breast cancer that presents for 6 month follow-up for surveillance of bilateral carotid artery stenosis.  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  She states ultimately after she discussed this with her PCP this prompted a ultrasound of her carotid arteries.  There was concern for 60 to 79% stenosis in the right ICA with 40 to 59% stenosis in the left ICA after duplex.  Our repeat duplex here only demonstrated 40-59% stenosis bilaterally.  On follow-up today, she denies any recent neurologic events.  Did have glaucoma surgery to her left eye.  Past Medical History:  Diagnosis Date  . 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 COVID-19 10/2019  . Glaucoma   . History of cystitis   . 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 Hyperlipidemia   . Hypertension   . Osteopenia 08/2018   T score -1.2 FRAX 4.6% / 0.7%    Past Surgical History:  Procedure Laterality Date  . BILATERAL SALPINGOOPHORECTOMY  9.6.12   Left side removed. Right tube and ovary never identified  . BREAST SURGERY  08-29-2005   TOTAL LEFT MASTECTOMY W/ NODE DISSECTION X4  . CARPAL TUNNEL RELEASE  2007   RIGHT  . CATARACT EXTRACTION     Right eye  . CATARACT EXTRACTION Left 07/2020  . DILATION AND CURETTAGE OF UTERUS  1968  . dx laparoscopy  9.6.12   resulted into open salpingo-opphorectomy   . ECTOPIC PREGNANCY SURGERY  73 & 74  . EYE SURGERY  2001   RIGHT EYE FOR GLAUCOMA  . HAND SURGERY    . HYSTEROSCOPY WITH D & C  11/05/2012    Procedure: DILATATION AND CURETTAGE /HYSTEROSCOPY;  Surgeon: Bennetta Laos, MD;  Location: [facility];  Service: Gynecology;  Laterality: N/A;  . PORTA CATH    . TRANSTHORACIC ECHOCARDIOGRAM  10-14-2005   LVSF NORMAL/ EF 55-65%/ MILD MITRAL REGURG    Family History  Problem Relation Age of Onset  . Hypertension Mother   . Cancer Father        COLON  . Breast cancer Sister        Age 76's  . Diabetes Sister   . Stroke Sister   . Breast cancer Sister        Age 15's  . Stroke Sister   . Breast cancer Sister        Age 31's  . Breast cancer Paternal Grandmother        Age unknown  . Cancer Paternal Grandfather        Unknown type    SOCIAL HISTORY: Social History   Socioeconomic History  . Marital status: Married    Spouse name: Not on file  . Number of children: Not on file  . Years of education: Not on file  . Highest education level: Not on file  Occupational History  . Not on file  Tobacco Use  . Smoking status: Former Research scientist (life sciences)  . Smokeless tobacco: Never Used  .  Tobacco comment: smoking for a few yrs >5  , quit many yrs ago  Vaping Use  . Vaping Use: Never used  Substance and Sexual Activity  . Alcohol use: No    Alcohol/week: 0.0 standard drinks  . Drug use: No  . Sexual activity: Yes    Partners: Male    Birth control/protection: Post-menopausal    Comment: 1st intercourse 20 yo-1 partner  Other Topics Concern  . Not on file  Social History Narrative  . Not on file   Social Determinants of Health   Financial Resource Strain: Not on file  Food Insecurity: Not on file  Transportation Needs: Not on file  Physical Activity: Not on file  Stress: Not on file  Social Connections: Not on file  Intimate Partner Violence: Not on file    No Known Allergies  Current Outpatient Medications  Medication Sig Dispense Refill  . acetaminophen (TYLENOL 8 HOUR) 650 MG CR tablet Take 1 tablet (650 mg total) by mouth every 8 (eight) hours as  needed. 30 tablet 0  . aspirin 81 MG chewable tablet Chew 81 mg by mouth daily.     . atorvastatin (LIPITOR) 80 MG tablet Take 80 mg by mouth daily.    . Brimonidine Tartrate (ALPHAGAN P OP) Apply to eye 2 (two) times daily.    . Calcium Carbonate-Vit D-Min (CALTRATE 600+D PLUS PO) Take 1 tablet by mouth 2 (two) times daily.     . Cholecalciferol (VITAMIN D) 1000 UNITS capsule Take 1,000 Units by mouth daily.    . dorzolamide-timolol (COSOPT) 22.3-6.8 MG/ML ophthalmic solution Place 1 drop into both eyes 2 (two) times daily.    . losartan-hydrochlorothiazide (HYZAAR) 100-25 MG tablet Take 1 tablet by mouth every morning.     No current facility-administered medications for this visit.    REVIEW OF SYSTEMS:  [X] denotes positive finding, [ ] denotes negative finding Cardiac  Comments:  Chest pain or chest pressure:    Shortness of breath upon exertion:    Short of breath when lying flat:    Irregular heart rhythm:        Vascular    Pain in calf, thigh, or hip brought on by ambulation:    Pain in feet at night that wakes you up from your sleep:     Blood clot in your veins:    Leg swelling:         Pulmonary    Oxygen at home:    Productive cough:     Wheezing:         Neurologic    Sudden weakness in arms or legs:     Sudden numbness in arms or legs:     Sudden onset of difficulty speaking or slurred speech:    Temporary loss of vision in one eye:     Problems with dizziness:         Gastrointestinal    Blood in stool:     Vomited blood:         Genitourinary    Burning when urinating:     Blood in urine:        Psychiatric    Major depression:         Hematologic    Bleeding problems:    Problems with blood clotting too easily:        Skin    Rashes or ulcers:        Constitutional    Fever or chills:        PHYSICAL EXAM: Vitals:   11/27/20 1335  BP: (!) 146/74  Pulse: 72  Resp: 14  Temp: 98 F (36.7 C)  TempSrc: Temporal  SpO2: 98%  Weight: 133  lb (60.3 kg)  Height: 5' 5" (1.651 m)    GENERAL: The patient is a well-nourished female, in no acute distress. The vital signs are documented above. CARDIAC: There is a regular rate and rhythm.  PULMONARY: no respiratory distress. ABDOMEN: Soft and non-tender. MUSCULOSKELETAL: There are no major deformities or cyanosis. NEUROLOGIC: No focal weakness or paresthesias are detected.  CN II-XII grossly intact SKIN: There are no ulcers or rashes noted. PSYCHIATRIC: The patient has a normal affect.  DATA:   Repeat duplex here today shows 40-59% ICA stenosis bilaterally which is stable.   Assessment/Plan:  74-year-old female presents for 6-month follow-up of her bilateral carotid artery stenosis.  Repeat duplex today again demonstrates a stable 40 to 59% stenosis in bilateral ICAs.  I discussed again with her that current recommendations are medical management and reserve surgery for greater than 80% stenosis in asymptomatic disease.  I will have her follow-up again in 6 months with a repeat carotid duplex given initial concern for a moderate right ICA stenosis (at time of initial referral).    Christopher J. Clark, MD [facility] Office: [phone]     

## 2020-11-28 ENCOUNTER — Other Ambulatory Visit: Payer: Self-pay

## 2020-11-28 DIAGNOSIS — I6523 Occlusion and stenosis of bilateral carotid arteries: Secondary | ICD-10-CM

## 2020-12-03 DIAGNOSIS — H401112 Primary open-angle glaucoma, right eye, moderate stage: Secondary | ICD-10-CM | POA: Diagnosis not present

## 2020-12-03 DIAGNOSIS — H401122 Primary open-angle glaucoma, left eye, moderate stage: Secondary | ICD-10-CM | POA: Diagnosis not present

## 2020-12-31 DIAGNOSIS — H401133 Primary open-angle glaucoma, bilateral, severe stage: Secondary | ICD-10-CM | POA: Diagnosis not present

## 2021-01-30 ENCOUNTER — Encounter: Payer: Self-pay | Admitting: Nurse Practitioner

## 2021-01-30 ENCOUNTER — Other Ambulatory Visit: Payer: Self-pay

## 2021-01-30 ENCOUNTER — Ambulatory Visit (INDEPENDENT_AMBULATORY_CARE_PROVIDER_SITE_OTHER): Payer: Medicare Other | Admitting: Nurse Practitioner

## 2021-01-30 VITALS — BP 134/90

## 2021-01-30 DIAGNOSIS — R300 Dysuria: Secondary | ICD-10-CM | POA: Diagnosis not present

## 2021-01-30 DIAGNOSIS — N898 Other specified noninflammatory disorders of vagina: Secondary | ICD-10-CM | POA: Diagnosis not present

## 2021-01-30 DIAGNOSIS — N952 Postmenopausal atrophic vaginitis: Secondary | ICD-10-CM | POA: Diagnosis not present

## 2021-01-30 LAB — URINALYSIS, COMPLETE W/RFL CULTURE
Bacteria, UA: NONE SEEN /HPF
Bilirubin Urine: NEGATIVE
Glucose, UA: NEGATIVE
Hgb urine dipstick: NEGATIVE
Hyaline Cast: NONE SEEN /LPF
Ketones, ur: NEGATIVE
Leukocyte Esterase: NEGATIVE
Nitrites, Initial: NEGATIVE
Protein, ur: NEGATIVE
RBC / HPF: NONE SEEN /HPF (ref 0–2)
Specific Gravity, Urine: 1.02 (ref 1.001–1.03)
WBC, UA: NONE SEEN /HPF (ref 0–5)
pH: 6 (ref 5.0–8.0)

## 2021-01-30 LAB — WET PREP FOR TRICH, YEAST, CLUE

## 2021-01-30 LAB — NO CULTURE INDICATED

## 2021-01-30 NOTE — Progress Notes (Signed)
   Acute Office Visit  Subjective:    Patient ID: [name], female    DOB: 26-Jan-1946, 75 y.o.   MRN: [id number]   HPI 75 y.o. presents today for burning with urination and vaginal itching that started 3-4 weeks ago. She also has urinary frequency but says this is not new for her and does not seem worse than usual.    Review of Systems  Constitutional: Negative.   Gastrointestinal: Negative.   Genitourinary: Positive for dysuria. Negative for hematuria, urgency and vaginal discharge.       Vaginal itching       Objective:    Physical Exam Constitutional:      Appearance: Normal appearance.  Abdominal:     Tenderness: There is no right CVA tenderness or left CVA tenderness.  Genitourinary:    General: Normal vulva.     Vagina: Normal.     BP 134/90  Wt Readings from Last 3 Encounters:  11/27/20 133 lb (60.3 kg)  09/11/20 137 lb (62.1 kg)  05/23/20 142 lb 14.4 oz (64.8 kg)   UA negative Wet prep     Assessment & Plan:   Problem List Items Addressed This Visit   None   Visit Diagnoses    Atrophic vaginitis    -  Primary   Burning with urination       Relevant Orders   Urinalysis,Complete w/RFL Culture   Vaginal itching       Relevant Orders   WET PREP FOR [place], YEAST, CLUE     Plan: Reassurance provided on normal UA, wet prep, and exam. We discussed atrophic vaginitis and management for this. I recommended applying oil-based lubricant, such as coconut oil externally. If symptoms worsen or do not improve she will return to office. She is agreeable to plan.      Tamela Gammon [facility], 3:02 PM 01/30/2021

## 2021-01-30 NOTE — Patient Instructions (Signed)
Atrophic Vaginitis  Atrophic vaginitis is a condition in which the tissues that line the vagina become dry and thin. This condition is most common in women who have stopped having regular menstrual periods (are in menopause). This usually starts when a woman is 74 to 77 years old. That is the time when a woman's estrogen levels begin to decrease. Estrogen is a female hormone. It helps to keep the tissues of the vagina moist. It stimulates the vagina to produce a clear fluid that lubricates the vagina for sex. This fluid also protects the vagina from infection. Lack of estrogen can cause the lining of the vagina to get thinner and dryer. The vagina may also shrink in size. It may become less elastic. Atrophic vaginitis tends to get worse over time as a woman's estrogen level drops. What are the causes? This condition is caused by the normal drop in estrogen that happens around the time of menopause. What increases the risk? Certain conditions or situations may lower a woman's estrogen level, leading to a higher risk for atrophic vaginitis. You are more likely to develop this condition if:  You are taking medicines that block estrogen.  You have had your ovaries removed.  You are being treated for cancer with radiation or medicines (chemotherapy).  You have given birth or are breastfeeding.  You are older than age 68.  You smoke. What are the signs or symptoms? Symptoms of this condition include:  Pain, soreness, a feeling of pressure, or bleeding during sex (dyspareunia).  Vaginal burning, irritation, or itching.  Pain or bleeding when a speculum is used in a vaginal exam.  Having burning pain while urinating.  Vaginal discharge. In some cases, there are no symptoms. How is this diagnosed? This condition is diagnosed based on your medical history and a physical exam. This will include a pelvic exam that checks the vaginal tissues. Though rare, you may also have other tests,  including:  A urine test.  A test that checks the acid balance in your vagina (acid balance test). How is this treated? Treatment for this condition depends on how severe your symptoms are. Treatment may include:  Using an over-the-counter vaginal lubricant before sex.  Using a long-acting vaginal moisturizer.  Using low-dose estrogen for moderate to severe symptoms that do not respond to other treatments. Options include creams, tablets, and inserts (vaginal rings). Before you use a vaginal estrogen, tell your health care provider if you have a history of: ? Breast cancer. ? Endometrial cancer. ? Blood clots. If you are not sexually active and your symptoms are very mild, you may not need treatment. Follow these instructions at home: Medicines  Take over-the-counter and prescription medicines only as told by your health care provider.  Do not use herbal or alternative medicines unless your health care provider says that you can.  Use over-the-counter creams, lubricants, or moisturizers for dryness only as told by your health care provider. General instructions  If your atrophic vaginitis is caused by menopause, discuss all of your menopause symptoms and treatment options with your health care provider.  Do not douche.  Do not use products that can make your vagina dry. These include: ? Scented feminine sprays. ? Scented tampons. ? Scented soaps.  Vaginal sex can help to improve blood flow and elasticity of vaginal tissue. If you choose to have sex and it hurts, try using a water-soluble lubricant or moisturizer right before having sex. Contact a health care provider if:  Your discharge looks  different than normal.  Your vagina has an unusual smell.  You have new symptoms.  Your symptoms do not improve with treatment.  Your symptoms get worse. Summary  Atrophic vaginitis is a condition in which the tissues that line the vagina become dry and thin. It is most common  in women who have stopped having regular menstrual periods (are in menopause).  Treatment options include using vaginal lubricants and low-dose vaginal estrogen.  Contact a health care provider if your vagina has an unusual smell, or if your symptoms get worse or do not improve after treatment. This information is not intended to replace advice given to you by your health care provider. Make sure you discuss any questions you have with your health care provider. Document Revised: 05/10/2020 Document Reviewed: 05/10/2020 Elsevier Patient Education  [place].

## 2021-02-04 DIAGNOSIS — I10 Essential (primary) hypertension: Secondary | ICD-10-CM | POA: Diagnosis not present

## 2021-04-01 DIAGNOSIS — H401133 Primary open-angle glaucoma, bilateral, severe stage: Secondary | ICD-10-CM | POA: Diagnosis not present

## 2021-05-02 ENCOUNTER — Other Ambulatory Visit: Payer: Self-pay | Admitting: *Deleted

## 2021-05-02 DIAGNOSIS — C50312 Malignant neoplasm of lower-inner quadrant of left female breast: Secondary | ICD-10-CM

## 2021-05-08 DIAGNOSIS — I10 Essential (primary) hypertension: Secondary | ICD-10-CM | POA: Diagnosis not present

## 2021-05-08 DIAGNOSIS — E7800 Pure hypercholesterolemia, unspecified: Secondary | ICD-10-CM | POA: Diagnosis not present

## 2021-05-08 DIAGNOSIS — Z1331 Encounter for screening for depression: Secondary | ICD-10-CM | POA: Diagnosis not present

## 2021-05-08 DIAGNOSIS — G4700 Insomnia, unspecified: Secondary | ICD-10-CM | POA: Diagnosis not present

## 2021-05-08 DIAGNOSIS — I6523 Occlusion and stenosis of bilateral carotid arteries: Secondary | ICD-10-CM | POA: Diagnosis not present

## 2021-05-08 DIAGNOSIS — G43C0 Periodic headache syndromes in child or adult, not intractable: Secondary | ICD-10-CM | POA: Diagnosis not present

## 2021-05-08 DIAGNOSIS — D638 Anemia in other chronic diseases classified elsewhere: Secondary | ICD-10-CM | POA: Diagnosis not present

## 2021-05-08 DIAGNOSIS — M8580 Other specified disorders of bone density and structure, unspecified site: Secondary | ICD-10-CM | POA: Diagnosis not present

## 2021-05-08 DIAGNOSIS — N3010 Interstitial cystitis (chronic) without hematuria: Secondary | ICD-10-CM | POA: Diagnosis not present

## 2021-05-08 DIAGNOSIS — Z1339 Encounter for screening examination for other mental health and behavioral disorders: Secondary | ICD-10-CM | POA: Diagnosis not present

## 2021-05-08 DIAGNOSIS — F321 Major depressive disorder, single episode, moderate: Secondary | ICD-10-CM | POA: Diagnosis not present

## 2021-05-09 ENCOUNTER — Ambulatory Visit
Admission: RE | Admit: 2021-05-09 | Discharge: 2021-05-09 | Disposition: A | Discharge status: Home / Self Care | Payer: Medicare Other | Source: Ambulatory Visit | Attending: Hematology and Oncology | Admitting: Hematology and Oncology

## 2021-05-09 DIAGNOSIS — C50312 Malignant neoplasm of lower-inner quadrant of left female breast: Secondary | ICD-10-CM

## 2021-05-10 ENCOUNTER — Other Ambulatory Visit: Payer: Self-pay | Admitting: Hematology and Oncology

## 2021-05-10 DIAGNOSIS — C50312 Malignant neoplasm of lower-inner quadrant of left female breast: Secondary | ICD-10-CM

## 2021-05-16 ENCOUNTER — Other Ambulatory Visit: Payer: Medicare Other

## 2021-05-16 ENCOUNTER — Other Ambulatory Visit: Payer: Self-pay

## 2021-05-16 ENCOUNTER — Ambulatory Visit
Admission: RE | Admit: 2021-05-16 | Discharge: 2021-05-16 | Disposition: A | Discharge status: Home / Self Care | Payer: Medicare Other | Source: Ambulatory Visit | Attending: Hematology and Oncology | Admitting: Hematology and Oncology

## 2021-05-16 DIAGNOSIS — Z170 Estrogen receptor positive status [ER+]: Secondary | ICD-10-CM

## 2021-05-16 DIAGNOSIS — C50312 Malignant neoplasm of lower-inner quadrant of left female breast: Secondary | ICD-10-CM

## 2021-05-16 DIAGNOSIS — Z803 Family history of malignant neoplasm of breast: Secondary | ICD-10-CM | POA: Diagnosis not present

## 2021-05-16 DIAGNOSIS — R928 Other abnormal and inconclusive findings on diagnostic imaging of breast: Secondary | ICD-10-CM | POA: Diagnosis not present

## 2021-05-16 MED ORDER — GADOBUTROL 1 MMOL/ML IV SOLN
6.0000 mL | Freq: Once | INTRAVENOUS | Status: AC | PRN
  Administered 2021-05-16: 6 mL via INTRAVENOUS

## 2021-05-22 ENCOUNTER — Ambulatory Visit: Payer: Medicare Other

## 2021-05-23 ENCOUNTER — Ambulatory Visit: Payer: Medicare Other

## 2021-05-23 NOTE — Progress Notes (Signed)
Patient Care Team: Tisovec, [name], MD as PCP - General (Internal Medicine) Nicholas Lose, MD as Consulting Physician (Hematology and Oncology) Ronald Lobo, MD as Consulting Physician (Gastroenterology) Bond, Tracie Harrier, MD as Referring Physician (Ophthalmology) Joseph Pierini, MD as Consulting Physician (Obstetrics and Gynecology)  DIAGNOSIS:    ICD-10-CM   1. 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 with above-mentioned history of left breast cancer treated with mastectomy, adjuvant chemotherapy, and anti-estrogen therapy for 7 years. MRI Breast on 05/16/21 shows no evidence of malignancy bilaterally. She reports to the clinic today for follow-up.  Her major complaint today is frequent cystitis especially when she eats citrus fruits as well as spicy food.  She uses Azo to calm her cystitis.  She has been eating less food over time and had lost about 20 pounds in the last 2 years.  ALLERGIES:  has No Known Allergies.  MEDICATIONS:  Current Outpatient Medications  Medication Sig Dispense Refill    acetaminophen (TYLENOL 8 HOUR) 650 MG CR tablet Take 1 tablet (650 mg total) by mouth every 8 (eight) hours as needed. 30 tablet 0   aspirin 81 MG chewable tablet Chew 81 mg by mouth daily.      atorvastatin (LIPITOR) 80 MG tablet Take 80 mg by mouth daily.     Brimonidine Tartrate (ALPHAGAN P OP) Apply to eye 2 (two) times daily.     Calcium Carbonate-Vit D-Min (CALTRATE 600+D PLUS PO) Take 1 tablet by mouth 2 (two) times daily.      Cholecalciferol (VITAMIN D) 1000 UNITS capsule Take 1,000 Units by mouth daily.     dorzolamide-timolol (COSOPT) 22.3-6.8 MG/ML ophthalmic solution Place 1 drop into both eyes 2 (two) times daily.     losartan-hydrochlorothiazide (HYZAAR) 100-25 MG tablet Take 1 tablet by mouth every morning.     No current facility-administered medications for this visit.    PHYSICAL EXAMINATION: ECOG PERFORMANCE STATUS: 1 - Symptomatic but completely ambulatory  Vitals:   05/24/21 1013  BP: (!) 141/72  Pulse: 76  Resp: 16  Temp: 97.6 F (36.4 C)  SpO2: 99%   Filed Weights   05/24/21 1013  Weight: 127 lb 5 oz (57.7 kg)    BREAST: No palpable lumps or nodules in the right breast.  Left chest wall mastectomy scar is without any lumps or nodules.. (exam performed in the presence of a chaperone)  LABORATORY DATA:  I have reviewed the data as listed CMP Latest Ref Rng & Units 05/18/2019 10/26/2015 10/25/2014  Glucose 70 - 140 mg/dl - 94 99  BUN 7.0 - 26.0  mg/dL - 11.8 16.9  Creatinine 0.44 - 1.00 mg/dL 0.80 0.8 0.9  Sodium 136 - 145 mEq/L - 143 143  Potassium 3.5 - 5.1 mEq/L - 3.7 4.0  Chloride 98 - 107 mEq/L - - -  CO2 22 - 29 mEq/L - 25 26  Calcium 8.4 - 10.4 mg/dL - 9.4 10.1  Total Protein 6.4 - 8.3 g/dL - 7.5 7.8  Total Bilirubin 0.20 - 1.20 mg/dL - 0.81 0.53  Alkaline Phos 40 - 150 U/L - 47 59  AST 5 - 34 U/L - 20 22  ALT 0 - 55 U/L - 16 20    Lab Results  Component Value Date   WBC 6.4 10/26/2015   HGB 12.2 10/26/2015   HCT 37.6 10/26/2015   MCV  91.2 10/26/2015   PLT 207 10/26/2015   NEUTROABS 3.3 10/26/2015    ASSESSMENT & PLAN:  Cancer of lower-inner quadrant of left female breast [facility]) Multicentric left breast invasive ductal carcinoma status post mastectomy in October 2006 status post 6 cycles of FEC chemotherapy followed by antiestrogen therapy x8 years, currently on Evista for bone health as well as breast cancer prevention BRCA2 mutation positive   Breast Cancer Surveillance: Has FH of pancreatic cancer:   07/09/2020: MRI abdomen Benign cholelithiasis, we will plan to do MRI of the abdomen every other year. 05/16/2021: MRI breast: Benign breast density category C, I sent an order for another MRI of the breast for next year. 05/24/2021: Breast exam: Benign  Return to clinic in 1 year for follow-up    No orders of the defined types were placed in this encounter.  The patient has a good understanding of the overall plan. she agrees with it. she will call with any problems that may develop before the next visit here.  Total time spent: 20 mins including face to face time and time spent for planning, charting and coordination of care  Rulon Eisenmenger, MD, MPH 05/24/2021  I, Thana Ates, am acting as scribe for Dr. Nicholas Lose.  I have reviewed the above documentation for accuracy and completeness, and I agree with the above.

## 2021-05-23 NOTE — Assessment & Plan Note (Signed)
Multicentric left breast invasive ductal carcinoma status post mastectomy in October 2006 status post 6 cycles of FEC chemotherapy followed by antiestrogen therapy x8 years, currently on Evista for bone health as well as breast cancer prevention BRCA2 mutation positive  Breast Cancer Surveillance: Has FH of pancreatic cancer:   07/09/2020: MRI abdomen Benign cholelithiasis 05/16/2021: MRI breast: Benign breast density category C 05/24/2021: Breast exam: Benign  Return to clinic in 1 year for follow-up

## 2021-05-24 ENCOUNTER — Other Ambulatory Visit: Payer: Self-pay

## 2021-05-24 ENCOUNTER — Inpatient Hospital Stay: Payer: Medicare Other | Attending: Hematology and Oncology | Admitting: Hematology and Oncology

## 2021-05-24 VITALS — BP 141/72 | HR 76 | Temp 97.6°F | Resp 16 | Ht 65.0 in | Wt 127.3 lb

## 2021-05-24 DIAGNOSIS — Z9221 Personal history of antineoplastic chemotherapy: Secondary | ICD-10-CM | POA: Insufficient documentation

## 2021-05-24 DIAGNOSIS — C50312 Malignant neoplasm of lower-inner quadrant of left female breast: Secondary | ICD-10-CM | POA: Diagnosis not present

## 2021-05-24 DIAGNOSIS — N3090 Cystitis, unspecified without hematuria: Secondary | ICD-10-CM | POA: Insufficient documentation

## 2021-05-24 DIAGNOSIS — Z9012 Acquired absence of left breast and nipple: Secondary | ICD-10-CM | POA: Diagnosis not present

## 2021-05-24 DIAGNOSIS — Z1501 Genetic susceptibility to malignant neoplasm of breast: Secondary | ICD-10-CM | POA: Diagnosis not present

## 2021-05-24 DIAGNOSIS — Z170 Estrogen receptor positive status [ER+]: Secondary | ICD-10-CM

## 2021-05-24 DIAGNOSIS — Z1509 Genetic susceptibility to other malignant neoplasm: Secondary | ICD-10-CM | POA: Diagnosis not present

## 2021-05-24 DIAGNOSIS — Z853 Personal history of malignant neoplasm of breast: Secondary | ICD-10-CM | POA: Diagnosis not present

## 2021-05-30 ENCOUNTER — Other Ambulatory Visit (HOSPITAL_BASED_OUTPATIENT_CLINIC_OR_DEPARTMENT_OTHER): Payer: Self-pay

## 2021-05-30 ENCOUNTER — Ambulatory Visit: Payer: Medicare Other | Attending: Internal Medicine

## 2021-05-30 ENCOUNTER — Other Ambulatory Visit: Payer: Self-pay

## 2021-05-30 DIAGNOSIS — Z23 Encounter for immunization: Secondary | ICD-10-CM

## 2021-05-30 MED ORDER — PFIZER-BIONT COVID-19 VAC-TRIS 30 MCG/0.3ML IM SUSP
INTRAMUSCULAR | 0 refills | Status: DC
  Filled 2021-05-30: qty 0.3, 1d supply, fill #0

## 2021-05-30 NOTE — Progress Notes (Signed)
   Covid-19 Vaccination Clinic  Name:  [name]    MRN: [id number] DOB: 1946/04/14  05/30/2021  [name] was observed post Covid-19 immunization for 15 minutes without incident. She was provided with Vaccine Information Sheet and instruction to access the V-Safe system.   [name] was instructed to call 911 with any severe reactions post vaccine: Difficulty breathing  Swelling of face and throat  A fast heartbeat  A bad rash all over body  Dizziness and weakness   Immunizations Administered     Name Date Dose VIS Date Route   PFIZER Comrnaty(Gray TOP) Covid-19 Vaccine 05/30/2021  9:32 AM 0.3 mL 11/01/2020 Intramuscular   Manufacturer: [place]   Lot: [id number]   [place]: [phone]

## 2021-06-03 DIAGNOSIS — H401133 Primary open-angle glaucoma, bilateral, severe stage: Secondary | ICD-10-CM | POA: Diagnosis not present

## 2021-06-11 ENCOUNTER — Other Ambulatory Visit: Payer: Self-pay

## 2021-06-11 ENCOUNTER — Ambulatory Visit: Payer: Medicare Other | Admitting: Vascular Surgery

## 2021-06-11 ENCOUNTER — Ambulatory Visit (HOSPITAL_COMMUNITY)
Admission: RE | Admit: 2021-06-11 | Discharge: 2021-06-11 | Disposition: A | Discharge status: Home / Self Care | Payer: Medicare Other | Source: Ambulatory Visit | Attending: Vascular Surgery | Admitting: Vascular Surgery

## 2021-06-11 DIAGNOSIS — I6523 Occlusion and stenosis of bilateral carotid arteries: Secondary | ICD-10-CM | POA: Insufficient documentation

## 2021-06-18 ENCOUNTER — Ambulatory Visit (INDEPENDENT_AMBULATORY_CARE_PROVIDER_SITE_OTHER): Payer: Medicare Other | Admitting: Vascular Surgery

## 2021-06-18 ENCOUNTER — Encounter: Payer: Self-pay | Admitting: Vascular Surgery

## 2021-06-18 ENCOUNTER — Other Ambulatory Visit: Payer: Self-pay

## 2021-06-18 VITALS — BP 152/81 | HR 72 | Temp 97.8°F | Resp 14 | Ht 65.0 in | Wt 129.0 lb

## 2021-06-18 DIAGNOSIS — I6523 Occlusion and stenosis of bilateral carotid arteries: Secondary | ICD-10-CM

## 2021-06-18 NOTE — Progress Notes (Signed)
Patient name: [name] MRN: [id number] DOB: Jun 29, 1946 Sex: female  REASON FOR CONSULT: 6 month follow-up for surveillance of bilateral carotid artery stenosis  HPI: [name] is a 75 y.o. female, with history of hypertension, hyperlipidemia, history of breast cancer that presents for 6 month follow-up for surveillance of bilateral carotid artery stenosis.  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  [place] byu her PCP showed concern for 60 to 79% stenosis in the right ICA with 40 to 59% stenosis in the left ICA after duplex.  Our repeat duplex here only demonstrated 40-59% stenosis bilaterally.  I did bring her back in 6 months given the initial concern for moderate stenosis.  On follow-up today, she denies any recent neurologic events.  Still has blurriness in the left eye from her glaucoma but he feels this is at baseline.  Past Medical History:  Diagnosis Date   BRCA2 positive 07/01/2011   COVID-19 10/2019   Glaucoma    History of cystitis    Hx Breast cancer, IDC, Left, multifocal, Stage II ER+, PR - Her 2- DX 09/04/2005-  S/P TOTAL LEFT MASTECTOMY WITH NODE DISSECTION   AND CHEMO   ONCOLOGIST-  DR Donnie Coffin--  NO RECURRENCE   Hyperlipidemia    Hypertension    Osteopenia 08/2018   T score -1.2 FRAX 4.6% / 0.7%    Past Surgical History:  Procedure Laterality Date   BILATERAL SALPINGOOPHORECTOMY  9.6.12   Left side removed. Right tube and ovary never identified   BREAST SURGERY  08-29-2005   TOTAL LEFT MASTECTOMY W/ NODE DISSECTION X4   CARPAL TUNNEL RELEASE  2007   RIGHT   CATARACT EXTRACTION     Right eye   CATARACT EXTRACTION Left 07/2020   DILATION AND CURETTAGE OF UTERUS  1968   dx laparoscopy  9.6.12   resulted into open salpingo-opphorectomy    ECTOPIC PREGNANCY SURGERY  73 & 74   EYE SURGERY  2001   RIGHT EYE FOR GLAUCOMA   HAND SURGERY     HYSTEROSCOPY WITH D & C  11/05/2012    Procedure: DILATATION AND CURETTAGE /HYSTEROSCOPY;  Surgeon: Trellis Paganini, MD;  Location: [facility];  Service: Gynecology;  Laterality: N/A;   PORTA CATH     TRANSTHORACIC ECHOCARDIOGRAM  10-14-2005   LVSF NORMAL/ EF 55-65%/ MILD MITRAL REGURG    Family History  Problem Relation Age of Onset   Hypertension Mother    Cancer Father        COLON   Breast cancer Sister        Age 62's   Diabetes Sister    Stroke Sister    Breast cancer Sister        Age 41's   Stroke Sister    Breast cancer Sister        Age 69's   Breast cancer Paternal Grandmother        Age unknown   Cancer Paternal Grandfather        Unknown type    SOCIAL HISTORY: Social History   Socioeconomic History   Marital status: Married    Spouse name: Not on file   Number of children: Not on file   Years of education: Not on file   Highest education level: Not on file  Occupational History   Not on file  Tobacco Use   Smoking status: Former  Smokeless tobacco: Never   Tobacco comments:    smoking for a few yrs >5  , quit many yrs ago  Vaping Use   Vaping Use: Never used  Substance and Sexual Activity   Alcohol use: No    Alcohol/week: 0.0 standard drinks   Drug use: No   Sexual activity: Yes    Partners: Male    Birth control/protection: Post-menopausal    Comment: 1st intercourse 72 yo-1 partner  Other Topics Concern   Not on file  Social History Narrative   Not on file   Social Determinants of Health   Financial Resource Strain: Not on file  Food Insecurity: Not on file  Transportation Needs: Not on file  Physical Activity: Not on file  Stress: Not on file  Social Connections: Not on file  Intimate Partner Violence: Not on file    No Known Allergies  Current Outpatient Medications  Medication Sig Dispense Refill   atorvastatin (LIPITOR) 80 MG tablet Take 80 mg by mouth daily.     Brimonidine Tartrate (ALPHAGAN P OP) Apply to eye 2 (two) times daily.      Calcium Carbonate-Vit D-Min (CALTRATE 600+D PLUS PO) Take 1 tablet by mouth 2 (two) times daily.      Cholecalciferol (VITAMIN D) 1000 UNITS capsule Take 1,000 Units by mouth daily.     COVID-19 mRNA Vac-TriS, Pfizer, (PFIZER-BIONT COVID-19 VAC-TRIS) SUSP injection Inject into the muscle. 0.3 mL 0   dorzolamide-timolol (COSOPT) 22.3-6.8 MG/ML ophthalmic solution Place 1 drop into both eyes 2 (two) times daily.     losartan-hydrochlorothiazide (HYZAAR) 100-25 MG tablet Take 1 tablet by mouth every morning.     No current facility-administered medications for this visit.    REVIEW OF SYSTEMS:  $RemoveB'[X]'xARBiEZp$  denotes positive finding, $RemoveBeforeDEI'[ ]'QUIumGDPJpCoZeCM$  denotes negative finding Cardiac  Comments:  Chest pain or chest pressure:    Shortness of breath upon exertion:    Short of breath when lying flat:    Irregular heart rhythm:        Vascular    Pain in calf, thigh, or hip brought on by ambulation:    Pain in feet at night that wakes you up from your sleep:     Blood clot in your veins:    Leg swelling:         Pulmonary    Oxygen at home:    Productive cough:     Wheezing:         Neurologic    Sudden weakness in arms or legs:     Sudden numbness in arms or legs:     Sudden onset of difficulty speaking or slurred speech:    Temporary loss of vision in one eye:     Problems with dizziness:         Gastrointestinal    Blood in stool:     Vomited blood:         Genitourinary    Burning when urinating:     Blood in urine:        Psychiatric    Major depression:         Hematologic    Bleeding problems:    Problems with blood clotting too easily:        Skin    Rashes or ulcers:        Constitutional    Fever or chills:      PHYSICAL EXAM: Vitals:   06/18/21 1450  BP: (!) 152/81  Pulse: 72  Resp: 14  Temp: 97.8 F (36.6 C)  TempSrc: Temporal  SpO2: 98%  Weight: 129 lb (58.5 kg)  Height: $Remove'5\' 5"'NwRyeAg$  (1.651 m)    GENERAL: The patient is a well-nourished female, in no acute distress. The  vital signs are documented above. CARDIAC: There is a regular rate and rhythm.  PULMONARY: no respiratory distress. ABDOMEN: Soft and non-tender. MUSCULOSKELETAL: There are no major deformities or cyanosis. NEUROLOGIC: No focal weakness or paresthesias are detected.  CN II-XII grossly intact SKIN: There are no ulcers or rashes noted. PSYCHIATRIC: The patient has a normal affect.  DATA:   Carotid duplex 06/11/21 shows 40-59% ICA stenosis bilaterally which is stable.   Assessment/Plan:  75 year old female presents for 63-month follow-up of her bilateral carotid artery stenosis.  Repeat duplex last week again demonstrates a stable 40 to 59% stenosis in bilateral ICAs over the past 6 months.  I brought her back at 6 months given an initial concern for moderate stenosis as noted above in the HPI when we first evaluated her. I discussed with her that current recommendations are medical management and reserve surgery for greater than 80% stenosis in asymptomatic disease.  We will increase her surveillance to 1 year given stable 40 to 59% stenosis bilaterally and asymptomatic disease.  Discussed she call with questions or concerns.    Marty Heck, MD [facility] [place] Office: [phone]

## 2021-07-17 DIAGNOSIS — M19041 Primary osteoarthritis, right hand: Secondary | ICD-10-CM | POA: Diagnosis not present

## 2021-07-17 DIAGNOSIS — Z853 Personal history of malignant neoplasm of breast: Secondary | ICD-10-CM | POA: Diagnosis not present

## 2021-07-17 DIAGNOSIS — Z87891 Personal history of nicotine dependence: Secondary | ICD-10-CM | POA: Diagnosis not present

## 2021-07-17 DIAGNOSIS — I10 Essential (primary) hypertension: Secondary | ICD-10-CM | POA: Diagnosis not present

## 2021-07-17 DIAGNOSIS — Z79899 Other long term (current) drug therapy: Secondary | ICD-10-CM | POA: Diagnosis not present

## 2021-07-17 DIAGNOSIS — H401123 Primary open-angle glaucoma, left eye, severe stage: Secondary | ICD-10-CM | POA: Diagnosis not present

## 2021-08-02 DIAGNOSIS — K591 Functional diarrhea: Secondary | ICD-10-CM | POA: Diagnosis not present

## 2021-08-02 DIAGNOSIS — R198 Other specified symptoms and signs involving the digestive system and abdomen: Secondary | ICD-10-CM | POA: Diagnosis not present

## 2021-08-02 DIAGNOSIS — Z86010 Personal history of colonic polyps: Secondary | ICD-10-CM | POA: Diagnosis not present

## 2021-08-02 DIAGNOSIS — R634 Abnormal weight loss: Secondary | ICD-10-CM | POA: Diagnosis not present

## 2021-08-06 DIAGNOSIS — K2980 Duodenitis without bleeding: Secondary | ICD-10-CM | POA: Diagnosis not present

## 2021-08-06 DIAGNOSIS — R634 Abnormal weight loss: Secondary | ICD-10-CM | POA: Diagnosis not present

## 2021-08-06 DIAGNOSIS — R197 Diarrhea, unspecified: Secondary | ICD-10-CM | POA: Diagnosis not present

## 2021-08-06 DIAGNOSIS — K3189 Other diseases of stomach and duodenum: Secondary | ICD-10-CM | POA: Diagnosis not present

## 2021-08-09 DIAGNOSIS — K2980 Duodenitis without bleeding: Secondary | ICD-10-CM | POA: Diagnosis not present

## 2021-09-09 DIAGNOSIS — Z1231 Encounter for screening mammogram for malignant neoplasm of breast: Secondary | ICD-10-CM | POA: Diagnosis not present

## 2021-09-12 ENCOUNTER — Ambulatory Visit: Payer: Medicare Other | Admitting: Obstetrics and Gynecology

## 2021-09-13 DIAGNOSIS — R634 Abnormal weight loss: Secondary | ICD-10-CM | POA: Diagnosis not present

## 2021-09-13 DIAGNOSIS — Z86010 Personal history of colonic polyps: Secondary | ICD-10-CM | POA: Diagnosis not present

## 2021-09-13 DIAGNOSIS — R197 Diarrhea, unspecified: Secondary | ICD-10-CM | POA: Diagnosis not present

## 2021-09-13 DIAGNOSIS — K2980 Duodenitis without bleeding: Secondary | ICD-10-CM | POA: Diagnosis not present

## 2021-09-14 DIAGNOSIS — Z23 Encounter for immunization: Secondary | ICD-10-CM | POA: Diagnosis not present

## 2021-09-17 ENCOUNTER — Encounter: Payer: Self-pay | Admitting: Obstetrics & Gynecology

## 2021-09-17 ENCOUNTER — Other Ambulatory Visit: Payer: Self-pay

## 2021-09-17 ENCOUNTER — Ambulatory Visit (INDEPENDENT_AMBULATORY_CARE_PROVIDER_SITE_OTHER): Payer: Medicare Other | Admitting: Obstetrics & Gynecology

## 2021-09-17 VITALS — BP 136/80 | HR 70 | Resp 16 | Ht 63.25 in | Wt 126.0 lb

## 2021-09-17 DIAGNOSIS — Z01419 Encounter for gynecological examination (general) (routine) without abnormal findings: Secondary | ICD-10-CM

## 2021-09-17 DIAGNOSIS — Z9189 Other specified personal risk factors, not elsewhere classified: Secondary | ICD-10-CM

## 2021-09-17 DIAGNOSIS — Z780 Asymptomatic menopausal state: Secondary | ICD-10-CM | POA: Diagnosis not present

## 2021-09-17 DIAGNOSIS — Z1501 Genetic susceptibility to malignant neoplasm of breast: Secondary | ICD-10-CM

## 2021-09-17 DIAGNOSIS — M8580 Other specified disorders of bone density and structure, unspecified site: Secondary | ICD-10-CM

## 2021-09-17 DIAGNOSIS — Z853 Personal history of malignant neoplasm of breast: Secondary | ICD-10-CM | POA: Diagnosis not present

## 2021-09-17 DIAGNOSIS — Z1509 Genetic susceptibility to other malignant neoplasm: Secondary | ICD-10-CM | POA: Diagnosis not present

## 2021-09-17 DIAGNOSIS — Z1589 Genetic susceptibility to other disease: Secondary | ICD-10-CM

## 2021-09-17 NOTE — Progress Notes (Addendum)
[name] 1946/11/05 [id number]   History:    75 y.o. G2P0A2  RP:  Established patient presenting for annual gyn exam   HPI: Postmenopausal.  No vaginal bleeding.  No significant menopausal symptoms.  Pap smear 2020.  No significant history of abnormal Pap smears.  BRCA2 pos.  Left Breast Ca s/p left mastectomy.  MRI bilateral breasts Neg 04/2021.  Prior BSO.  Pelvic US 09/2020 No ovary seen, no pelvic mass or cyst.  Colonoscopy 2022. Osteopenia.  DEXA 08/2018.  T score -1.2, FRAX 4.6% / 0.7%.  Health labs with Fam MD.   Past medical history,surgical history, family history and social history were all reviewed and documented in the EPIC chart.  Gynecologic History No LMP recorded. Patient is postmenopausal.  Obstetric History OB History  Gravida Para Term Preterm AB Living  2       2 0  SAB IAB Ectopic Multiple Live Births      2        # Outcome Date GA Lbr Len/2nd Weight Sex Delivery Anes PTL Lv  2 Ectopic           1 Ectopic              ROS: A ROS was performed and pertinent positives and negatives are included in the history.  GENERAL: No fevers or chills. HEENT: No change in vision, no earache, sore throat or sinus congestion. NECK: No pain or stiffness. CARDIOVASCULAR: No chest pain or pressure. No palpitations. PULMONARY: No shortness of breath, cough or wheeze. GASTROINTESTINAL: No abdominal pain, nausea, vomiting or diarrhea, melena or bright red blood per rectum. GENITOURINARY: No urinary frequency, urgency, hesitancy or dysuria. MUSCULOSKELETAL: No joint or muscle pain, no back pain, no recent trauma. DERMATOLOGIC: No rash, no itching, no lesions. ENDOCRINE: No polyuria, polydipsia, no heat or cold intolerance. No recent change in weight. HEMATOLOGICAL: No anemia or easy bruising or bleeding. NEUROLOGIC: No headache, seizures, numbness, tingling or weakness. PSYCHIATRIC: No depression, no loss of interest in normal activity or change in sleep pattern.      Exam:   BP 136/80   Pulse 70   Resp 16   Ht 5' 3.25" (1.607 m)   Wt 126 lb (57.2 kg)   BMI 22.14 kg/m   Body mass index is 22.14 kg/m.  General appearance : Well developed well nourished female. No acute distress HEENT: Eyes: no retinal hemorrhage or exudates,  Neck supple, trachea midline, no carotid bruits, no thyroidmegaly Lungs: Clear to auscultation, no rhonchi or wheezes, or rib retractions  Heart: Regular rate and rhythm, no murmurs or gallops Breast:Examined in sitting and supine position.  Lt mastectomy.  Rt breast: no palpable masses or tenderness,  no skin retraction, no nipple inversion, no nipple discharge, no skin discoloration, no axillary or supraclavicular lymphadenopathy Abdomen: no palpable masses or tenderness, no rebound or guarding Extremities: no edema or skin discoloration or tenderness  Pelvic: Vulva: Normal             Vagina: No gross lesions or discharge  Cervix: No gross lesions or discharge  Uterus  RV, normal size, shape and consistency, non-tender and mobile  Adnexa  Without masses or tenderness  Anus: Normal   Assessment/Plan:  75 y.o. female for annual exam   1. Well female exam with routine gynecological exam Postmenopausal.  No vaginal bleeding.  No significant menopausal symptoms.  Pap smear 2020.  No significant history of abnormal Pap smears.  BRCA2 pos.  Left Breast Ca s/p left mastectomy.  MRI bilateral breasts Neg 04/2021.  Prior BSO.  Pelvic US 09/2020 No ovary seen, no pelvic mass or cyst.  Colonoscopy 2022. Osteopenia.  DEXA 08/2018.  T score -1.2, FRAX 4.6% / 0.7%.  Health labs with Fam MD.   2. Postmenopause Postmenopausal.  No vaginal bleeding.  No significant menopausal symptoms.   - DG Bone Density; Future  3. Osteopenia, unspecified location Osteopenia.  DEXA 08/2018.  T score -1.2, FRAX 4.6% / 0.7%. Schedule repeat BD now. - DG Bone Density; Future  4. Personal history of malignant neoplasm of breast Lt breast Ca.  S/P  Lt Mastectomy.  5. BRCA2 positive MRI of Rt breast Neg, Lt Mastectomy.  S/P BSO.  Other orders - brimonidine (ALPHAGAN) 0.2 % ophthalmic solution; SMARTSIG:In Eye(s) - latanoprost (XALATAN) 0.005 % ophthalmic solution; SMARTSIG:In Eye(s) - losartan-hydrochlorothiazide (HYZAAR) 100-12.5 MG tablet; Take 1 tablet by mouth daily.   Princess Bruins MD, 3:55 PM 09/17/2021

## 2021-10-08 ENCOUNTER — Other Ambulatory Visit: Payer: Self-pay | Admitting: Obstetrics & Gynecology

## 2021-10-08 ENCOUNTER — Ambulatory Visit (INDEPENDENT_AMBULATORY_CARE_PROVIDER_SITE_OTHER): Payer: Medicare Other

## 2021-10-08 ENCOUNTER — Other Ambulatory Visit: Payer: Self-pay

## 2021-10-08 DIAGNOSIS — Z780 Asymptomatic menopausal state: Secondary | ICD-10-CM

## 2021-10-08 DIAGNOSIS — M8580 Other specified disorders of bone density and structure, unspecified site: Secondary | ICD-10-CM

## 2021-10-08 DIAGNOSIS — M85852 Other specified disorders of bone density and structure, left thigh: Secondary | ICD-10-CM

## 2021-10-21 DIAGNOSIS — N952 Postmenopausal atrophic vaginitis: Secondary | ICD-10-CM | POA: Diagnosis not present

## 2021-10-21 DIAGNOSIS — R300 Dysuria: Secondary | ICD-10-CM | POA: Diagnosis not present

## 2021-10-21 DIAGNOSIS — R3915 Urgency of urination: Secondary | ICD-10-CM | POA: Diagnosis not present

## 2021-11-01 ENCOUNTER — Ambulatory Visit: Payer: Medicare Other | Attending: Internal Medicine

## 2021-11-01 ENCOUNTER — Other Ambulatory Visit: Payer: Self-pay

## 2021-11-01 ENCOUNTER — Other Ambulatory Visit (HOSPITAL_BASED_OUTPATIENT_CLINIC_OR_DEPARTMENT_OTHER): Payer: Self-pay

## 2021-11-01 DIAGNOSIS — Z23 Encounter for immunization: Secondary | ICD-10-CM

## 2021-11-01 MED ORDER — PFIZER COVID-19 VAC BIVALENT 30 MCG/0.3ML IM SUSP
INTRAMUSCULAR | 0 refills | Status: DC
  Filled 2021-11-01: qty 0.3, 1d supply, fill #0

## 2021-11-01 NOTE — Progress Notes (Signed)
   Covid-19 Vaccination Clinic  Name:  [name]    MRN: [id number] DOB: March 22, 1946  11/01/2021  [name] was observed post Covid-19 immunization for 15 minutes without incident. She was provided with Vaccine Information Sheet and instruction to access the V-Safe system.   [name] was instructed to call 911 with any severe reactions post vaccine: Difficulty breathing  Swelling of face and throat  A fast heartbeat  A bad rash all over body  Dizziness and weakness   Immunizations Administered     Name Date Dose VIS Date Route   Pfizer Covid-19 Vaccine Bivalent Booster 11/01/2021  3:22 PM 0.3 mL 07/24/2021 Intramuscular   Manufacturer: [place]   Lot: [id number]   [place]: [phone]

## 2022-04-29 ENCOUNTER — Encounter: Payer: Self-pay | Admitting: Nurse Practitioner

## 2022-04-29 ENCOUNTER — Ambulatory Visit (INDEPENDENT_AMBULATORY_CARE_PROVIDER_SITE_OTHER): Payer: Medicare Other | Admitting: Nurse Practitioner

## 2022-04-29 VITALS — BP 122/78

## 2022-04-29 DIAGNOSIS — N760 Acute vaginitis: Secondary | ICD-10-CM

## 2022-04-29 DIAGNOSIS — R102 Pelvic and perineal pain: Secondary | ICD-10-CM | POA: Diagnosis not present

## 2022-04-29 DIAGNOSIS — N898 Other specified noninflammatory disorders of vagina: Secondary | ICD-10-CM | POA: Diagnosis not present

## 2022-04-29 LAB — URINALYSIS, COMPLETE W/RFL CULTURE
Bacteria, UA: NONE SEEN /HPF
Bilirubin Urine: NEGATIVE
Casts: NONE SEEN /LPF
Crystals: NONE SEEN /HPF
Glucose, UA: NEGATIVE
Hgb urine dipstick: NEGATIVE
Hyaline Cast: NONE SEEN /LPF
Ketones, ur: NEGATIVE
Leukocyte Esterase: NEGATIVE
Nitrites, Initial: NEGATIVE
Protein, ur: NEGATIVE
RBC / HPF: NONE SEEN /HPF (ref 0–2)
Specific Gravity, Urine: 1.025 (ref 1.001–1.035)
WBC, UA: NONE SEEN /HPF (ref 0–5)
Yeast: NONE SEEN /HPF
pH: 5.5 (ref 5.0–8.0)

## 2022-04-29 LAB — WET PREP FOR TRICH, YEAST, CLUE

## 2022-04-29 LAB — NO CULTURE INDICATED

## 2022-04-29 MED ORDER — TRIAMCINOLONE ACETONIDE 0.5 % EX CREA: TOPICAL_CREAM | Freq: Two times a day (BID) | CUTANEOUS | 0 refills | Status: DC

## 2022-04-29 NOTE — Progress Notes (Signed)
   Acute Office Visit  Subjective:    Patient ID: [name], female    DOB: 07-25-1946, 76 y.o.   MRN: [id number]   HPI 76 y.o. presents today for vulvar irritation, mild pelvic discomfort, and back pain. H/O bladder pain syndrome, sees urology. Took AZO with no improvement.    Review of Systems  Constitutional: Negative.   Gastrointestinal:  Positive for diarrhea (chronic). Negative for abdominal distention, constipation, nausea and vomiting.  Genitourinary:  Positive for dysuria, frequency, pelvic pain (Pressure/discomfort) and vaginal pain (Irritation). Negative for difficulty urinating, flank pain, hematuria, urgency and vaginal discharge.  Musculoskeletal:  Positive for back pain.      Objective:    Physical Exam Constitutional:      Appearance: Normal appearance.  Abdominal:     Tenderness: There is no right CVA tenderness or left CVA tenderness.  Genitourinary:    General: Normal vulva.     Vagina: Normal.     Cervix: Normal.     Comments: Atrophic changes   BP 122/78  Wt Readings from Last 3 Encounters:  09/17/21 126 lb (57.2 kg)  06/18/21 129 lb (58.5 kg)  05/24/21 127 lb 5 oz (57.7 kg)        Patient informed chaperone available to be present for breast and/or pelvic exam. Patient has requested no chaperone to be present. Patient has been advised what will be completed during breast and pelvic exam.   UA negative Wet prep negative  Assessment & Plan:   Problem List Items Addressed This Visit   None Visit Diagnoses     Acute vaginitis    -  Primary   Relevant Medications   triamcinolone cream (KENALOG) 0.5 %   Female pelvic pain       Relevant Orders   Urinalysis,Complete w/RFL Culture   Vaginal irritation       Relevant Orders   WET PREP FOR [place], YEAST, CLUE      Plan: Reassurance provided on normal wet prep, UA, and exam. Recommend applying thin layer of triamcinolone cream twice daily x 7 days, then use coconut oil daily for  moisturizing. H/O hormone positive breast cancer, vaginal estrogen last resort and she agrees. She plans to follow up with urology for management of bladder symptoms.      [place], 1:58 PM 04/29/2022

## 2022-04-30 ENCOUNTER — Telehealth: Payer: Self-pay | Admitting: Hematology and Oncology

## 2022-04-30 NOTE — Telephone Encounter (Signed)
Rescheduled appointment per provider PAL. Left message. 

## 2022-05-23 ENCOUNTER — Ambulatory Visit: Payer: Medicare Other | Admitting: Hematology and Oncology

## 2022-05-23 NOTE — Progress Notes (Signed)
Patient Care Team: Tisovec, [name], MD as PCP - General (Internal Medicine) Alicia Lose, MD as Consulting Physician (Hematology and Oncology) Alicia Lobo, MD as Consulting Physician (Gastroenterology) Bond, Alicia Harrier, MD as Referring Physician (Ophthalmology) Alicia Pierini, MD (Inactive) as Consulting Physician (Obstetrics and Gynecology)  DIAGNOSIS:  Encounter Diagnosis  Name Primary?   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 with above-mentioned history of left breast cancer treated with mastectomy, adjuvant chemotherapy, and anti-estrogen therapy for 7 years. MRI Breast on 05/16/21 shows no evidence of malignancy bilaterally. She reports to the clinic today for follow-up.  She denies any pain or discomfort in the breast other than when she drinks coffee.   ALLERGIES:  has No Known Allergies.  MEDICATIONS:  Current Outpatient Medications  Medication Sig Dispense Refill   atorvastatin (LIPITOR) 80 MG tablet Take 80 mg by mouth daily.     brimonidine (ALPHAGAN) 0.2 % ophthalmic solution SMARTSIG:In Eye(s)      Cholecalciferol (VITAMIN D) 1000 UNITS capsule Take 1,000 Units by mouth daily.     COVID-19 mRNA bivalent vaccine, Pfizer, (PFIZER COVID-19 VAC BIVALENT) injection Inject into the muscle. 0.3 mL 0   dorzolamide-timolol (COSOPT) 22.3-6.8 MG/ML ophthalmic solution Place 1 drop into both eyes 2 (two) times daily.     latanoprost (XALATAN) 0.005 % ophthalmic solution SMARTSIG:In Eye(s)     losartan-hydrochlorothiazide (HYZAAR) 100-12.5 MG tablet Take 1 tablet by mouth daily.     mirtazapine (REMERON) 7.5 MG tablet Take 7.5 mg by mouth at bedtime.     triamcinolone cream (KENALOG) 0.5 % Apply topically 2 (two) times daily. 30 g 0   No current facility-administered medications for this visit.    PHYSICAL EXAMINATION: ECOG PERFORMANCE STATUS: 1 - Symptomatic but completely ambulatory  Vitals:   06/06/22 0901  BP: (!) 158/81  Pulse: 77  Resp: 17  Temp: (!) 97.3 F (36.3 C)  SpO2: 100%   Filed Weights   06/06/22 0901  Weight: 130 lb (59 kg)    BREAST: No palpable masses or nodules in either right or left breasts. No palpable axillary supraclavicular or infraclavicular adenopathy no breast tenderness or nipple discharge. (exam performed in the presence of a chaperone)  LABORATORY DATA:  I have reviewed the data as listed    Latest Ref Rng & Units 05/18/2019    8:39 AM 10/26/2015   11:03 AM 10/25/2014   12:22 PM  CMP  Glucose 70 - 140 mg/dl  94  99   BUN 7.0 - 26.0 mg/dL  11.8  16.9   Creatinine 0.44 - 1.00 mg/dL 0.80  0.8  0.9   Sodium 136 -  145 mEq/L  143  143   Potassium 3.5 - 5.1 mEq/L  3.7  4.0   CO2 22 - 29 mEq/L  25  26   Calcium 8.4 - 10.4 mg/dL  9.4  10.1   Total Protein 6.4 - 8.3 g/dL  7.5  7.8   Total Bilirubin 0.20 - 1.20 mg/dL  0.81  0.53   Alkaline Phos 40 - 150 U/L  47  59   AST 5 - 34 U/L  20  22   ALT 0 - 55 U/L  16  20     Lab Results  Component Value Date   WBC 6.4 10/26/2015   HGB 12.2 10/26/2015   HCT 37.6 10/26/2015   MCV 91.2 10/26/2015   PLT 207  10/26/2015   NEUTROABS 3.3 10/26/2015    ASSESSMENT & PLAN:  Cancer of lower-inner quadrant of left female breast [name]) Multicentric left breast invasive ductal carcinoma status post mastectomy in October 2006 status post 6 cycles of FEC chemotherapy followed by antiestrogen therapy x8 years, currently on Evista for bone health as well as breast cancer prevention BRCA2 mutation positive   Breast Cancer Surveillance: Has FH of pancreatic cancer:   07/09/2020: MRI abdomen Benign cholelithiasis, we will plan to do MRI of the abdomen every other year. 05/16/2021: MRI breast: Benign breast density category C, I sent an order for another MRI of the breast for next year. 06/06/2022: Breast exam: Benign  10/11/2021: Mammogram at [facility]: Benign breast density category C  Bone density 10/11/2021: T score -1.2 Return to clinic in 1 year for follow-up    No orders of the defined types were placed in this encounter.  The patient has a good understanding of the overall plan. she agrees with it. she will call with any problems that may develop before the next visit here. Total time spent: 30 mins including face to face time and time spent for planning, charting and co-ordination of care   Alicia Ohara, MD 06/06/22    I [name] am scribing for [name]  I have reviewed the above documentation for accuracy and completeness, and I agree with the above.

## 2022-05-26 ENCOUNTER — Ambulatory Visit: Payer: Medicare Other | Admitting: Hematology and Oncology

## 2022-06-06 ENCOUNTER — Other Ambulatory Visit: Payer: Self-pay

## 2022-06-06 ENCOUNTER — Telehealth: Payer: Self-pay | Admitting: *Deleted

## 2022-06-06 ENCOUNTER — Inpatient Hospital Stay: Payer: Medicare Other | Attending: Hematology and Oncology | Admitting: Hematology and Oncology

## 2022-06-06 ENCOUNTER — Other Ambulatory Visit: Payer: Self-pay | Admitting: Hematology and Oncology

## 2022-06-06 ENCOUNTER — Other Ambulatory Visit: Payer: Self-pay | Admitting: *Deleted

## 2022-06-06 DIAGNOSIS — Z79899 Other long term (current) drug therapy: Secondary | ICD-10-CM | POA: Diagnosis not present

## 2022-06-06 DIAGNOSIS — C50312 Malignant neoplasm of lower-inner quadrant of left female breast: Secondary | ICD-10-CM | POA: Diagnosis not present

## 2022-06-06 DIAGNOSIS — Z853 Personal history of malignant neoplasm of breast: Secondary | ICD-10-CM | POA: Diagnosis present

## 2022-06-06 DIAGNOSIS — Z9012 Acquired absence of left breast and nipple: Secondary | ICD-10-CM | POA: Diagnosis not present

## 2022-06-06 DIAGNOSIS — Z1509 Genetic susceptibility to other malignant neoplasm: Secondary | ICD-10-CM

## 2022-06-06 DIAGNOSIS — Z170 Estrogen receptor positive status [ER+]: Secondary | ICD-10-CM

## 2022-06-06 DIAGNOSIS — Z9221 Personal history of antineoplastic chemotherapy: Secondary | ICD-10-CM | POA: Diagnosis not present

## 2022-06-06 NOTE — Assessment & Plan Note (Signed)
Multicentric left breast invasive ductal carcinoma status post mastectomy in October 2006 status post 6 cycles of FEC chemotherapy followed by antiestrogen therapy x8 years, currently on Evista for bone health as well as breast cancer prevention BRCA2 mutation positive  Breast Cancer Surveillance: Has FH of pancreatic cancer:   07/09/2020: MRI abdomen Benign cholelithiasis, we will plan to do MRI of the abdomen every other year. 05/16/2021: MRI breast: Benign breast density category C, I sent an order for another MRI of the breast for next year. 06/06/2022: Breast exam: Benign  10/11/2021: Mammogram at [facility]: Benign breast density category C  Bone density 10/11/2021: T score -1.2 Return to clinic in 1 year for follow-up

## 2022-06-06 NOTE — Telephone Encounter (Signed)
Per MD request, RN schedule pt breast MRI.  RN contacted pt and provided appt details.  Pt verbalized understanding.

## 2022-06-12 ENCOUNTER — Encounter (HOSPITAL_COMMUNITY): Payer: Self-pay | Admitting: Emergency Medicine

## 2022-06-12 ENCOUNTER — Other Ambulatory Visit: Payer: Self-pay

## 2022-06-12 ENCOUNTER — Emergency Department (HOSPITAL_COMMUNITY): Payer: Medicare Other

## 2022-06-12 ENCOUNTER — Emergency Department (HOSPITAL_COMMUNITY)
Admission: EM | Admit: 2022-06-12 | Discharge: 2022-06-13 | Disposition: A | Discharge status: Home / Self Care | Payer: Medicare Other | Attending: Emergency Medicine | Admitting: Emergency Medicine

## 2022-06-12 DIAGNOSIS — R079 Chest pain, unspecified: Secondary | ICD-10-CM | POA: Diagnosis not present

## 2022-06-12 DIAGNOSIS — R7989 Other specified abnormal findings of blood chemistry: Secondary | ICD-10-CM | POA: Diagnosis not present

## 2022-06-12 LAB — CBC WITH DIFFERENTIAL/PLATELET
Abs Immature Granulocytes: 0.02 10*3/uL (ref 0.00–0.07)
Basophils Absolute: 0 10*3/uL (ref 0.0–0.1)
Basophils Relative: 1 %
Eosinophils Absolute: 0.1 10*3/uL (ref 0.0–0.5)
Eosinophils Relative: 1 %
HCT: 32.3 % — ABNORMAL LOW (ref 36.0–46.0)
Hemoglobin: 10.8 g/dL — ABNORMAL LOW (ref 12.0–15.0)
Immature Granulocytes: 0 %
Lymphocytes Relative: 31 %
Lymphs Abs: 2 10*3/uL (ref 0.7–4.0)
MCH: 30.3 pg (ref 26.0–34.0)
MCHC: 33.4 g/dL (ref 30.0–36.0)
MCV: 90.5 fL (ref 80.0–100.0)
Monocytes Absolute: 0.7 10*3/uL (ref 0.1–1.0)
Monocytes Relative: 11 %
Neutro Abs: 3.5 10*3/uL (ref 1.7–7.7)
Neutrophils Relative %: 56 %
Platelets: 210 10*3/uL (ref 150–400)
RBC: 3.57 MIL/uL — ABNORMAL LOW (ref 3.87–5.11)
RDW: 15 % (ref 11.5–15.5)
WBC: 6.4 10*3/uL (ref 4.0–10.5)
nRBC: 0 % (ref 0.0–0.2)

## 2022-06-12 LAB — TROPONIN I (HIGH SENSITIVITY)
Troponin I (High Sensitivity): 4 ng/L (ref <18)
Troponin I (High Sensitivity): 4 ng/L (ref <18)

## 2022-06-12 LAB — BASIC METABOLIC PANEL
Anion gap: 9 (ref 5–15)
BUN: 19 mg/dL (ref 8–23)
CO2: 23 mmol/L (ref 22–32)
Calcium: 9.3 mg/dL (ref 8.9–10.3)
Chloride: 111 mmol/L (ref 98–111)
Creatinine, Ser: 1.2 mg/dL — ABNORMAL HIGH (ref 0.44–1.00)
GFR, Estimated: 47 mL/min — ABNORMAL LOW (ref >60)
Glucose, Bld: 86 mg/dL (ref 70–99)
Potassium: 3.4 mmol/L — ABNORMAL LOW (ref 3.5–5.1)
Sodium: 143 mmol/L (ref 135–145)

## 2022-06-12 NOTE — ED Provider Triage Note (Signed)
Emergency Medicine Provider Triage Evaluation Note  [name] , a 76 y.o. female  was evaluated in triage.  Pt complains of chest pain.  States she had a mild episode last night.  Had another episode this morning which was worse.  Currently without chest pain.  Was evaluated at PCP office and recommended to come to the emergency room for further evaluation.  States it felt similar to when she had pleurisy in the past.  Pain does not radiate.  Without associated shortness of breath, lightheadedness, palpitations, nausea.  Review of Systems  Positive: As above Negative: As above  Physical Exam  BP (!) 90/48 (BP Location: Left Arm)   Pulse 87   Temp 98.8 F (37.1 C) (Oral)   Resp 16   SpO2 95%  Gen:   Awake, no distress   Resp:  Normal effort  MSK:   Moves extremities without difficulty  Other:    Medical Decision Making  Medically screening exam initiated at 2:28 PM.  Appropriate orders placed.  [name] was informed that the remainder of the evaluation will be completed by another provider, this initial triage assessment does not replace that evaluation, and the importance of remaining in the ED until their evaluation is complete.     Evlyn Courier, PA-C 06/12/22 1429

## 2022-06-12 NOTE — ED Provider Notes (Signed)
[facility] EMERGENCY DEPARTMENT Provider Note   CSN: [id number] Arrival date & time: 06/12/22  1342     History  Chief Complaint  Patient presents with   Chest Pain    [name] is a 76 y.o. female.  76 year old female without significant past medical history who presents the ER today secondary to chest pain.  Patient states that she had an episode of sharp chest pain earlier today that was made better with a cold rag on her head.  No associated shortness of breath, radiation, nausea, vomiting, diaphoresis or lightheadedness.  No recent illnesses.  Patient states the pain was sharp and painful like when she had pleurisy in the past.   Chest Pain      Home Medications Prior to Admission medications   Medication Sig Start Date End Date Taking? Authorizing Provider  atorvastatin (LIPITOR) 80 MG tablet Take 80 mg by mouth daily.    [provider]  brimonidine (ALPHAGAN) 0.2 % ophthalmic solution SMARTSIG:In Eye(s) 07/20/21   [provider]  Cholecalciferol (VITAMIN D) 1000 UNITS capsule Take 1,000 Units by mouth daily.    [provider]  COVID-19 mRNA bivalent vaccine, Pfizer, (PFIZER COVID-19 VAC BIVALENT) injection Inject into the muscle. 11/01/21   Carlyle Basques, MD  dorzolamide-timolol (COSOPT) 22.3-6.8 MG/ML ophthalmic solution Place 1 drop into both eyes 2 (two) times daily.    [provider]  latanoprost (XALATAN) 0.005 % ophthalmic solution SMARTSIG:In Eye(s) 06/17/21   [provider]  losartan-hydrochlorothiazide (HYZAAR) 100-12.5 MG tablet Take 1 tablet by mouth daily. 06/17/21   [provider]  mirtazapine (REMERON) 7.5 MG tablet Take 7.5 mg by mouth at bedtime. 04/26/22   [provider]  triamcinolone cream (KENALOG) 0.5 % Apply topically 2 (two) times daily. 04/29/22   Tamela Gammon, NP      Allergies    Patient has no known allergies.    Review of Systems   Review of  Systems  Cardiovascular:  Positive for chest pain.    Physical Exam Updated Vital Signs BP (!) 141/76   Pulse 75   Temp 97.8 F (36.6 C) (Oral)   Resp 11   SpO2 100%  Physical Exam Vitals and nursing note reviewed.  Constitutional:      Appearance: She is well-developed.  HENT:     Head: Normocephalic and atraumatic.  Cardiovascular:     Rate and Rhythm: Normal rate and regular rhythm.  Pulmonary:     Effort: No respiratory distress.     Breath sounds: No stridor.  Chest:     Chest wall: Deformity (left mastectomy) present. No mass or tenderness.  Abdominal:     General: There is no distension.  Musculoskeletal:     Cervical back: Normal range of motion.     Right lower leg: No edema.     Left lower leg: No edema.  Skin:    General: Skin is warm and dry.  Neurological:     Mental Status: She is alert.     ED Results / Procedures / Treatments   Labs (all labs ordered are listed, but only abnormal results are displayed) Labs Reviewed  CBC WITH DIFFERENTIAL/PLATELET - Abnormal; Notable for the following components:      Result Value   RBC 3.57 (*)    Hemoglobin 10.8 (*)    HCT 32.3 (*)    All other components within normal limits  BASIC METABOLIC PANEL - Abnormal; Notable for the following components:  Potassium 3.4 (*)    Creatinine, Ser 1.20 (*)    GFR, Estimated 47 (*)    All other components within normal limits  TROPONIN I (HIGH SENSITIVITY)  TROPONIN I (HIGH SENSITIVITY)    EKG EKG Interpretation  Date/Time:  Thursday June 12 2022 13:56:08 EDT Ventricular Rate:  79 PR Interval:  136 QRS Duration: 78 QT Interval:  378 QTC Calculation: 433 R Axis:   10 Text Interpretation: Normal sinus rhythm Nonspecific T wave abnormality Abnormal ECG When compared with ECG of 05-Nov-2012 07:30, PREVIOUS ECG IS PRESENT  new T wave changes since 2013 Confirmed by Merrily Pew [phone]) on 06/12/2022 10:58:26 PM  Radiology DG Chest 2 View  Result Date:  06/12/2022 CLINICAL DATA:  Chest pain EXAM: CHEST - 2 VIEW COMPARISON:  08/14/2011 FINDINGS: The heart size and mediastinal contours are within normal limits. Both lungs are clear. The visualized skeletal structures are unremarkable. IMPRESSION: No active cardiopulmonary disease. Electronically Signed   By: Randa Ngo M.D.   On: 06/12/2022 15:01    Procedures Procedures    Medications Ordered in ED Medications - No data to display  ED Course/ Medical Decision Making/ A&P                           Medical Decision Making  ECG without any evidence of acute coronary syndrome does have some new inverted T waves compared to many years ago.  Unclear on significance of this although her troponins are negative and the story is very atypical for ACS.  Doubt PE without any swelling, shortness of breath, tachycardia or other risk factors.  No evidence of pneumonia, pleural effusion or other abnormalities on her x-ray (viewed interpreted by myself and agree with radiology read).  She 1 blood pressure 90/48 when she got here but I read the blood pressure without intervention have been normal.  Not clear on why that would have been like that and that she is mildly dehydrated.  She is been drinking water since has been here making urine no indication for IV fluids even though her creatinine is up a little bit.  She will follow this up with her PCP.  Husband thinks this may be related to stress however with his fleeting nature and the sharpness of it and only seems more musculoskeletal in nature.    Return here for any new or worsening symptoms.  Final Clinical Impression(s) / ED Diagnoses Final diagnoses:  Nonspecific chest pain  Elevated serum creatinine    Rx / DC Orders ED Discharge Orders     None         Gotti Alwin, Corene Cornea, MD 06/12/22 [phone]

## 2022-06-12 NOTE — ED Triage Notes (Signed)
Pt states she started having some cp last night. Pt states the pain came again this morning. Denies n/v. Pt did feel dizzy at the time. States the pain has gone away. Pt went to her primary MD and was sent here to be evaluated.

## 2022-06-12 NOTE — ED Notes (Signed)
ED Provider at bedside. 

## 2022-06-18 ENCOUNTER — Ambulatory Visit
Admission: RE | Admit: 2022-06-18 | Discharge: 2022-06-18 | Disposition: A | Discharge status: Home / Self Care | Payer: Medicare Other | Source: Ambulatory Visit | Attending: Hematology and Oncology | Admitting: Hematology and Oncology

## 2022-06-18 DIAGNOSIS — Z1501 Genetic susceptibility to malignant neoplasm of breast: Secondary | ICD-10-CM

## 2022-06-18 MED ORDER — GADOBUTROL 1 MMOL/ML IV SOLN
6.0000 mL | Freq: Once | INTRAVENOUS | Status: AC | PRN
  Administered 2022-06-18: 6 mL via INTRAVENOUS

## 2022-11-06 ENCOUNTER — Other Ambulatory Visit: Payer: Self-pay | Admitting: *Deleted

## 2022-11-06 DIAGNOSIS — I6523 Occlusion and stenosis of bilateral carotid arteries: Secondary | ICD-10-CM

## 2022-11-13 NOTE — Progress Notes (Signed)
HISTORY AND PHYSICAL     CC:  follow up. Requesting Provider:  Haywood Pao, MD  HPI: This is a 76 y.o. female here for follow up for carotid artery stenosis.  She was seen by Dr. Carlis Abbott in 2021 for carotid artery stenosis after duplex.  She was asymptomatic.  She had moderate ICA stenosis bilaterally and has been followed with vascular surgery since then.      Pt was last seen 06/18/2021 and at that time she did not have any neurological sx.  She did have some blurriness in the left eye from her glaucoma but she felt this was at baseline.  She had 40-59% bilateral ICA stenosis and was scheduled for one year follow up.  Pt returns today for follow up.    Pt denies any amaurosis fugax, speech difficulties, weakness, numbness, paralysis or clumsiness or facial droop.   She states that she does have glaucoma but this is stable.   She states that she had tried asa but it upset her stomach.  She was going to try to take it again maybe a couple of times a week or even after eating.    The pt is on a statin for cholesterol management.  The pt is not on a daily aspirin.   Other AC:  none The pt is on ARB, diuretic for hypertension.   The pt does not have diabetes Tobacco hx:  former  Pt does  have family hx of AAA with her grandmother.  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 Right tube and ovary never identified   BREAST SURGERY  08/29/2005   TOTAL LEFT MASTECTOMY W/ NODE DISSECTION X4   CARPAL TUNNEL RELEASE  2007    RIGHT   CATARACT EXTRACTION     Right eye   CATARACT EXTRACTION Left 07/2020   DILATION AND CURETTAGE OF UTERUS  1968   dx laparoscopy  07/31/2011   resulted into open salpingo-opphorectomy    ECTOPIC PREGNANCY SURGERY  73 & 74   EYE SURGERY  2001   RIGHT EYE FOR GLAUCOMA   HAND SURGERY     HYSTEROSCOPY WITH D & C  11/05/2012   Procedure: DILATATION AND CURETTAGE /HYSTEROSCOPY;  Surgeon: Bennetta Laos, MD;  Location: [facility];  Service: Gynecology;  Laterality: N/A;   left eye surgery  2022   PORTA CATH     TRANSTHORACIC ECHOCARDIOGRAM  10/14/2005   LVSF NORMAL/ EF 55-65%/ MILD MITRAL REGURG    No Known Allergies  Current Outpatient Medications  Medication Sig Dispense Refill   atorvastatin (LIPITOR) 80 MG tablet Take 80 mg by mouth daily.     brimonidine (ALPHAGAN) 0.2 % ophthalmic solution SMARTSIG:In Eye(s)     Cholecalciferol (VITAMIN D) 1000 UNITS capsule Take 1,000 Units by mouth daily.     COVID-19 mRNA bivalent vaccine, Pfizer, (PFIZER COVID-19 VAC BIVALENT) injection Inject into the muscle. 0.3 mL 0  dorzolamide-timolol (COSOPT) 22.3-6.8 MG/ML ophthalmic solution Place 1 drop into both eyes 2 (two) times daily.     latanoprost (XALATAN) 0.005 % ophthalmic solution SMARTSIG:In Eye(s)     losartan-hydrochlorothiazide (HYZAAR) 100-12.5 MG tablet Take 1 tablet by mouth daily.     mirtazapine (REMERON) 7.5 MG tablet Take 7.5 mg by mouth at bedtime.     triamcinolone cream (KENALOG) 0.5 % Apply topically 2 (two) times daily. 30 g 0   No current facility-administered medications for this visit.    Family History  Problem Relation Age of Onset   Hypertension Mother    Cancer Father        COLON   Breast cancer Sister        Age 52's   Diabetes Sister    Stroke Sister    Breast cancer Sister        Age 50's   Stroke Sister    Breast cancer Sister        Age 41's   Breast cancer Paternal 69        Age unknown   Cancer Paternal  Grandfather        Unknown type    Social History   Socioeconomic History   Marital status: Married    Spouse name: Not on file   Number of children: Not on file   Years of education: Not on file   Highest education level: Not on file  Occupational History   Not on file  Tobacco Use   Smoking status: Former   Smokeless tobacco: Never   Tobacco comments:    smoking for a few yrs >5  , quit many yrs ago  Vaping Use   Vaping Use: Never used  Substance and Sexual Activity   Alcohol use: No    Alcohol/week: 0.0 standard drinks of alcohol   Drug use: No   Sexual activity: Yes    Partners: Male    Birth control/protection: Post-menopausal    Comment: 1st intercourse 1 yo-1 partner  Other Topics Concern   Not on file  Social History Narrative   Not on file   Social Determinants of Health   Financial Resource Strain: Not on file  Food Insecurity: Not on file  Transportation Needs: Not on file  Physical Activity: Not on file  Stress: Not on file  Social Connections: Not on file  Intimate Partner Violence: Not on file     REVIEW OF SYSTEMS:   _0  denotes positive finding, _1  denotes negative finding Cardiac  Comments:  Chest pain or chest pressure:    Shortness of breath upon exertion:    Short of breath when lying flat:    Irregular heart rhythm:        Vascular    Pain in calf, thigh, or hip brought on by ambulation:    Pain in feet at night that wakes you up from your sleep:     Blood clot in your veins:    Leg swelling:         Pulmonary    Oxygen at home:    Productive cough:     Wheezing:         Neurologic    Sudden weakness in arms or legs:     Sudden numbness in arms or legs:     Sudden onset of difficulty speaking or slurred speech:    Temporary loss of vision in one eye:     Problems with dizziness:  Gastrointestinal    Blood in stool:     Vomited blood:         Genitourinary    Burning when urinating:     Blood in urine:         Psychiatric    Major depression:         Hematologic    Bleeding problems:    Problems with blood clotting too easily:        Skin    Rashes or ulcers:        Constitutional    Fever or chills:      PHYSICAL EXAMINATION:  Today's Vitals   11/18/22 1301  BP: 137/80  Pulse: 73  Resp: 14  Temp: (!) 97.3 F (36.3 C)  TempSrc: Temporal  SpO2: 99%  Weight: 130 lb (59 kg)  Height: _0  (1.651 m)   Body mass index is 21.63 kg/m.   General:  WDWN in NAD; vital signs documented above Gait: Not observed HENT: WNL, normocephalic Pulmonary: normal non-labored breathing Cardiac: regular HR, without carotid bruits Abdomen: soft, NT; aortic pulse is not palpable Skin: without rashes Vascular Exam/Pulses:  Right Left  Radial 2+ (normal) 2+ (normal)  DP 2+ (normal) 2+ (normal)   Extremities: without open wounds Musculoskeletal: no muscle wasting or atrophy  Neurologic: A&O X 3; moving all extremities equally; speech is fluent/normal Psychiatric:  The pt has Normal affect.   Non-Invasive Vascular Imaging:   Carotid Duplex on 11/18/2022 Right:  40-59% ICA stenosis Left:  1-39% ICA stenosis   Previous Carotid duplex on 06/11/2021: Right: 40-59% ICA stenosis Left:   40-59% ICA stenosis    ASSESSMENT/PLAN:: 76 y.o. female here for follow up for bilateral carotid artery stenosis   -duplex today reveals carotid artery stenosis is stable and she remains asymptomatic.   -discussed s/s of stroke with pt and she understands should she develop any of these sx, she will go to the nearest ER or call 911. -pt will f/u in one year with carotid duplex -pt will call sooner should she have any issues. -she does have family hx of AAA with her grandmother.  She had an MRI with and without contrast in 2021.  Will get Medicare screen aorta u/s when she comes in next year with her carotid study.  She expressed understanding -continue statin/asa    Leontine Locket, [facility] [phone]  Clinic MD:  Stanford Breed

## 2022-11-18 ENCOUNTER — Ambulatory Visit (HOSPITAL_COMMUNITY)
Admission: RE | Admit: 2022-11-18 | Discharge: 2022-11-18 | Disposition: A | Discharge status: Home / Self Care | Payer: Medicare Other | Source: Ambulatory Visit | Attending: Vascular Surgery | Admitting: Vascular Surgery

## 2022-11-18 ENCOUNTER — Ambulatory Visit (INDEPENDENT_AMBULATORY_CARE_PROVIDER_SITE_OTHER): Payer: Medicare Other | Admitting: Physician Assistant

## 2022-11-18 VITALS — BP 137/80 | HR 73 | Temp 97.3°F | Resp 14 | Ht 65.0 in | Wt 130.0 lb

## 2022-11-18 DIAGNOSIS — I6523 Occlusion and stenosis of bilateral carotid arteries: Secondary | ICD-10-CM

## 2022-11-18 DIAGNOSIS — Z8249 Family history of ischemic heart disease and other diseases of the circulatory system: Secondary | ICD-10-CM | POA: Diagnosis not present

## 2023-04-10 ENCOUNTER — Other Ambulatory Visit: Payer: Medicare Other

## 2023-04-10 ENCOUNTER — Telehealth: Payer: Self-pay

## 2023-04-10 DIAGNOSIS — N760 Acute vaginitis: Secondary | ICD-10-CM

## 2023-04-10 DIAGNOSIS — R350 Frequency of micturition: Secondary | ICD-10-CM

## 2023-04-10 DIAGNOSIS — R300 Dysuria: Secondary | ICD-10-CM

## 2023-04-10 LAB — URINALYSIS, COMPLETE W/RFL CULTURE
Bacteria, UA: NONE SEEN /HPF
Bilirubin Urine: NEGATIVE
Glucose, UA: NEGATIVE
Hgb urine dipstick: NEGATIVE
Hyaline Cast: NONE SEEN /LPF
Ketones, ur: NEGATIVE
Leukocyte Esterase: NEGATIVE
Nitrites, Initial: NEGATIVE
Protein, ur: NEGATIVE
RBC / HPF: NONE SEEN /HPF (ref 0–2)
Specific Gravity, Urine: 1.015 (ref 1.001–1.035)
WBC, UA: NONE SEEN /HPF (ref 0–5)
pH: 6 (ref 5.0–8.0)

## 2023-04-10 LAB — NO CULTURE INDICATED

## 2023-04-10 MED ORDER — FLUCONAZOLE 150 MG PO TABS: ORAL_TABLET | ORAL | 0 refills | Status: DC

## 2023-04-10 NOTE — Telephone Encounter (Signed)
Per ML: "Please send Fluconazole 150 mg 1 tab PO every other day x 3.  Drop a urine specimen here for U/A, reflex U. Culture.  Alternatively, can go to an Urgent Care. Dr. Elbert Ewings"   Pt notified and voiced understanding. Rx sent and lab appt made for 140pm today. Will close encounter.

## 2023-04-10 NOTE — Telephone Encounter (Signed)
Pt calling to report vulvar/vaginal itching/irritation. Pt reports using 3 day OTC monistat for relief w/o success. Pt also reports urinary frequency and slight dysuria. Denies any discharge/odor. States she has been given metrogel in the past that has helped with her sxs and is inquiring if can receive again. States appt desk reports next available appt for OV on Weds 5/22.  Last AEX 09/17/2021--recall sent for 2023, nothing currently scheduled.   Please advise.

## 2023-04-14 ENCOUNTER — Encounter: Payer: Self-pay | Admitting: Obstetrics & Gynecology

## 2023-04-14 ENCOUNTER — Ambulatory Visit (INDEPENDENT_AMBULATORY_CARE_PROVIDER_SITE_OTHER): Payer: Medicare Other | Admitting: Obstetrics & Gynecology

## 2023-04-14 VITALS — BP 106/70 | HR 77

## 2023-04-14 DIAGNOSIS — N9089 Other specified noninflammatory disorders of vulva and perineum: Secondary | ICD-10-CM

## 2023-04-14 DIAGNOSIS — R350 Frequency of micturition: Secondary | ICD-10-CM | POA: Diagnosis not present

## 2023-04-14 LAB — WET PREP FOR TRICH, YEAST, CLUE

## 2023-04-14 LAB — URINALYSIS, COMPLETE W/RFL CULTURE
Bacteria, UA: NONE SEEN /HPF
Bilirubin Urine: NEGATIVE
Glucose, UA: NEGATIVE
Hgb urine dipstick: NEGATIVE
Hyaline Cast: NONE SEEN /LPF
Leukocyte Esterase: NEGATIVE
Nitrites, Initial: NEGATIVE
Protein, ur: NEGATIVE
RBC / HPF: NONE SEEN /HPF (ref 0–2)
Specific Gravity, Urine: 1.015 (ref 1.001–1.035)
WBC, UA: NONE SEEN /HPF (ref 0–5)
pH: 7 (ref 5.0–8.0)

## 2023-04-14 LAB — NO CULTURE INDICATED

## 2023-04-14 MED ORDER — NYSTATIN-TRIAMCINOLONE 100000-0.1 UNIT/GM-% EX CREA: 1.0000 | TOPICAL_CREAM | Freq: Two times a day (BID) | CUTANEOUS | 0 refills | Status: AC

## 2023-04-14 NOTE — Progress Notes (Signed)
    [name] 79/25/1947 [id number]        77 y.o.  G2P0020   RP: Urinary frequency and vulvar irritation x 1 week  HPI: Patient c/o vulvar itching, irritation, vaginal discharge.  Not improved with Monistat.  Urinary frequency, lower back pain.  No fever.  No PMB.   OB History  Gravida Para Term Preterm AB Living  2       2 0  SAB IAB Ectopic Multiple Live Births      2        # Outcome Date GA Lbr Len/2nd Weight Sex Delivery Anes PTL Lv  2 Ectopic           1 Ectopic             Past medical history,surgical history, problem list, medications, allergies, family history and social history were all reviewed and documented in the EPIC chart.   Directed ROS with pertinent positives and negatives documented in the history of present illness/assessment and plan.  Exam:  Vitals:   04/14/23 1333  BP: 106/70  Pulse: 77  SpO2: 97%   General appearance:  Normal   Gynecologic exam: Vulva normal.  Speculum:  Cervix/Vagina normal.  Mild discharge.  Wet prep done.  Bimanual exam:  Uterus AV, normal volume, mobile, NT.  No adnexal mass, NT bilaterally.  Wet prep: Negative U/A Negative, except trace ketones   Assessment/Plan:  77 y.o. G2P0020   1. Urinary frequency Urinary frequency, lower back pain.  No fever.  U/A Neg, except trace ketones.  Push water intake.  Reassured. - Urinalysis,Complete w/RFL Culture  2. Vulvar irritation Patient c/o vulvar itching, irritation, vaginal discharge.  Not improved with Monistat.  Wet prep Neg.  Will try Mycolog cream for vulvar irritation.  Prescription sent to pharmacy. - WET PREP FOR TRICH, YEAST, CLUE  Other orders - nystatin-triamcinolone (MYCOLOG II) cream; Apply 1 Application topically 2 (two) times daily for 14 days.   Genia Del MD, 1:51 PM 04/14/2023

## 2023-06-06 NOTE — Progress Notes (Signed)
Patient Care Team: Tisovec, Adelfa Koh, MD as PCP - General (Internal Medicine) Serena Croissant, MD as Consulting Physician (Hematology and Oncology) Bernette Redbird, MD as Consulting Physician (Gastroenterology) Bond, Doran Stabler, MD as Referring Physician (Ophthalmology)  DIAGNOSIS:  Encounter Diagnosis  Name Primary?   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 with above-mentioned history of left breast cancer. She presents to the clinic for a follow-up. Pt reports she is doing well. She denies any pain or discomfort in breast.  She has not been exercising a whole lot.  She has very sensitive skin and gets sensitize easily to sunlight.  She would like [place] to write a prescription to get her car window stented.    ALLERGIES:  has No Known Allergies.  MEDICATIONS:  Current Outpatient Medications  Medication Sig Dispense Refill   atorvastatin (LIPITOR) 80 MG tablet Take 80 mg by mouth daily.     brimonidine (ALPHAGAN) 0.2 % ophthalmic solution SMARTSIG:In Eye(s)     Cholecalciferol (VITAMIN D) 1000 UNITS capsule Take 1,000 Units by  mouth daily.     dorzolamide-timolol (COSOPT) 22.3-6.8 MG/ML ophthalmic solution Place 1 drop into both eyes 2 (two) times daily.     latanoprost (XALATAN) 0.005 % ophthalmic solution SMARTSIG:In Eye(s)     losartan-hydrochlorothiazide (HYZAAR) 100-12.5 MG tablet Take 1 tablet by mouth daily.     fluconazole (DIFLUCAN) 150 MG tablet Take on tab po every other day x 3 doses 3 tablet 0   No current facility-administered medications for this visit.    PHYSICAL EXAMINATION: ECOG PERFORMANCE STATUS: 0 - Asymptomatic  Vitals:   06/09/23 0852  BP: 132/76  Pulse: 69  Resp: 18  Temp: (!) 97.3 F (36.3 C)  SpO2: 100%   Filed Weights   06/09/23 0852  Weight: 127 lb 3.2 oz (57.7 kg)    BREAST: No palpable masses or nodules in either right breast or left chest wall. No palpable axillary supraclavicular or infraclavicular adenopathy no breast tenderness or nipple discharge. (exam performed in the presence of a chaperone)  LABORATORY DATA:  I have reviewed the data as listed    Latest Ref Rng & Units 06/12/2022    2:04 PM 05/18/2019    8:39 AM 10/26/2015   11:03 AM  CMP  Glucose 70 - 99 mg/dL 86   94   BUN 8 - 23 mg/dL 19   78.2   Creatinine 0.44 - 1.00 mg/dL 9.56  2.13  0.8   Sodium 135 - 145 mmol/L 143   143   Potassium 3.5 - 5.1 mmol/L 3.4   3.7   Chloride 98 - 111 mmol/L 111  CO2 22 - 32 mmol/L 23   25   Calcium 8.9 - 10.3 mg/dL 9.3   9.4   Total Protein 6.4 - 8.3 g/dL   7.5   Total Bilirubin 0.20 - 1.20 mg/dL   1.61   Alkaline Phos 40 - 150 U/L   47   AST 5 - 34 U/L   20   ALT 0 - 55 U/L   16     Lab Results  Component Value Date   WBC 6.4 06/12/2022   HGB 10.8 (L) 06/12/2022   HCT 32.3 (L) 06/12/2022   MCV 90.5 06/12/2022   PLT 210 06/12/2022   NEUTROABS 3.5 06/12/2022    ASSESSMENT & PLAN:  Cancer of lower-inner quadrant of left female breast [facility]) Multicentric left breast invasive ductal carcinoma status post mastectomy in October 2006 status post 6 cycles of  FEC chemotherapy followed by antiestrogen therapy x8 years, currently on Evista for bone health as well as breast cancer prevention BRCA2 mutation positive   Breast Cancer Surveillance: Has FH of pancreatic cancer:   07/09/2020: MRI abdomen Benign cholelithiasis, we will order MRI to be done November 2024 06/19/2022: MRI breast: Benign breast density category C.  Annual breast MRIs ordered 06/09/2023: Breast exam: Benign  09/10/2022: M[facility] at [facility]: Benign breast density category C   Bone density 10/11/2021: T score -1.2 Return to clinic in 1 year for follow-up    Orders Placed This Encounter  Procedures   MR BREAST BILATERAL W WO CONTRAST INC CAD    Standing Status:   Future    Standing Expiration Date:   06/08/2024    Order Specific Question:   If indicated for the ordered procedure, I authorize the administration of contrast media per Radiology protocol    Answer:   Yes    Order Specific Question:   What is the patient's sedation requirement?    Answer:   No Sedation    Order Specific Question:   Does the patient have a pacemaker or implanted devices?    Answer:   No    Order Specific Question:   Preferred imaging location?    Answer:   [facility] (table limit-550lbs)   MR Abdomen W Wo Contrast    Standing Status:   Future    Standing Expiration Date:   06/08/2024    Order Specific Question:   If indicated for the ordered procedure, I authorize the administration of contrast media per Radiology protocol    Answer:   Yes    Order Specific Question:   What is the patient's sedation requirement?    Answer:   No Sedation    Order Specific Question:   Does the patient have a pacemaker or implanted devices?    Answer:   No    Order Specific Question:   Preferred imaging location?    Answer:   [facility] (table limit-550lbs)   The patient has a good understanding of the overall plan. she agrees with it. she will call with any problems that may develop before the  next visit here. Total time spent: 30 mins including face to face time and time spent for planning, charting and co-ordination of care   Tamsen Meek, MD 06/09/23    sacroiliac

## 2023-06-09 ENCOUNTER — Encounter: Payer: Self-pay | Admitting: Internal Medicine

## 2023-06-09 ENCOUNTER — Inpatient Hospital Stay: Payer: Medicare Other | Attending: Hematology and Oncology | Admitting: Hematology and Oncology

## 2023-06-09 VITALS — BP 132/76 | HR 69 | Temp 97.3°F | Resp 18 | Ht 65.0 in | Wt 127.2 lb

## 2023-06-09 DIAGNOSIS — C50312 Malignant neoplasm of lower-inner quadrant of left female breast: Secondary | ICD-10-CM

## 2023-06-09 DIAGNOSIS — Z170 Estrogen receptor positive status [ER+]: Secondary | ICD-10-CM | POA: Diagnosis not present

## 2023-06-09 DIAGNOSIS — Z79899 Other long term (current) drug therapy: Secondary | ICD-10-CM | POA: Insufficient documentation

## 2023-06-09 DIAGNOSIS — Z853 Personal history of malignant neoplasm of breast: Secondary | ICD-10-CM | POA: Insufficient documentation

## 2023-06-09 NOTE — Assessment & Plan Note (Signed)
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 06/19/2022: MRI breast: Benign breast density category C.  Annual breast MRIs recommended 06/09/2023: Breast exam: Benign  10/11/2021: Mammogram at [facility]: Benign breast density category C   Bone density 10/11/2021: T score -1.2 Return to clinic in 1 year for follow-up

## 2023-07-10 ENCOUNTER — Ambulatory Visit
Admission: RE | Admit: 2023-07-10 | Discharge: 2023-07-10 | Disposition: A | Discharge status: Home / Self Care | Payer: Medicare Other | Source: Ambulatory Visit | Attending: Hematology and Oncology | Admitting: Hematology and Oncology

## 2023-07-10 DIAGNOSIS — Z170 Estrogen receptor positive status [ER+]: Secondary | ICD-10-CM

## 2023-07-10 MED ORDER — GADOPICLENOL 0.5 MMOL/ML IV SOLN
6.0000 mL | Freq: Once | INTRAVENOUS | Status: AC | PRN
  Administered 2023-07-10: 6 mL via INTRAVENOUS

## 2023-07-29 ENCOUNTER — Emergency Department (HOSPITAL_BASED_OUTPATIENT_CLINIC_OR_DEPARTMENT_OTHER)
Admission: EM | Admit: 2023-07-29 | Discharge: 2023-07-29 | Disposition: A | Discharge status: Home / Self Care | Payer: Medicare Other | Attending: Emergency Medicine | Admitting: Emergency Medicine

## 2023-07-29 ENCOUNTER — Other Ambulatory Visit: Payer: Self-pay

## 2023-07-29 DIAGNOSIS — R55 Syncope and collapse: Secondary | ICD-10-CM | POA: Insufficient documentation

## 2023-07-29 DIAGNOSIS — R031 Nonspecific low blood-pressure reading: Secondary | ICD-10-CM | POA: Insufficient documentation

## 2023-07-29 DIAGNOSIS — N1831 Chronic kidney disease, stage 3a: Secondary | ICD-10-CM | POA: Diagnosis not present

## 2023-07-29 DIAGNOSIS — T50905A Adverse effect of unspecified drugs, medicaments and biological substances, initial encounter: Secondary | ICD-10-CM | POA: Insufficient documentation

## 2023-07-29 LAB — BASIC METABOLIC PANEL
Anion gap: 8 (ref 5–15)
BUN: 24 mg/dL — ABNORMAL HIGH (ref 8–23)
CO2: 26 mmol/L (ref 22–32)
Calcium: 9.1 mg/dL (ref 8.9–10.3)
Chloride: 105 mmol/L (ref 98–111)
Creatinine, Ser: 1.23 mg/dL — ABNORMAL HIGH (ref 0.44–1.00)
GFR, Estimated: 45 mL/min — ABNORMAL LOW (ref >60)
Glucose, Bld: 94 mg/dL (ref 70–99)
Potassium: 3.8 mmol/L (ref 3.5–5.1)
Sodium: 139 mmol/L (ref 135–145)

## 2023-07-29 LAB — CBC
HCT: 32.5 % — ABNORMAL LOW (ref 36.0–46.0)
Hemoglobin: 10.8 g/dL — ABNORMAL LOW (ref 12.0–15.0)
MCH: 29.9 pg (ref 26.0–34.0)
MCHC: 33.2 g/dL (ref 30.0–36.0)
MCV: 90 fL (ref 80.0–100.0)
Platelets: 196 10*3/uL (ref 150–400)
RBC: 3.61 MIL/uL — ABNORMAL LOW (ref 3.87–5.11)
RDW: 14.8 % (ref 11.5–15.5)
WBC: 7.1 10*3/uL (ref 4.0–10.5)
nRBC: 0 % (ref 0.0–0.2)

## 2023-07-29 NOTE — ED Provider Notes (Signed)
[place] EMERGENCY DEPARTMENT AT [facility] Provider Note   CSN: [id number] Arrival date & time: 07/29/23  1817     History  Chief Complaint  Patient presents with   Medication Reaction    [name] is a 77 y.o. female.  Pt c/o her blood pressure being low on her home machine earlier. States had use her normal eye drops prior (alphagan). Denies change in meds or new meds. Indicates felt mildly lightheaded earlier. No associated pain. No headache. No chest pain or discomfort. No sob or unusual doe. No abd pain or nvd. No dysuria. No recent blood loss, rectal bleeding or melena. No fever or chills.   The history is provided by the patient and medical records.       Home Medications Prior to Admission medications   Medication Sig Start Date End Date Taking? Authorizing Provider  atorvastatin (LIPITOR) 80 MG tablet Take 80 mg by mouth daily.    [provider]  brimonidine (ALPHAGAN) 0.2 % ophthalmic solution SMARTSIG:In Eye(s) 07/20/21   [provider]  Cholecalciferol (VITAMIN D) 1000 UNITS capsule Take 1,000 Units by mouth daily.    [provider]  dorzolamide-timolol (COSOPT) 22.3-6.8 MG/ML ophthalmic solution Place 1 drop into both eyes 2 (two) times daily.    [provider]  fluconazole (DIFLUCAN) 150 MG tablet Take on tab po every other day x 3 doses 04/10/23   Genia Del, MD  latanoprost (XALATAN) 0.005 % ophthalmic solution SMARTSIG:In Eye(s) 06/17/21   [provider]  losartan-hydrochlorothiazide (HYZAAR) 100-12.5 MG tablet Take 1 tablet by mouth daily. 06/17/21   [provider]      Allergies    Patient has no known allergies.    Review of Systems   Review of Systems  Constitutional:  Negative for chills and fever.  Eyes:  Negative for pain.  Respiratory:  Negative for cough and shortness of breath.   Cardiovascular:  Negative for chest pain, palpitations and leg swelling.   Gastrointestinal:  Negative for abdominal pain, blood in stool, diarrhea and vomiting.  Genitourinary:  Negative for dysuria.  Musculoskeletal:  Negative for back pain.  Skin:  Negative for rash.  Neurological:  Negative for syncope and headaches.  Hematological:  Does not bruise/bleed easily.  Psychiatric/Behavioral:  Negative for confusion.     Physical Exam Updated Vital Signs BP 132/89 (BP Location: Right Arm)   Pulse 85   Temp 98.6 F (37 C)   Resp 19   SpO2 99%  Physical Exam Vitals and nursing note reviewed.  Constitutional:      Appearance: Normal appearance. She is well-developed.  HENT:     Head: Atraumatic.     Nose: Nose normal.     Mouth/Throat:     Mouth: Mucous membranes are moist.  Eyes:     General: No scleral icterus.    Conjunctiva/sclera: Conjunctivae normal.     Pupils: Pupils are equal, round, and reactive to light.  Neck:     Vascular: No carotid bruit.     Trachea: No tracheal deviation.  Cardiovascular:     Rate and Rhythm: Normal rate and regular rhythm.     Pulses: Normal pulses.     Heart sounds: Normal heart sounds. No murmur heard.    No friction rub. No gallop.  Pulmonary:     Effort: Pulmonary effort is normal. No respiratory distress.     Breath sounds: Normal breath sounds.  Abdominal:     General: There is no  distension.     Palpations: Abdomen is soft. There is no mass.     Tenderness: There is no abdominal tenderness.  Genitourinary:    Comments: No cva tenderness.  Musculoskeletal:        General: No swelling or tenderness.     Cervical back: Normal range of motion and neck supple. No rigidity. No muscular tenderness.     Right lower leg: No edema.     Left lower leg: No edema.  Skin:    General: Skin is warm and dry.     Findings: No rash.  Neurological:     Mental Status: She is alert.     Comments: Alert, speech normal. Motor/sens grossly intact bil. Steady gait.   Psychiatric:        Mood and Affect: Mood normal.      ED Results / Procedures / Treatments   Labs (all labs ordered are listed, but only abnormal results are displayed) Results for orders placed or performed during the hospital encounter of 07/29/23  CBC  Result Value Ref Range   WBC 7.1 4.0 - 10.5 K/uL   RBC 3.61 (L) 3.87 - 5.11 MIL/uL   Hemoglobin 10.8 (L) 12.0 - 15.0 g/dL   HCT 29.5 (L) 62.1 - 30.8 %   MCV 90.0 80.0 - 100.0 fL   MCH 29.9 26.0 - 34.0 pg   MCHC 33.2 30.0 - 36.0 g/dL   RDW 65.7 84.6 - 96.2 %   Platelets 196 150 - 400 K/uL   nRBC 0.0 0.0 - 0.2 %  Basic metabolic panel  Result Value Ref Range   Sodium 139 135 - 145 mmol/L   Potassium 3.8 3.5 - 5.1 mmol/L   Chloride 105 98 - 111 mmol/L   CO2 26 22 - 32 mmol/L   Glucose, Bld 94 70 - 99 mg/dL   BUN 24 (H) 8 - 23 mg/dL   Creatinine, Ser 9.52 (H) 0.44 - 1.00 mg/dL   Calcium 9.1 8.9 - 84.1 mg/dL   GFR, Estimated 45 (L) >60 mL/min   Anion gap 8 5 - 15   MR BREAST BILATERAL W WO CONTRAST INC CAD  Result Date: 07/10/2023 CLINICAL DATA:  Supplemental screening. High risk. Patient has history of LEFT breast cancer and mastectomy with chemotherapy in 2006. Family history of breast cancer in 3 sisters with breast cancer, diagnosed in their 30s. EXAM: BILATERAL BREAST MRI WITH AND WITHOUT CONTRAST TECHNIQUE: Multiplanar, multisequence MR images of both breasts were obtained prior to and following the intravenous administration of 6 ml of Gadavist Three-dimensional MR images were rendered by post-processing of the original MR data on an independent workstation. The three-dimensional MR images were interpreted, and findings are reported in the following complete MRI report for this study. Three dimensional images were evaluated at the independent interpreting workstation using the DynaCAD thin client. COMPARISON:  Previous exam(s). FINDINGS: Breast composition: c. Heterogeneous fibroglandular tissue. Background parenchymal enhancement: Minimal Right breast: No mass or abnormal  enhancement. Left breast: Prior LEFT mastectomy.  Chest wall is unremarkable. Lymph nodes: No abnormal appearing lymph nodes. Ancillary findings:  None. IMPRESSION: 1. Prior LEFT mastectomy. 2. No evidence for malignancy. RECOMMENDATION: Screening mammogram is recommended in October 2024. Based on the recommendations of the American Cancer Society, annual screening MRI is suggested in addition to annual mammography if the patient has an estimated lifetime risk of developing breast cancer which is greater than 20%. BI-RADS CATEGORY  2: Benign. Electronically Signed   By: Norva Pavlov  M.D.   On: 07/10/2023 11:47    EKG EKG Interpretation Date/Time:  Wednesday July 29 2023 18:42:15 EDT Ventricular Rate:  80 PR Interval:  136 QRS Duration:  76 QT Interval:  424 QTC Calculation: 489 R Axis:   25  Text Interpretation: Sinus rhythm with Premature atrial complexes Nonspecific T wave abnormality Confirmed by Cathren Laine ([id number]) on 07/29/2023 7:02:09 PM  Radiology No results found.  Procedures Procedures    Medications Ordered in ED Medications - No data to display  ED Course/ Medical Decision Making/ A&P                                 Medical Decision Making Problems Addressed: Adverse effect of drug, initial encounter:    Details: Possible, acute/chronic Low blood pressure reading: acute illness or injury with systemic symptoms that poses a threat to life or bodily functions Near syncope: acute illness or injury with systemic symptoms that poses a threat to life or bodily functions Stage 3a chronic kidney disease (HCC): chronic illness or injury with exacerbation, progression, or side effects of treatment that poses a threat to life or bodily functions  Amount and/or Complexity of Data Reviewed External Data Reviewed: labs and notes. Labs: ordered. Decision-making details documented in ED Course. ECG/medicine tests: ordered and independent interpretation performed.  Decision-making details documented in ED Course.  Risk Decision regarding hospitalization.   Iv ns. Continuous pulse ox and cardiac monitoring. Labs ordered/sent.   Differential diagnosis includes near syncope, med rxn, dehydration, anemia, etc. Dispo decision including potential need for admission considered - will get labs  and reassess.   Reviewed nursing notes and prior charts for additional history. External reports reviewed.   Cardiac monitor: sinus rhythm, rate 80.  Labs reviewed/interpreted by me - wbc normal. Hgb c/w baseline. Cr mildly elev c/w baseline.   No current symptoms, faintness or dizziness. Ambulates in hall, no faintness. Bp normal.   Rec close pcp f/u.  Return precautions provided.          Final Clinical Impression(s) / ED Diagnoses Final diagnoses:  None    Rx / DC Orders ED Discharge Orders     None         Cathren Laine, MD 07/29/23 2046

## 2023-07-29 NOTE — ED Notes (Signed)
Apple juice given.  

## 2023-07-29 NOTE — ED Triage Notes (Signed)
Took eye drops around 1350 for glaucoma. BP drops after taking. Today BP dropped to 70's with home BP cuff. Felt dizzy. Took longer than usually to come back up.

## 2023-07-29 NOTE — ED Notes (Signed)
Pt ambulated without difficulty

## 2023-07-29 NOTE — Discharge Instructions (Addendum)
It was our pleasure to provide your ER care today - we hope that you feel better.  Overall your lab test results appear similar to your baseline. Your blood pressure and vital signs are normal.  Make sure to drink plenty of fluids/stay well hydrated.   Follow up closely with primary care doctor in the coming week - discuss earlier low blood pressure after using your eye drops, and discuss possible adjustment of medications with your doctor.   Return to ER if worse, new symptoms, fevers, chest pain, trouble breathing, fainting, or other concern.

## 2023-09-13 ENCOUNTER — Other Ambulatory Visit: Payer: Self-pay

## 2023-09-13 ENCOUNTER — Emergency Department (HOSPITAL_COMMUNITY)
Admission: EM | Admit: 2023-09-13 | Discharge: 2023-09-13 | Disposition: A | Discharge status: Home / Self Care | Payer: Medicare Other | Attending: Emergency Medicine | Admitting: Emergency Medicine

## 2023-09-13 DIAGNOSIS — I10 Essential (primary) hypertension: Secondary | ICD-10-CM | POA: Insufficient documentation

## 2023-09-13 DIAGNOSIS — R519 Headache, unspecified: Secondary | ICD-10-CM | POA: Diagnosis present

## 2023-09-13 DIAGNOSIS — Z79899 Other long term (current) drug therapy: Secondary | ICD-10-CM | POA: Insufficient documentation

## 2023-09-13 LAB — CBC WITH DIFFERENTIAL/PLATELET
Abs Immature Granulocytes: 0.01 10*3/uL (ref 0.00–0.07)
Basophils Absolute: 0 10*3/uL (ref 0.0–0.1)
Basophils Relative: 0 %
Eosinophils Absolute: 0 10*3/uL (ref 0.0–0.5)
Eosinophils Relative: 0 %
HCT: 33.3 % — ABNORMAL LOW (ref 36.0–46.0)
Hemoglobin: 11.1 g/dL — ABNORMAL LOW (ref 12.0–15.0)
Immature Granulocytes: 0 %
Lymphocytes Relative: 41 %
Lymphs Abs: 2.8 10*3/uL (ref 0.7–4.0)
MCH: 29.8 pg (ref 26.0–34.0)
MCHC: 33.3 g/dL (ref 30.0–36.0)
MCV: 89.5 fL (ref 80.0–100.0)
Monocytes Absolute: 0.9 10*3/uL (ref 0.1–1.0)
Monocytes Relative: 13 %
Neutro Abs: 3 10*3/uL (ref 1.7–7.7)
Neutrophils Relative %: 46 %
Platelets: 214 10*3/uL (ref 150–400)
RBC: 3.72 MIL/uL — ABNORMAL LOW (ref 3.87–5.11)
RDW: 14.8 % (ref 11.5–15.5)
WBC: 6.7 10*3/uL (ref 4.0–10.5)
nRBC: 0 % (ref 0.0–0.2)

## 2023-09-13 LAB — COMPREHENSIVE METABOLIC PANEL
ALT: 21 U/L (ref 0–44)
AST: 23 U/L (ref 15–41)
Albumin: 4.1 g/dL (ref 3.5–5.0)
Alkaline Phosphatase: 42 U/L (ref 38–126)
Anion gap: 13 (ref 5–15)
BUN: 9 mg/dL (ref 8–23)
CO2: 22 mmol/L (ref 22–32)
Calcium: 10.2 mg/dL (ref 8.9–10.3)
Chloride: 107 mmol/L (ref 98–111)
Creatinine, Ser: 0.9 mg/dL (ref 0.44–1.00)
GFR, Estimated: >60 mL/min (ref >60)
Glucose, Bld: 105 mg/dL — ABNORMAL HIGH (ref 70–99)
Potassium: 3.2 mmol/L — ABNORMAL LOW (ref 3.5–5.1)
Sodium: 142 mmol/L (ref 135–145)
Total Bilirubin: 0.9 mg/dL (ref 0.3–1.2)
Total Protein: 7.6 g/dL (ref 6.5–8.1)

## 2023-09-13 MED ORDER — AMLODIPINE BESYLATE 5 MG PO TABS: 5.0000 mg | ORAL_TABLET | Freq: Every day | ORAL | 0 refills | Status: DC

## 2023-09-13 MED ORDER — ACETAMINOPHEN 325 MG PO TABS
650.0000 mg | ORAL_TABLET | Freq: Once | ORAL | Status: AC
  Administered 2023-09-13: 650 mg via ORAL
  Filled 2023-09-13: qty 2

## 2023-09-13 MED ORDER — AMLODIPINE BESYLATE 5 MG PO TABS
5.0000 mg | ORAL_TABLET | Freq: Once | ORAL | Status: AC
  Administered 2023-09-13: 5 mg via ORAL
  Filled 2023-09-13: qty 1

## 2023-09-13 MED ORDER — POTASSIUM CHLORIDE CRYS ER 20 MEQ PO TBCR
40.0000 meq | EXTENDED_RELEASE_TABLET | Freq: Once | ORAL | Status: AC
  Administered 2023-09-13: 40 meq via ORAL
  Filled 2023-09-13: qty 2

## 2023-09-13 NOTE — ED Notes (Signed)
Pt ambulatory to bathroom

## 2023-09-13 NOTE — Discharge Instructions (Signed)
You were seen in the emergency department for your high blood pressure and your headache.  You had no signs of stroke or injury to your kidneys from your blood pressure being high.  It is likely related to stopping your other blood pressure medication.  I have given you prescription for a new medication called amlodipine and you should take this daily in addition to your losartan.  You should continue to check your blood pressure daily.  I recommend checking once a day at the same time every day but and you have been resting for at least 15 to 20 minutes to get an accurate reading and you can bring this log to your primary doctor to have have your blood pressure rechecked and to determine if you need further medication changes.  You should return to the emergency department for significantly worsening headache, repetitive vomiting, numbness or weakness on one side the body compared to the other or any other new or concerning symptoms.

## 2023-09-13 NOTE — ED Triage Notes (Signed)
Patient reports elevated blood pressure this morning BP= 197/109 . Mild headache and lightheaded .

## 2023-09-13 NOTE — ED Provider Notes (Signed)
[place] EMERGENCY DEPARTMENT AT [facility] Provider Note   CSN: [id number] Arrival date & time: 09/13/23  4098     History  Chief Complaint  Patient presents with   Hypertension    BP=197/109    [name] is a 77 y.o. female.  Patient is a 77 year old female with a past medical history of hypertension and glaucoma presenting to the emergency department with hypertension.  The patient states that over the summer her blood pressures were running low and she was having increased urinary frequency waking her up at night.  Her blood pressure had been controlled at her last primary doctor's visit in September so they stopped her hydrochlorothiazide.  She states over the last several days her blood pressure has been running high anywhere between the 140s to the 180s.  She states that she has also been having headaches the last couple of days.  She states that she will wake up with a headache and take Tylenol and the headache resolves.  She denies any associated vision changes, nausea, vomiting, numbness or weakness.  The history is provided by the patient and the spouse.  Hypertension       Home Medications Prior to Admission medications   Medication Sig Start Date End Date Taking? Authorizing Provider  amLODipine (NORVASC) 5 MG tablet Take 1 tablet (5 mg total) by mouth daily. 09/13/23  Yes Theresia Lo, Jeilyn Reznik K, DO  atorvastatin (LIPITOR) 80 MG tablet Take 80 mg by mouth daily.    [provider]  brimonidine (ALPHAGAN) 0.2 % ophthalmic solution SMARTSIG:In Eye(s) 07/20/21   [provider]  Cholecalciferol (VITAMIN D) 1000 UNITS capsule Take 1,000 Units by mouth daily.    [provider]  dorzolamide-timolol (COSOPT) 22.3-6.8 MG/ML ophthalmic solution Place 1 drop into both eyes 2 (two) times daily.    [provider]  fluconazole (DIFLUCAN) 150 MG tablet Take on tab po every other day x 3 doses 04/10/23   Genia Del, MD   latanoprost (XALATAN) 0.005 % ophthalmic solution SMARTSIG:In Eye(s) 06/17/21   [provider]  losartan-hydrochlorothiazide (HYZAAR) 100-12.5 MG tablet Take 1 tablet by mouth daily. 06/17/21   [provider]      Allergies    Patient has no known allergies.    Review of Systems   Review of Systems  Physical Exam Updated Vital Signs BP (!) 181/97   Pulse 87   Temp 98.1 F (36.7 C)   Resp (!) 22   SpO2 100%  Physical Exam Vitals and nursing note reviewed.  Constitutional:      General: She is not in acute distress.    Appearance: Normal appearance.  HENT:     Head: Normocephalic and atraumatic.     Nose: Nose normal.     Mouth/Throat:     Mouth: Mucous membranes are moist.     Pharynx: Oropharynx is clear.  Eyes:     Extraocular Movements: Extraocular movements intact.     Conjunctiva/sclera: Conjunctivae normal.     Pupils: Pupils are equal, round, and reactive to light.  Cardiovascular:     Rate and Rhythm: Normal rate and regular rhythm.     Heart sounds: Normal heart sounds.  Pulmonary:     Effort: Pulmonary effort is normal.     Breath sounds: Normal breath sounds.  Abdominal:     General: Abdomen is flat.     Palpations: Abdomen is soft.     Tenderness: There is no abdominal tenderness.  Musculoskeletal:  General: Normal range of motion.     Cervical back: Normal range of motion and neck supple.  Skin:    General: Skin is warm and dry.  Neurological:     General: No focal deficit present.     Mental Status: She is alert and oriented to person, place, and time.     Cranial Nerves: No cranial nerve deficit.     Sensory: No sensory deficit.     Motor: No weakness.  Psychiatric:        Mood and Affect: Mood normal.        Behavior: Behavior normal.     ED Results / Procedures / Treatments   Labs (all labs ordered are listed, but only abnormal results are displayed) Labs Reviewed  CBC WITH DIFFERENTIAL/PLATELET - Abnormal;  Notable for the following components:      Result Value   RBC 3.72 (*)    Hemoglobin 11.1 (*)    HCT 33.3 (*)    All other components within normal limits  COMPREHENSIVE METABOLIC PANEL - Abnormal; Notable for the following components:   Potassium 3.2 (*)    Glucose, Bld 105 (*)    All other components within normal limits    EKG EKG Interpretation Date/Time:  Sunday September 13 2023 82:95:62 EDT Ventricular Rate:  109 PR Interval:  161 QRS Duration:  84 QT Interval:  358 QTC Calculation: 483 R Axis:   -5  Text Interpretation: Sinus tachycardia Minimal ST depression, diffuse leads No significant change since last tracing Confirmed by Elayne Snare ([id number]) on 09/13/2023 7:52:20 AM  Radiology No results found.  Procedures Procedures    Medications Ordered in ED Medications  potassium chloride SA (KLOR-CON M) CR tablet 40 mEq (40 mEq Oral Given 09/13/23 0724)  amLODipine (NORVASC) tablet 5 mg (5 mg Oral Given 09/13/23 0814)  acetaminophen (TYLENOL) tablet 650 mg (650 mg Oral Given 09/13/23 1308)    ED Course/ Medical Decision Making/ A&P Clinical Course as of 09/13/23 0931  Sun Sep 13, 2023  0928 Upon reassessment, patient's blood pressure has improved to the 160s systolic, headache resolved.  Patient is stable for discharge home and will be started on amlodipine.  Was recommended primary care follow-up for blood pressure recheck. [VK]    Clinical Course User Index [VK] Rexford Maus, DO                                 Medical Decision Making This patient presents to the ED with chief complaint(s) of HTN, HA with pertinent past medical history of HTN, glaucoma which further complicates the presenting complaint. The complaint involves an extensive differential diagnosis and also carries with it a high risk of complications and morbidity.    The differential diagnosis includes asymptomatic hypertension, hypertensive urgency, hypertensive emergency, headache was  not sudden onset without focal deficits making ICH or mass effect unlikely  Additional history obtained: Additional history obtained from family Records reviewed Primary Care Documents  ED Course and Reassessment: On patient's arrival she was hypertensive to the 190s and mildly tachycardic otherwise hemodynamically stable in no acute distress.  She is initially evaluated by triage and had labs performed that showed mild hypokalemia otherwise within normal range, no signs of endorgan damage on labs or exam.  Patient had initially reported in triage she had dizziness with her headache, denies this to myself will be EKG to evaluate for arrhythmia.  She has had  multiple high blood pressure readings on multiple occasions and likely needs to be started on a second antihypertensive agent.  She will be given amlodipine and Tylenol for her headache and will be closely reassessed.  Independent labs interpretation:  The following labs were independently interpreted: Mild hypokalemia otherwise within normal range  Independent visualization of imaging: -N/A  Consultation: - Consulted or discussed management/test interpretation w/ external professional: N/A  Consideration for admission or further workup: Patient has no emergent conditions requiring admission or further work-up at this time and is stable for discharge home with primary care follow-up  Social Determinants of health: N/A    Amount and/or Complexity of Data Reviewed Labs: ordered.  Risk OTC drugs. Prescription drug management.          Final Clinical Impression(s) / ED Diagnoses Final diagnoses:  Uncontrolled hypertension  Acute nonintractable headache, unspecified headache type    Rx / DC Orders ED Discharge Orders          Ordered    amLODipine (NORVASC) 5 MG tablet  Daily        09/13/23 0928              Rexford Maus, DO 09/13/23 [phone]

## 2023-09-14 ENCOUNTER — Telehealth: Payer: Self-pay

## 2023-09-14 NOTE — Telephone Encounter (Signed)
Pt called stating that she went to the hospital with her BP up and down. She needed direction on who to contact for f/u.  Reviewed pt's chart, returned call for clarification, no answer, vm full.  Called home number, as directed, no answer, no vm.

## 2023-11-27 ENCOUNTER — Other Ambulatory Visit: Payer: Self-pay

## 2023-11-27 DIAGNOSIS — Z8249 Family history of ischemic heart disease and other diseases of the circulatory system: Secondary | ICD-10-CM

## 2023-11-27 DIAGNOSIS — I6523 Occlusion and stenosis of bilateral carotid arteries: Secondary | ICD-10-CM

## 2023-12-08 ENCOUNTER — Ambulatory Visit (HOSPITAL_COMMUNITY)
Admission: RE | Admit: 2023-12-08 | Discharge: 2023-12-08 | Disposition: A | Discharge status: Home / Self Care | Payer: Medicare Other | Source: Ambulatory Visit | Attending: Vascular Surgery | Admitting: Vascular Surgery

## 2023-12-08 ENCOUNTER — Encounter: Payer: Self-pay | Admitting: Physician Assistant

## 2023-12-08 ENCOUNTER — Ambulatory Visit (INDEPENDENT_AMBULATORY_CARE_PROVIDER_SITE_OTHER): Payer: Medicare Other | Admitting: Physician Assistant

## 2023-12-08 VITALS — BP 153/84 | HR 84 | Temp 98.1°F | Wt 121.0 lb

## 2023-12-08 DIAGNOSIS — I10 Essential (primary) hypertension: Secondary | ICD-10-CM | POA: Insufficient documentation

## 2023-12-08 DIAGNOSIS — E785 Hyperlipidemia, unspecified: Secondary | ICD-10-CM | POA: Diagnosis not present

## 2023-12-08 DIAGNOSIS — Z8249 Family history of ischemic heart disease and other diseases of the circulatory system: Secondary | ICD-10-CM

## 2023-12-08 DIAGNOSIS — I6523 Occlusion and stenosis of bilateral carotid arteries: Secondary | ICD-10-CM

## 2023-12-08 DIAGNOSIS — I779 Disorder of arteries and arterioles, unspecified: Secondary | ICD-10-CM | POA: Diagnosis not present

## 2023-12-08 NOTE — Progress Notes (Signed)
 Office Note     CC:  follow up Requesting Provider:  Tisovec, Richard W, MD  HPI: [name] is a 78 y.o. (August 12, 1946) female who presents for surveillance of carotid artery stenosis.  She denies any diagnosis of CVA or TIA since last office visit.  She also has not had any neurological events including slurring speech, changes in vision, or one-sided weakness.  She is a former smoker.  She follows regularly with her PCP for management of chronic medical conditions including hypertension and hyperlipidemia.  She recently had a blood pressure regimen change by a specialist with [facility].  She states her blood pressure has been better controlled since medication change.  She was also screened for a AAA.  She has a positive family history with her grandmother having had a AAA.  She denies any new or changing abdominal or back pain.   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 Right tube and ovary never identified   BREAST SURGERY  08/29/2005   TOTAL LEFT MASTECTOMY W/ NODE DISSECTION X4   CARPAL TUNNEL RELEASE  2007   RIGHT   CATARACT EXTRACTION     Right eye   CATARACT EXTRACTION Left 07/2020   DILATION AND CURETTAGE OF UTERUS  1968   dx laparoscopy  07/31/2011   resulted into open salpingo-opphorectomy    ECTOPIC PREGNANCY SURGERY  73 & 74   EYE SURGERY  2001   RIGHT EYE FOR GLAUCOMA   HAND SURGERY     HYSTEROSCOPY WITH D & C  11/05/2012   Procedure: DILATATION AND CURETTAGE /HYSTEROSCOPY;  Surgeon: Toribio LITTIE Campanile, MD;  Location: [facility] [place];  Service: Gynecology;  Laterality: N/A;   left eye surgery  2022   PORTA CATH     TRANSTHORACIC ECHOCARDIOGRAM  10/14/2005   LVSF NORMAL/ EF 55-65%/ MILD MITRAL REGURG    Social History   Socioeconomic History   Marital status: Married    Spouse name: Not on file   Number of children: Not on file   Years of education: Not on file   Highest education level: Not on file  Occupational History   Not on file  Tobacco Use   Smoking status: Former   Smokeless tobacco: Never   Tobacco comments:    smoking for a few yrs >5  , quit many yrs ago  Vaping Use   Vaping status: Never Used  Substance and Sexual Activity   Alcohol  use: No    Alcohol /week: 0.0 standard drinks of alcohol    Drug use: No   Sexual activity: Yes    Partners: Male    Birth control/protection: Post-menopausal    Comment: 1st intercourse 39 yo-1 partner  Other Topics Concern   Not on file  Social History Narrative   Not on file   Social Drivers of Health   Financial Resource Strain: Low Risk  (10/06/2023)   [facility]   Overall Financial Resource Strain (CARDIA)    Difficulty of Paying Living Expenses: Not hard at all  Food Insecurity: No Food Insecurity (10/06/2023)   Received from [facility]   Hunger Vital Sign    Worried About Running Out of Food in the Last Year: Never true    Ran Out of Food in the Last Year: Never true  Transportation Needs: No Transportation Needs (10/06/2023)   Received from [facility] - Transportation    In the past 12 months, has lack of transportation kept you from medical appointments or from getting medications?: No    Lack of Transportation (Non-Medical): No  Physical Activity: Not on file  Stress: Not on file  Social Connections: Not on file  Intimate Partner Violence: Not on file    Family History  Problem Relation Age of Onset    Hypertension Mother    Cancer Father        COLON   Breast cancer Sister        Age 73's   Diabetes Sister    Stroke Sister    Breast cancer Sister        Age 58's   Stroke Sister    Breast cancer Sister        Age 5's   Breast cancer Paternal Grandmother        Age unknown   Cancer Paternal Grandfather        Unknown type    Current Outpatient Medications  Medication Sig Dispense Refill   atorvastatin (LIPITOR) 80 MG tablet Take 80 mg by mouth daily.     brimonidine (ALPHAGAN) 0.2 % ophthalmic solution SMARTSIG:In Eye(s)     Cholecalciferol (VITAMIN D ) 1000 UNITS capsule Take 1,000 Units by mouth daily.     dorzolamide-timolol (COSOPT) 22.3-6.8 MG/ML ophthalmic solution Place 1 drop into both eyes 2 (two) times daily.     fluconazole  (DIFLUCAN ) 150 MG tablet Take on tab po every other day x 3 doses 3 tablet 0   latanoprost (XALATAN) 0.005 % ophthalmic solution SMARTSIG:In Eye(s)     losartan-hydrochlorothiazide (HYZAAR) 100-12.5 MG tablet Take 1 tablet by mouth daily.     NIFEdipine (PROCARDIA XL/NIFEDICAL XL) 60 MG 24 hr tablet Take 60 mg by mouth daily.     amLODipine  (NORVASC ) 5 MG tablet Take 1 tablet (5 mg total) by mouth daily. (Patient not taking: Reported on 12/08/2023) 30 tablet 0   No current facility-administered medications for this visit.    No Known Allergies   REVIEW OF SYSTEMS:   [X]  denotes positive finding, [ ]  denotes negative finding Cardiac  Comments:  Chest pain or chest pressure:    Shortness of breath upon exertion:    Short of breath when lying flat:    Irregular heart rhythm:        Vascular    Pain in calf, thigh, or hip brought on by ambulation:    Pain in feet at night that wakes you up from your sleep:     Blood clot in your veins:    Leg swelling:         Pulmonary    Oxygen at home:    Productive cough:     Wheezing:         Neurologic    Sudden weakness in arms or legs:     Sudden numbness in arms or legs:     Sudden onset  of difficulty speaking or  slurred speech:    Temporary loss of vision in one eye:     Problems with dizziness:         Gastrointestinal    Blood in stool:     Vomited blood:         Genitourinary    Burning when urinating:     Blood in urine:        Psychiatric    Major depression:         Hematologic    Bleeding problems:    Problems with blood clotting too easily:        Skin    Rashes or ulcers:        Constitutional    Fever or chills:      PHYSICAL EXAMINATION:  Vitals:   12/08/23 0916  BP: (!) 153/84  Pulse: 84  Temp: 98.1 F (36.7 C)  TempSrc: Temporal  SpO2: 98%  Weight: 121 lb (54.9 kg)    General:  WDWN in NAD; vital signs documented above Gait: Not observed HENT: WNL, normocephalic Pulmonary: normal non-labored breathing , without Rales, rhonchi,  wheezing Cardiac: regular HR Abdomen: soft, NT, no masses Skin: without rashes Vascular Exam/Pulses: palpable radial pulses Extremities: without ischemic changes, without Gangrene , without cellulitis; without open wounds Musculoskeletal: no muscle wasting or atrophy  Neurologic: A&O X 3 Psychiatric:  The pt has Normal affect.   Non-Invasive Vascular Imaging:   R ICA 40-59% L ICA 1-39%  AAA duplex negative  ASSESSMENT/PLAN:: 78 y.o. female here for follow up for surveillance of carotid artery stenosis  Subjectively, the patient has not experienced any neurological events since last office visit.  Carotid duplex shows stable 40 to 59% stenosis of the right ICA.  She has 1 to 39% stenosis of the left ICA.  No indication for revascularization of asymptomatic right ICA stenosis at this time.  Repeat duplex in 1 year.  Continue statin daily.  AAA duplex was negative for AAA.  We won't repeat this study.   Donnice Sender, PA-C [facility] [phone]  Clinic MD:   [place]

## 2023-12-15 ENCOUNTER — Other Ambulatory Visit: Payer: Self-pay

## 2023-12-15 DIAGNOSIS — I6523 Occlusion and stenosis of bilateral carotid arteries: Secondary | ICD-10-CM

## 2024-06-07 NOTE — Assessment & Plan Note (Signed)
 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  Annual breast MRIs ordered 06/09/2023: Breast exam: Benign  09/10/2022: Mammogram at [facility]: Benign breast density category C 07/10/2023: Breast MRI: Benign   Bone density 10/11/2021: T score -1.2 Return to clinic in 1 year for follow-up

## 2024-06-08 ENCOUNTER — Inpatient Hospital Stay: Payer: Medicare Other | Attending: Hematology and Oncology | Admitting: Hematology and Oncology

## 2024-06-08 VITALS — BP 118/64 | HR 81 | Temp 98.7°F | Resp 18 | Ht 65.0 in | Wt 116.6 lb

## 2024-06-08 DIAGNOSIS — H409 Unspecified glaucoma: Secondary | ICD-10-CM | POA: Insufficient documentation

## 2024-06-08 DIAGNOSIS — Z79899 Other long term (current) drug therapy: Secondary | ICD-10-CM | POA: Diagnosis not present

## 2024-06-08 DIAGNOSIS — R634 Abnormal weight loss: Secondary | ICD-10-CM | POA: Diagnosis not present

## 2024-06-08 DIAGNOSIS — Z853 Personal history of malignant neoplasm of breast: Secondary | ICD-10-CM | POA: Diagnosis present

## 2024-06-08 DIAGNOSIS — R630 Anorexia: Secondary | ICD-10-CM | POA: Diagnosis not present

## 2024-06-08 DIAGNOSIS — C50312 Malignant neoplasm of lower-inner quadrant of left female breast: Secondary | ICD-10-CM

## 2024-06-08 DIAGNOSIS — Z800 Family history of malignant neoplasm of digestive organs: Secondary | ICD-10-CM | POA: Insufficient documentation

## 2024-06-08 DIAGNOSIS — Z170 Estrogen receptor positive status [ER+]: Secondary | ICD-10-CM

## 2024-06-08 DIAGNOSIS — Z9221 Personal history of antineoplastic chemotherapy: Secondary | ICD-10-CM | POA: Diagnosis not present

## 2024-06-08 DIAGNOSIS — Z9012 Acquired absence of left breast and nipple: Secondary | ICD-10-CM | POA: Diagnosis not present

## 2024-06-08 DIAGNOSIS — Z1509 Genetic susceptibility to other malignant neoplasm: Secondary | ICD-10-CM

## 2024-06-08 DIAGNOSIS — Z1501 Genetic susceptibility to malignant neoplasm of breast: Secondary | ICD-10-CM | POA: Diagnosis not present

## 2024-06-08 NOTE — Progress Notes (Signed)
 Patient Care Team: Tisovec, Charlie ORN, MD as PCP - General (Internal Medicine) Odean Potts, MD as Consulting Physician (Hematology and Oncology) Donnald Charleston, MD as Consulting Physician (Gastroenterology) Bond, Reyes Mcardle, MD as Referring Physician (Ophthalmology)  DIAGNOSIS:  Encounter Diagnoses  Name Primary?   Malignant neoplasm of lower-inner quadrant of left breast in female, estrogen receptor positive (HCC) Yes   BRCA2 positive     SUMMARY OF ONCOLOGIC HISTORY: Oncology History  Cancer of lower-inner quadrant of left female breast (HCC)  08/29/2005 Surgery   Left breast mastectomy: Multicentric invasive ductal carcinoma 2.5 and 1.5 cm grade 3 with high-grade DCIS 1/5 lymph node with Micrometastases ER 99%, PR 0%, Ki-67 45%, HER-2 2+ fish negative   10/14/2005 - 01/23/2006 Chemotherapy   Adjuvant chemotherapy with 6 cycles of FEC   02/23/2006 - 10/25/2013 Anti-estrogen oral therapy   Letrozole  2.5 mg daily changed to tamoxifen  due to pain but she did not take this switched to raloxifene  stopped 2014   09/04/2013 Surgery   Bilateral salpingo-oophorectomy (genetic testing revealed BRCA2 gene mutation positivity)     CHIEF COMPLIANT: Surveillance of breast cancer  HISTORY OF PRESENT ILLNESS:  History of Present Illness [name] is a 78 year old female with hypertension who presents with blood pressure fluctuations and weight loss. She was referred by a heart specialist at [facility] for evaluation of her blood pressure management.  She experiences significant fluctuations in blood pressure, with episodes of both hypertension and hypotension. Systolic readings at home have been as low as 60 mmHg, accompanied by weakness and dyspnea. These fluctuations began after a viral infection a couple of years ago. Adjustments to her blood pressure medication by a heart specialist have led to some improvement.  She has lost weight from 140 pounds to 116 pounds, with an unclear  etiology. She has a decreased appetite and struggles with sleep. Brimonidine eye drops for glaucoma contribute to sleepiness, but she does not achieve restful sleep.     ALLERGIES:  has no known allergies.  MEDICATIONS:  Current Outpatient Medications  Medication Sig Dispense Refill   atorvastatin (LIPITOR) 80 MG tablet Take 80 mg by mouth daily.     brimonidine (ALPHAGAN) 0.2 % ophthalmic solution SMARTSIG:In Eye(s)     Cholecalciferol (VITAMIN D ) 1000 UNITS capsule Take 1,000 Units by mouth daily.     dorzolamide-timolol (COSOPT) 22.3-6.8 MG/ML ophthalmic solution Place 1 drop into both eyes 2 (two) times daily.     latanoprost (XALATAN) 0.005 % ophthalmic solution SMARTSIG:In Eye(s)     NIFEdipine (PROCARDIA XL/NIFEDICAL XL) 60 MG 24 hr tablet Take 60 mg by mouth daily.     No current facility-administered medications for this visit.    PHYSICAL EXAMINATION: ECOG PERFORMANCE STATUS: 1 - Symptomatic but completely ambulatory  Vitals:   06/08/24 1445  BP: 118/64  Pulse: 81  Resp: 18  Temp: 98.7 F (37.1 C)  SpO2: 99%   Filed Weights   06/08/24 1445  Weight: 116 lb 9.6 oz (52.9 kg)    Physical Exam MEASUREMENTS: Weight- 116.  (exam performed in the presence of a chaperone)  LABORATORY DATA:  I have reviewed the data as listed    Latest Ref Rng & Units 09/13/2023    6:32 AM 07/29/2023    6:47 PM 06/12/2022    2:04 PM  CMP  Glucose 70 - 99 mg/dL 894  94  86   BUN 8 - 23 mg/dL 9  24  19    Creatinine 0.44 -  1.00 mg/dL 9.09  8.76  8.79   Sodium 135 - 145 mmol/L 142  139  143   Potassium 3.5 - 5.1 mmol/L 3.2  3.8  3.4   Chloride 98 - 111 mmol/L 107  105  111   CO2 22 - 32 mmol/L 22  26  23    Calcium 8.9 - 10.3 mg/dL 89.7  9.1  9.3   Total Protein 6.5 - 8.1 g/dL 7.6     Total Bilirubin 0.3 - 1.2 mg/dL 0.9     Alkaline Phos 38 - 126 U/L 42     AST 15 - 41 U/L 23     ALT 0 - 44 U/L 21       Lab Results  Component Value Date   WBC 6.7 09/13/2023   HGB 11.1 (L)  09/13/2023   HCT 33.3 (L) 09/13/2023   MCV 89.5 09/13/2023   PLT 214 09/13/2023   NEUTROABS 3.0 09/13/2023    ASSESSMENT & PLAN:  Cancer of lower-inner quadrant of left female breast [facility]) 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 Return to clinic in 1 year for follow-up   ------------------------------------- Assessment and Plan Assessment & Plan Hypertensive crisis, unspecified Fluctuating blood pressure with episodes of hypertension and hypotension. Cardiologist-adjusted antihypertensive medication improved condition. Other causes ruled out by blood tests. - Regular blood pressure monitoring.  Unintentional weight loss Weight decreased from 140 lbs to 116 lbs, likely due to low appetite. Appetite stimulants have side effects and are not suitable.   Glaucoma Uses brimonidine eye drops causing drowsiness.      Orders Placed This Encounter  Procedures   MR BREAST BILATERAL W WO CONTRAST INC CAD    Standing Status:   Future    Expected Date:   07/11/2024    Expiration Date:   06/08/2025    If indicated for the ordered procedure, I authorize the administration of contrast media per Radiology protocol:   Yes    What is the patient's sedation requirement?:   No Sedation    Does the patient have a pacemaker or implanted devices?:   No    Radiology Contrast Protocol - do NOT remove file path:   [email or web address]    Preferred imaging location?:   [facility] (table limit-550lbs)    Release to patient:   Immediate   The patient has a good understanding of the overall plan. she agrees with it. she will call with any problems that may develop before the next visit here. Total time spent: 30 mins including face to face time and time spent for planning, charting and co-ordination of care   Viinay K Corneisha Alvi, MD 06/08/24

## 2024-06-13 ENCOUNTER — Ambulatory Visit (INDEPENDENT_AMBULATORY_CARE_PROVIDER_SITE_OTHER): Admitting: Obstetrics and Gynecology

## 2024-06-13 ENCOUNTER — Encounter: Payer: Self-pay | Admitting: Obstetrics and Gynecology

## 2024-06-13 VITALS — BP 122/64 | HR 91 | Temp 98.3°F | Wt 116.4 lb

## 2024-06-13 DIAGNOSIS — N941 Unspecified dyspareunia: Secondary | ICD-10-CM | POA: Diagnosis not present

## 2024-06-13 DIAGNOSIS — R300 Dysuria: Secondary | ICD-10-CM | POA: Diagnosis not present

## 2024-06-13 DIAGNOSIS — N9089 Other specified noninflammatory disorders of vulva and perineum: Secondary | ICD-10-CM | POA: Diagnosis not present

## 2024-06-13 NOTE — Progress Notes (Signed)
 78 y.o. G28P0020 female with BRCA2 gene mutation, left Breast Ca s/p left mastectomy (2006), prior BSO (2012) here for pelvic pain. Married.  No LMP recorded. Patient is postmenopausal.  She reports states that after sex she hurts and burns.  She has uterine and pelvic pain.  She also has some burning with urination. She states that she has vaginal dryness. Using coconut oil for lubrication.  Feels she has tried many different products that do not help. Has to wait 3 to 4 weeks between sexual encounters due to pain.  Urine sample provided: yes  Last mammogram: 07/10/23 BI-RADS CATEGORY 2: Benign.   GYN HISTORY: As noted  OB History  Gravida Para Term Preterm AB Living  2    2 0  SAB IAB Ectopic Multiple Live Births    2      # Outcome Date GA Lbr Len/2nd Weight Sex Type Anes PTL Lv  2 Ectopic           1 Ectopic            Past Medical History:  Diagnosis Date   BRCA2 positive 07/01/2011   COVID-19 10/2019   Glaucoma    History of cystitis    Hx Breast cancer, IDC, Left, multifocal, Stage II ER+, PR - Her 2- DX 09/04/2005-  S/P TOTAL LEFT MASTECTOMY WITH NODE DISSECTION   AND CHEMO   ONCOLOGIST-  DR RUBIN--  NO RECURRENCE   Hyperlipidemia    Hypertension    Osteopenia 08/2018   T score -1.2 FRAX 4.6% / 0.7%   Past Surgical History:  Procedure Laterality Date   BILATERAL SALPINGOOPHORECTOMY  07/31/2011   Left side removed. Right tube and ovary never identified   BREAST SURGERY  08/29/2005   TOTAL LEFT MASTECTOMY W/ NODE DISSECTION X4   CARPAL TUNNEL RELEASE  2007   RIGHT   CATARACT EXTRACTION     Right eye   CATARACT EXTRACTION Left 07/2020   DILATION AND CURETTAGE OF UTERUS  1968   dx laparoscopy  07/31/2011   resulted into open salpingo-opphorectomy    ECTOPIC PREGNANCY SURGERY  73 & 74   EYE SURGERY  2001   RIGHT EYE FOR GLAUCOMA   HAND SURGERY     HYSTEROSCOPY WITH D & C  11/05/2012   Procedure: DILATATION AND CURETTAGE /HYSTEROSCOPY;  Surgeon: Toribio LITTIE Campanile, MD;  Location: [facility];  Service: Gynecology;  Laterality: N/A;   left eye surgery  2022   PORTA CATH     TRANSTHORACIC ECHOCARDIOGRAM  10/14/2005   LVSF NORMAL/ EF 55-65%/ MILD MITRAL REGURG   Current Outpatient Medications on File Prior to Visit  Medication Sig Dispense Refill   atorvastatin (LIPITOR) 80 MG tablet Take 80 mg by mouth daily.     brimonidine (ALPHAGAN) 0.2 % ophthalmic solution SMARTSIG:In Eye(s)     Cholecalciferol (VITAMIN D ) 1000 UNITS capsule Take 1,000 Units by mouth daily.     dorzolamide-timolol (COSOPT) 22.3-6.8 MG/ML ophthalmic solution Place 1 drop into both eyes 2 (two) times daily.     latanoprost (XALATAN) 0.005 % ophthalmic solution SMARTSIG:In Eye(s)     NIFEdipine (PROCARDIA XL/NIFEDICAL XL) 60 MG 24 hr tablet Take 60 mg by mouth daily.     No current facility-administered medications on file prior to visit.   No Known Allergies    PE Today's Vitals   06/13/24 1422  BP: 122/64  Pulse: 91  Temp: 98.3 F (36.8 C)  TempSrc: Oral  SpO2: 96%  Weight: 116 lb 6.4 oz (52.8 kg)   Body mass index is 19.37 kg/m.  Physical Exam Vitals reviewed. Exam conducted with a chaperone present.  Constitutional:      General: She is not in acute distress.    Appearance: Normal appearance.  HENT:     Head: Normocephalic and atraumatic.     Nose: Nose normal.  Eyes:     Extraocular Movements: Extraocular movements intact.     Conjunctiva/sclera: Conjunctivae normal.  Pulmonary:     Effort: Pulmonary effort is normal.  Genitourinary:    General: Normal vulva.     Exam position: Lithotomy position.     Vagina: Normal. No vaginal discharge.     Cervix: Normal. No cervical motion tenderness, discharge or lesion.     Uterus: Normal. Not enlarged and not tender.      Adnexa: Right adnexa normal and left adnexa normal.  Musculoskeletal:        General: Normal range of motion.     Cervical back: Normal range of motion.   Neurological:     General: No focal deficit present.     Mental Status: She is alert.  Psychiatric:        Mood and Affect: Mood normal.        Behavior: Behavior normal.      Assessment and Plan:        Dyspareunia in female -     WET PREP FOR TRICH, YEAST, CLUE  Vulvar irritation -     WET PREP FOR TRICH, YEAST, CLUE  Dysuria -     Urinalysis,Complete w/RFL Culture -     Urine Culture -     REFLEXIVE URINE CULTURE  Will rule out infectious causes. Recommend using good clean love vaginal daily moisturizer as patient is currently only using lubricants.  Also encouraged application of coconut oil to vulva daily. Will reach out to Dr. Odean, oncologist, to discuss use of vaginal estrogen or DHEA given patient's history. All questions answered   Vera LULLA Pa, MD

## 2024-06-14 LAB — URINALYSIS, COMPLETE W/RFL CULTURE
Glucose, UA: NEGATIVE
Hgb urine dipstick: NEGATIVE
Hyaline Cast: NONE SEEN /LPF
Leukocyte Esterase: NEGATIVE
Nitrites, Initial: NEGATIVE
RBC / HPF: NONE SEEN /HPF (ref 0–2)
Specific Gravity, Urine: 1.025 (ref 1.001–1.035)
pH: 5.5 (ref 5.0–8.0)

## 2024-06-14 LAB — URINE CULTURE
MICRO NUMBER:: 16723756
Result:: NO GROWTH
SPECIMEN QUALITY:: ADEQUATE

## 2024-06-14 LAB — CULTURE INDICATED

## 2024-06-15 ENCOUNTER — Ambulatory Visit: Admitting: Dietician

## 2024-06-15 ENCOUNTER — Ambulatory Visit: Payer: Self-pay | Admitting: Obstetrics and Gynecology

## 2024-06-15 ENCOUNTER — Encounter: Payer: Self-pay | Admitting: Obstetrics and Gynecology

## 2024-06-15 NOTE — Progress Notes (Signed)
 Nutrition Assessment Reached out to patient at home telephone#.  Reason for Assessment: MST screen for weight loss.    ASSESSMENT: Patient is a 78 year old female with PMHx: malignant neoplasm of lower-inner quadrant of left breast in female, estrogen receptor positive (HCC), BRCA2 positive s/p Bilateral salpingo-oophorectomy, s/p Left breast mastectomy.  She currently under surveillance and followed by Dr. Odean.  Referral made for weight loss over past few years and Dr, Odean feels appetite stimulants have side effects and are not suitable for this patient. Patient followed at [facility] for blood pressure fluctuations.  No recent labs other than lipid panel at [facility] back in April 2025 and all WNL.   EMR reflects 11# (8.6%) past year which is not significant for timeframe. However, CBW reflects low BMI.  Patient reports unintentional weight loss, no changes to appetite or portions.   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  She reports she was never a breakfast eater either until she started eating breakfast a year ago.  She is currently only taking Vitamin D , not taking any calcium or MVI.  She has no food allergies. Usual PO:  Breakfast: oatmeal, eggs, toast  Lunch:  sandwich or small burger with lettuce tomato Dinner: BBQ sandwich, fries, with sweet tea Fluids: Coffee  cup, Koolaid, and water, milk at night (3 times a week), Ensure only every once in a while as a meal replacement   Anthropometrics: weight lost 11# (8.6%), 30# past 5 years   Height: 65 Weight:   06/13/24  116.4# 12/08/23   121# 06/09/23  127.3# UBW: 140 (5 years ago) BMI: 19.37    NUTRITION DIAGNOSIS: Inadequate PO intake to meet nutrient needs, r/t not wanting large portions or snacks  INTERVENTION:  Relayed that nutrition services are wrap around service provided at no charge and encouraged continued communication if experiencing continued weight loss or any nutritional  impact symptoms (NIS). Educated on importance of adequate calorie and protein energy intake  with nutrient dense foods when possible to maintain weight/strength Suggested oral nutrition supplement with 350 calories if she used as meal replacement. Encouraged weekly weights. Encouraged 2 glasses of milk each day.  Encouraged 1 6oz cup of Vit C fortified or natural juice daily Contact information provided in text and over the phone, encouraged her to reach out if she was not able to maintain weight.   MONITORING, EVALUATION, GOAL: weight, PO intake, Nutrition Impact Symptoms, labs Goal is weight maintenance  Next Visit: PRN at patient or provider request  Micheline Craven, RDN, LDN Registered Dietitian, [facility] Part Time Remote (Usual office hours: Tuesday-Thursday) Cell: [phone]

## 2024-06-20 LAB — WET PREP FOR TRICH, YEAST, CLUE

## 2024-07-12 ENCOUNTER — Ambulatory Visit
Admission: RE | Admit: 2024-07-12 | Discharge: 2024-07-12 | Disposition: A | Discharge status: Home / Self Care | Source: Ambulatory Visit | Attending: Hematology and Oncology | Admitting: Hematology and Oncology

## 2024-07-12 DIAGNOSIS — Z170 Estrogen receptor positive status [ER+]: Secondary | ICD-10-CM

## 2024-07-12 DIAGNOSIS — Z1501 Genetic susceptibility to malignant neoplasm of breast: Secondary | ICD-10-CM

## 2024-07-12 MED ORDER — GADOPICLENOL 0.5 MMOL/ML IV SOLN
5.0000 mL | Freq: Once | INTRAVENOUS | Status: AC | PRN
  Administered 2024-07-12: 5 mL via INTRAVENOUS

## 2024-12-29 ENCOUNTER — Other Ambulatory Visit: Payer: Self-pay

## 2024-12-29 ENCOUNTER — Emergency Department (HOSPITAL_BASED_OUTPATIENT_CLINIC_OR_DEPARTMENT_OTHER): Admitting: Radiology

## 2024-12-29 ENCOUNTER — Emergency Department (HOSPITAL_BASED_OUTPATIENT_CLINIC_OR_DEPARTMENT_OTHER)
Admission: EM | Admit: 2024-12-29 | Discharge: 2024-12-29 | Disposition: A | Discharge status: Home / Self Care | Source: Home / Self Care | Attending: Emergency Medicine | Admitting: Emergency Medicine

## 2024-12-29 ENCOUNTER — Encounter (HOSPITAL_BASED_OUTPATIENT_CLINIC_OR_DEPARTMENT_OTHER): Payer: Self-pay

## 2024-12-29 ENCOUNTER — Emergency Department (HOSPITAL_BASED_OUTPATIENT_CLINIC_OR_DEPARTMENT_OTHER)

## 2024-12-29 DIAGNOSIS — M25512 Pain in left shoulder: Secondary | ICD-10-CM

## 2024-12-29 DIAGNOSIS — W009XXA Unspecified fall due to ice and snow, initial encounter: Secondary | ICD-10-CM

## 2024-12-29 DIAGNOSIS — S0003XA Contusion of scalp, initial encounter: Secondary | ICD-10-CM

## 2024-12-29 NOTE — ED Triage Notes (Signed)
 POV, PT complaints of head injury, pt was walking on icy side walk, slip down and hit her head backwards.  Pt report left shoulder pain, present before fall but became worst after it.   Alert and oriented x4, pupils equal, round and reactive.

## 2024-12-29 NOTE — ED Notes (Signed)
 Reviewed AVS/discharge instructions with patient. Time allotted for and all questions answered. Patient is agreeable for d/c and escorted to ED exit by staff.

## 2024-12-29 NOTE — Discharge Instructions (Addendum)
 No signs of internal bleeding or broken bones on the x-rays and CAT scan.  You are probably gonna be pretty sore tomorrow so you could try to get some over-the-counter Voltaren gel and use Tylenol .  If you start having severe headaches, persistent vomiting, confusion return to the emergency room.

## 2024-12-29 NOTE — ED Provider Notes (Signed)
 " [place] EMERGENCY DEPARTMENT AT [facility] Provider Note   CSN: [id number] Arrival date & time: 12/29/24  2023     Patient presents with: Fall and Head Injury   [name] is a 79 y.o. female.   Patient is an 79 year old female with a history of hypertension, hyperlipidemia who does not take any anticoagulation presenting today after a fall.  Patient was walking into her house and slipped on the ice causing her to fall backward hitting her head on the concrete.  She denies loss of consciousness but was shaken up for a brief time right after the fall.  Since the fall which was just prior to arrival she started noticing more pain in her left shoulder which was already hurting her prior to the fall as well as pain in the back of her head.  She denies any neck pain, nausea or vomiting.  No pain in her legs or her right arm.  The history is provided by the patient and medical records.  Fall  Head Injury      Prior to Admission medications  Medication Sig Start Date End Date Taking? Authorizing Provider  atorvastatin (LIPITOR) 80 MG tablet Take 80 mg by mouth daily.    [provider]  brimonidine (ALPHAGAN) 0.2 % ophthalmic solution SMARTSIG:In Eye(s) 07/20/21   [provider]  Cholecalciferol (VITAMIN D ) 1000 UNITS capsule Take 1,000 Units by mouth daily.    [provider]  dorzolamide-timolol (COSOPT) 22.3-6.8 MG/ML ophthalmic solution Place 1 drop into both eyes 2 (two) times daily.    [provider]  latanoprost (XALATAN) 0.005 % ophthalmic solution SMARTSIG:In Eye(s) 06/17/21   [provider]  NIFEdipine (PROCARDIA XL/NIFEDICAL XL) 60 MG 24 hr tablet Take 60 mg by mouth daily.    [provider]    Allergies: Patient has no known allergies.    Review of Systems  Updated Vital Signs BP (!) 165/84 (BP Location: Right Arm)   Pulse 98   Temp 98.8 F (37.1 C) (Oral)   Resp 16   SpO2 100%   Physical  Exam Vitals and nursing note reviewed.  Constitutional:      General: She is not in acute distress.    Appearance: She is well-developed.  HENT:     Head: Normocephalic and atraumatic.   Eyes:     Pupils: Pupils are equal, round, and reactive to light.  Cardiovascular:     Rate and Rhythm: Normal rate and regular rhythm.     Heart sounds: Normal heart sounds. No murmur heard.    No friction rub.  Pulmonary:     Effort: Pulmonary effort is normal.     Breath sounds: Normal breath sounds. No wheezing or rales.  Abdominal:     General: Bowel sounds are normal. There is no distension.     Palpations: Abdomen is soft.     Tenderness: There is no abdominal tenderness. There is no guarding or rebound.  Musculoskeletal:        General: Tenderness present. Normal range of motion.     Right shoulder: Normal.     Left shoulder: Bony tenderness present.     Cervical back: Normal range of motion and neck supple.     Comments: No edema.  Tenderness over the humeral head  Skin:    General: Skin is warm and dry.     Findings: No rash.  Neurological:     Mental Status: She is alert and oriented to person,  place, and time. Mental status is at baseline.     Cranial Nerves: No cranial nerve deficit.     Sensory: No sensory deficit.     Motor: No weakness.     Gait: Gait normal.  Psychiatric:        Mood and Affect: Mood normal.        Behavior: Behavior normal.     (all labs ordered are listed, but only abnormal results are displayed) Labs Reviewed - No data to display  EKG: None  Radiology: DG Shoulder Left Result Date: 12/29/2024 EXAM: 2 VIEW(S) XRAY OF THE LEFT SHOULDER 12/29/2024 08:55:00 PM COMPARISON: None available. CLINICAL HISTORY: Pain after fall. FINDINGS: BONES AND JOINTS: Glenohumeral joint is normally aligned. No acute fracture. No malalignment. [facility] joint shows mild degenerative changes. SOFT TISSUES: No abnormal calcifications. Visualized lung is unremarkable.  IMPRESSION: 1. No acute fracture or dislocation. Electronically signed by: Pinkie Pebbles MD 12/29/2024 09:02 PM EST RP Workstation: [id number]   CT Cervical Spine Wo Contrast Result Date: 12/29/2024 EXAM: CT CERVICAL SPINE WITHOUT CONTRAST 12/29/2024 08:47:32 PM TECHNIQUE: CT of the cervical spine was performed without the administration of intravenous contrast. Multiplanar reformatted images are provided for review. Automated exposure control, iterative reconstruction, and/or weight based adjustment of the mA/kV was utilized to reduce the radiation dose to as low as reasonably achievable. COMPARISON: None available. CLINICAL HISTORY: fall with head injury and elderly. Fall with head injury; elderly. FINDINGS: BONES AND ALIGNMENT: No acute fracture or traumatic malalignment. DEGENERATIVE CHANGES: Multilevel spondylosis, disc space height loss, and degenerative endplate changes greatest at C5-C6 where it is advanced. Ankylosis of C6-C7. Multilevel moderate facet arthropathy. No severe spinal canal narrowing. SOFT TISSUES: No prevertebral soft tissue swelling. IMPRESSION: 1. No acute cervical spine findings. Electronically signed by: Norman Gatlin MD 12/29/2024 08:54 PM EST RP Workstation: [id number]   CT Head Wo Contrast Result Date: 12/29/2024 EXAM: CT HEAD WITHOUT CONTRAST 12/29/2024 08:47:32 PM TECHNIQUE: CT of the head was performed without the administration of intravenous contrast. Automated exposure control, iterative reconstruction, and/or weight based adjustment of the mA/kV was utilized to reduce the radiation dose to as low as reasonably achievable. COMPARISON: 3 / 6 / 20 CLINICAL HISTORY: Minor head trauma (age >= 65 years). FINDINGS: BRAIN AND VENTRICLES: No acute hemorrhage. No evidence of acute infarct. No hydrocephalus. No extra-axial collection. No mass effect or midline shift. Bilateral lens replacement. Left scleral buckle. SINUSES: No acute abnormality. SOFT TISSUES AND SKULL: No acute  soft tissue abnormality. No skull fracture. IMPRESSION: 1. No acute intracranial abnormality. Electronically signed by: Norman Gatlin MD 12/29/2024 08:52 PM EST RP Workstation: [id number]     Procedures   Medications Ordered in the ED - No data to display                                  Medical Decision Making Amount and/or Complexity of Data Reviewed Radiology: ordered and independent interpretation performed. Decision-making details documented in ED Course.   Pt with multiple medical problems and comorbidities and presenting today with a complaint that caries a high risk for morbidity and mortality.  Here today after a fall.  Patient did sustain a head injury where her head hit the concrete when she fell backwards.  Fall was from slipping on ice.  Patient is neurovascularly intact at this time.  She does not take anticoagulation.  No lacerations are present.  Patient also is having  left shoulder pain.  She was having left shoulder pain prior to this but now it is much worse after the fall.  She declined Tylenol .  Imaging is pending.  9:09 PM I have independently visualized and interpreted pt's images today.  Head CT negative for bleed and cervical spine without fracture.  Radiology reports no acute findings.  Shoulder image negative for fracture.  All of this was discussed with the patient.  She will be discharged home with supportive measures.  She was given return precautions       Final diagnoses:  Fall from slipping on ice, initial encounter  Contusion of scalp, initial encounter  Acute pain of left shoulder    ED Discharge Orders     None          Doretha Folks, MD 12/29/24 2109  "

## 2025-06-12 ENCOUNTER — Ambulatory Visit: Admitting: Hematology and Oncology
